# Patient Record
Sex: Female | Born: 1950
Health system: Southern US, Community
[De-identification: ages and names within clinical notes are randomized; demographics above are authoritative.]

## PROBLEM LIST (undated history)

## (undated) DIAGNOSIS — M21969 Unspecified acquired deformity of unspecified lower leg: Secondary | ICD-10-CM

## (undated) DIAGNOSIS — T7840XA Allergy, unspecified, initial encounter: Secondary | ICD-10-CM

## (undated) DIAGNOSIS — M7730 Calcaneal spur, unspecified foot: Secondary | ICD-10-CM

## (undated) DIAGNOSIS — E785 Hyperlipidemia, unspecified: Secondary | ICD-10-CM

## (undated) DIAGNOSIS — M1990 Unspecified osteoarthritis, unspecified site: Secondary | ICD-10-CM

## (undated) DIAGNOSIS — I10 Essential (primary) hypertension: Secondary | ICD-10-CM

## (undated) HISTORY — DX: Hyperlipidemia, unspecified: E78.5

## (undated) HISTORY — DX: Unspecified osteoarthritis, unspecified site: M19.90

## (undated) HISTORY — DX: Essential (primary) hypertension: I10

## (undated) HISTORY — PX: OTHER SURGICAL HISTORY: SHX169

## (undated) HISTORY — DX: Calcaneal spur, unspecified foot: M77.30

## (undated) HISTORY — DX: Unspecified acquired deformity of unspecified lower leg: M21.969

## (undated) HISTORY — DX: Allergy, unspecified, initial encounter: T78.40XA

---

## 1998-04-03 ENCOUNTER — Other Ambulatory Visit: Admission: RE | Admit: 1998-04-03 | Discharge: 1998-04-03 | Payer: Self-pay | Admitting: Obstetrics and Gynecology

## 1999-09-13 ENCOUNTER — Other Ambulatory Visit: Admission: RE | Admit: 1999-09-13 | Discharge: 1999-09-13 | Payer: Self-pay | Admitting: Obstetrics and Gynecology

## 2001-06-29 ENCOUNTER — Other Ambulatory Visit: Admission: RE | Admit: 2001-06-29 | Discharge: 2001-06-29 | Payer: Self-pay | Admitting: Neurosurgery

## 2002-10-15 ENCOUNTER — Other Ambulatory Visit: Admission: RE | Admit: 2002-10-15 | Discharge: 2002-10-15 | Payer: Self-pay | Admitting: Internal Medicine

## 2003-10-03 ENCOUNTER — Encounter: Payer: Self-pay | Admitting: Internal Medicine

## 2003-10-20 ENCOUNTER — Other Ambulatory Visit: Admission: RE | Admit: 2003-10-20 | Discharge: 2003-10-20 | Payer: Self-pay | Admitting: Internal Medicine

## 2004-10-24 ENCOUNTER — Ambulatory Visit: Payer: Self-pay | Admitting: Internal Medicine

## 2004-11-01 ENCOUNTER — Ambulatory Visit: Payer: Self-pay | Admitting: Internal Medicine

## 2005-01-31 ENCOUNTER — Other Ambulatory Visit: Admission: RE | Admit: 2005-01-31 | Discharge: 2005-01-31 | Payer: Self-pay | Admitting: Internal Medicine

## 2005-01-31 ENCOUNTER — Ambulatory Visit: Payer: Self-pay | Admitting: Internal Medicine

## 2005-02-08 ENCOUNTER — Encounter: Admission: RE | Admit: 2005-02-08 | Discharge: 2005-02-08 | Payer: Self-pay | Admitting: Internal Medicine

## 2005-02-19 ENCOUNTER — Encounter: Admission: RE | Admit: 2005-02-19 | Discharge: 2005-02-19 | Payer: Self-pay | Admitting: Internal Medicine

## 2005-05-08 ENCOUNTER — Ambulatory Visit: Payer: Self-pay | Admitting: Internal Medicine

## 2005-08-30 ENCOUNTER — Encounter: Admission: RE | Admit: 2005-08-30 | Discharge: 2005-08-30 | Payer: Self-pay | Admitting: Internal Medicine

## 2005-11-20 ENCOUNTER — Ambulatory Visit: Payer: Self-pay | Admitting: Internal Medicine

## 2005-12-04 ENCOUNTER — Other Ambulatory Visit: Admission: RE | Admit: 2005-12-04 | Discharge: 2005-12-04 | Payer: Self-pay | Admitting: Internal Medicine

## 2005-12-04 ENCOUNTER — Encounter: Payer: Self-pay | Admitting: Internal Medicine

## 2005-12-04 ENCOUNTER — Ambulatory Visit: Payer: Self-pay | Admitting: Internal Medicine

## 2006-01-24 ENCOUNTER — Ambulatory Visit: Payer: Self-pay | Admitting: Internal Medicine

## 2006-02-17 ENCOUNTER — Encounter: Payer: Self-pay | Admitting: Internal Medicine

## 2006-02-17 ENCOUNTER — Ambulatory Visit: Payer: Self-pay | Admitting: Internal Medicine

## 2006-02-17 LAB — HM COLONOSCOPY

## 2006-05-15 ENCOUNTER — Ambulatory Visit: Payer: Self-pay | Admitting: Internal Medicine

## 2006-07-09 ENCOUNTER — Ambulatory Visit: Payer: Self-pay | Admitting: Internal Medicine

## 2006-07-21 IMAGING — US US TRANSVAGINAL NON-OB
1 series · 14 of 25 positions shown · non-contrast
Comparison: none

CLINICAL DATA: History of lobular uterus on CT suggestive of fibroid. 
 TRANSABDOMINAL AND TRANSVAGINAL PELVIC ULTRASOUND:
TECHNIQUE: Both transabdominal and transvaginal ultrasound examinations of the pelvis were performed including evaluation of the uterus, ovaries, adnexal regions, and pelvic cul-de-sac.

[Series 1: unknown · 0.32mm/px · 14 of 41 slices shown]
[im 1/41]
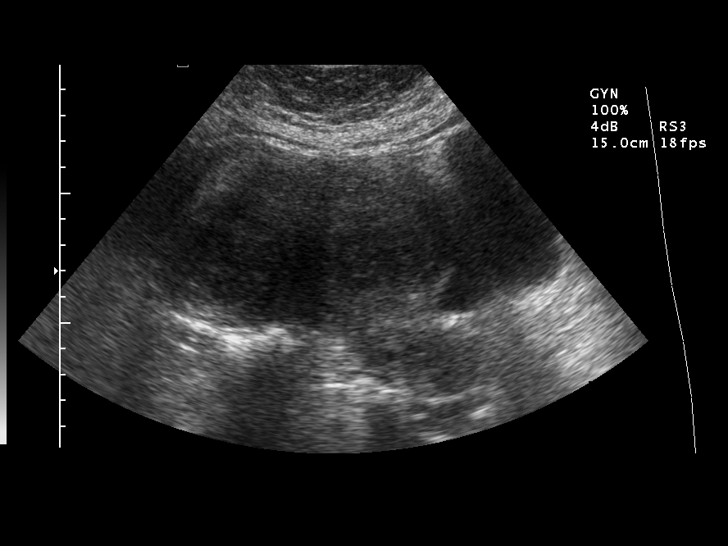
[im 4/41]
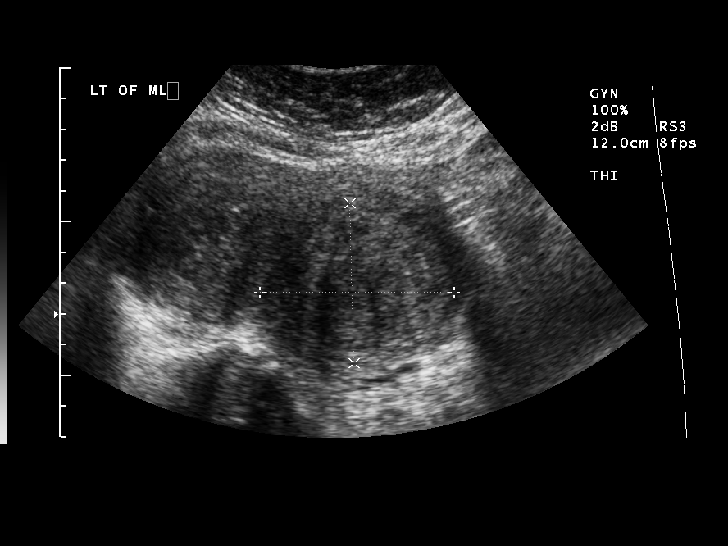
[im 7/41]
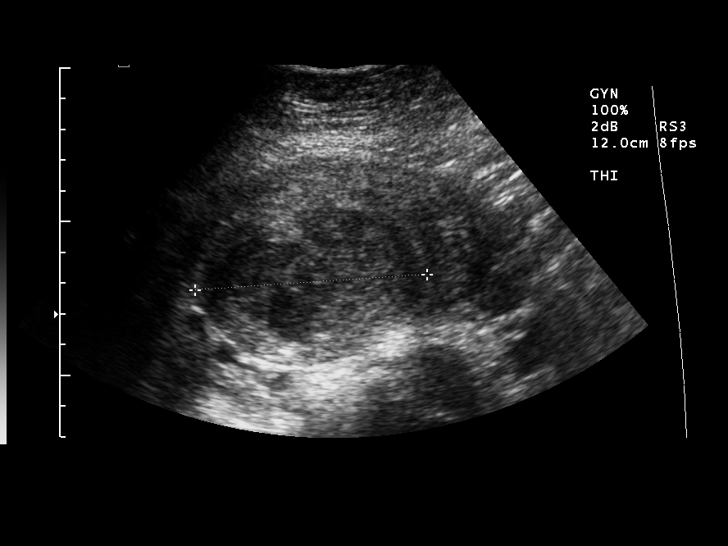
[im 11/41]
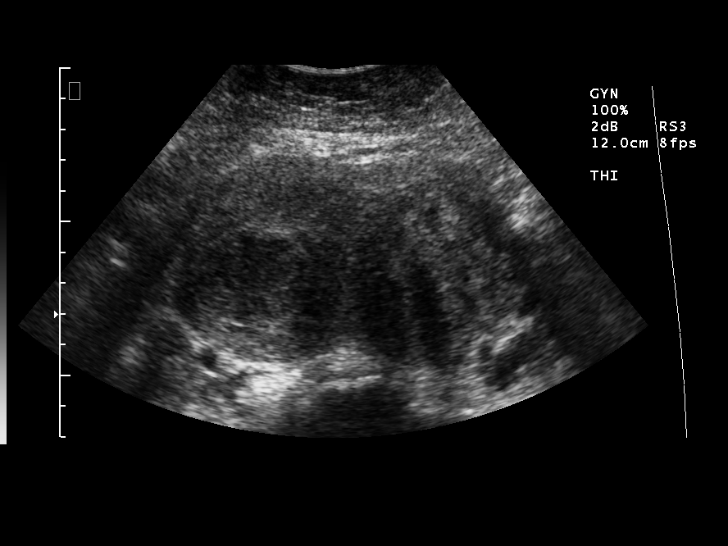
[im 14/41]
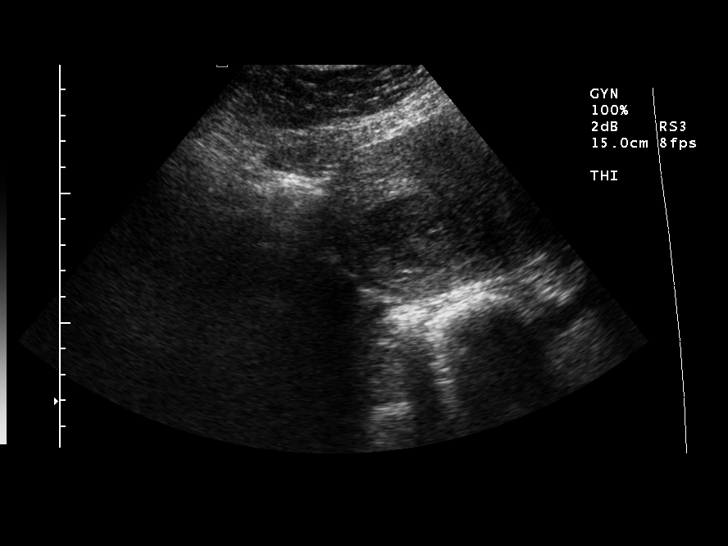
[im 16/41]
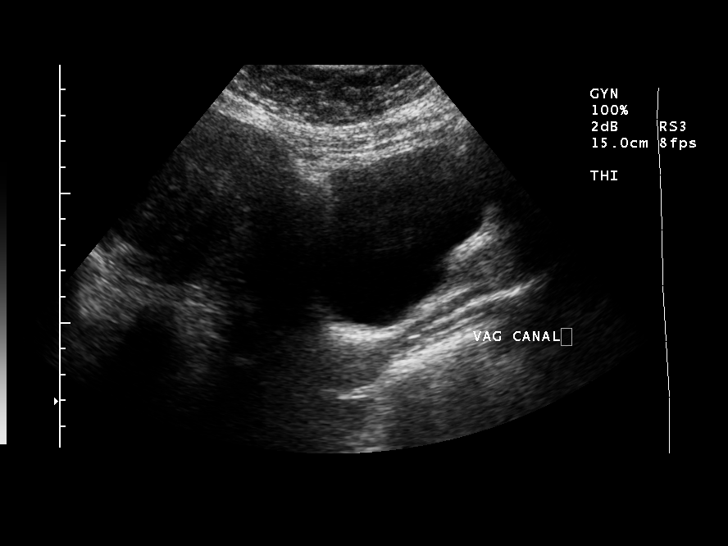
[im 19/41]
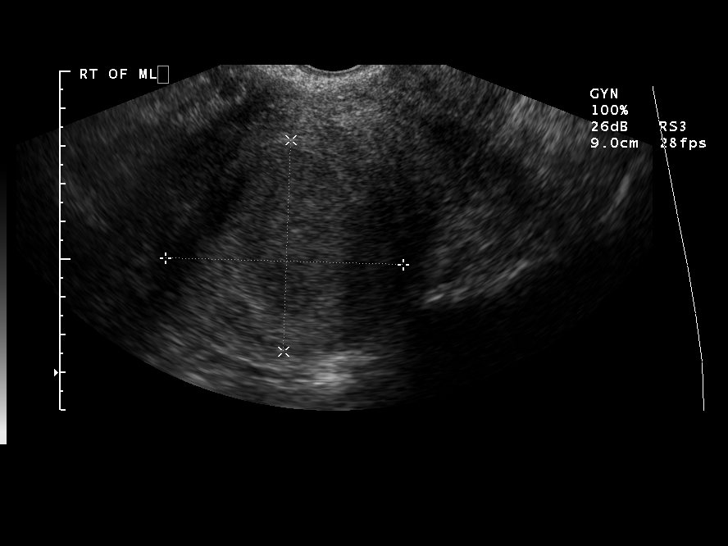
[im 22/41]
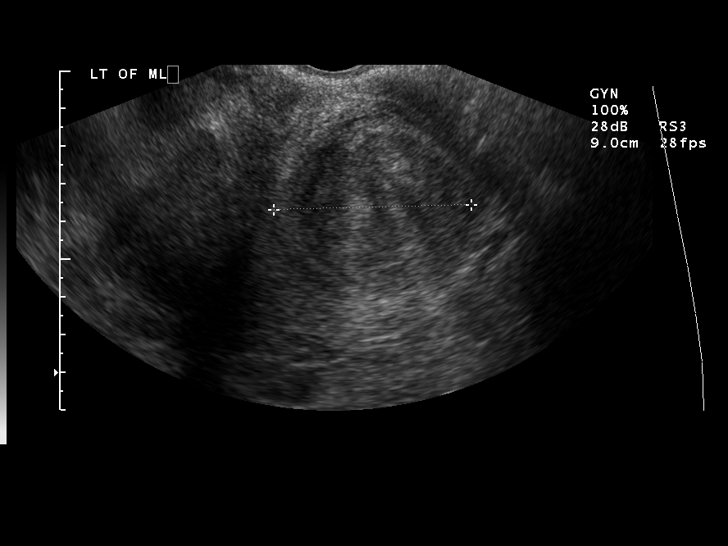
[im 26/41]
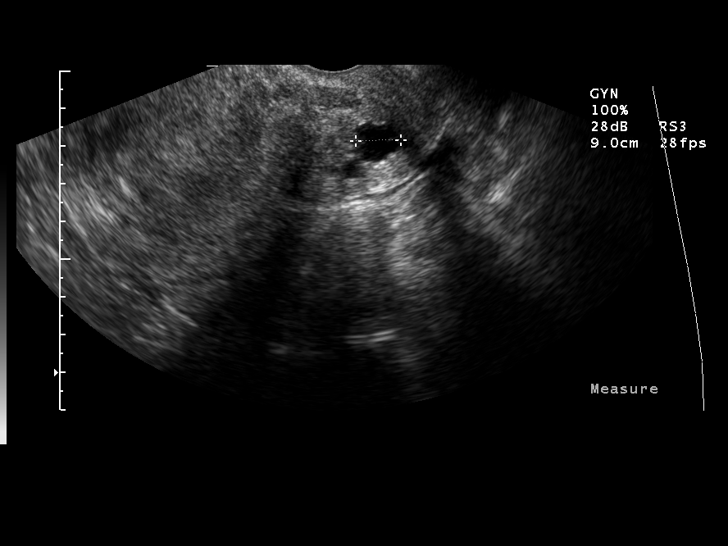
[im 27/41]
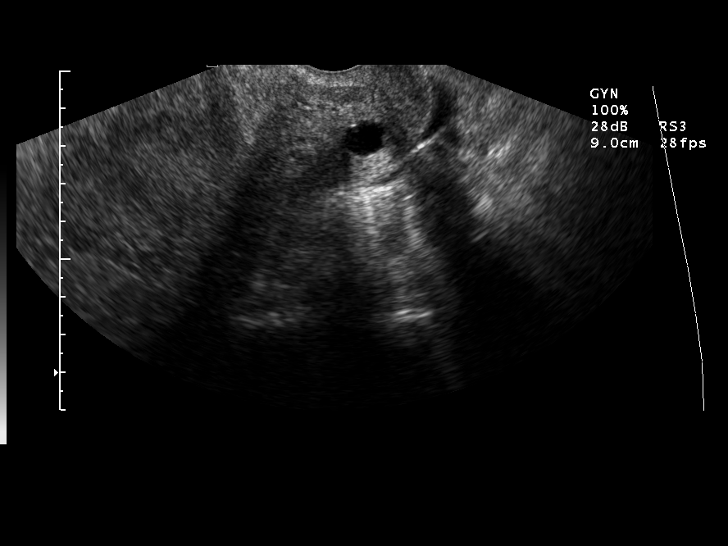
[im 31/41]
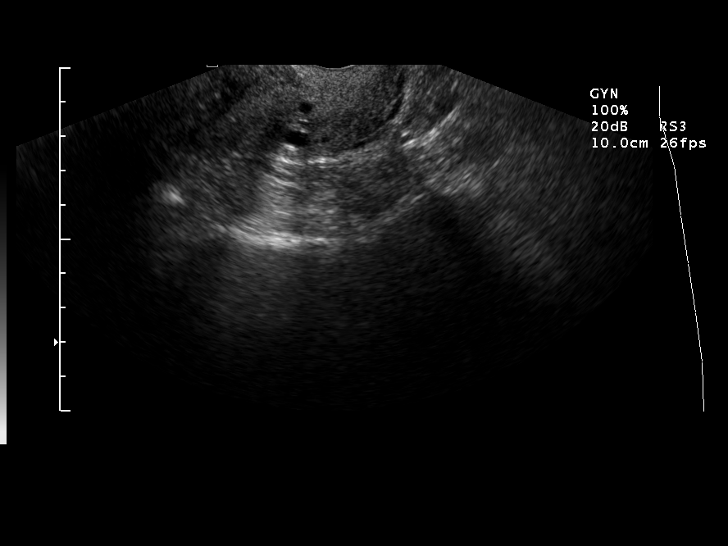
[im 34/41]
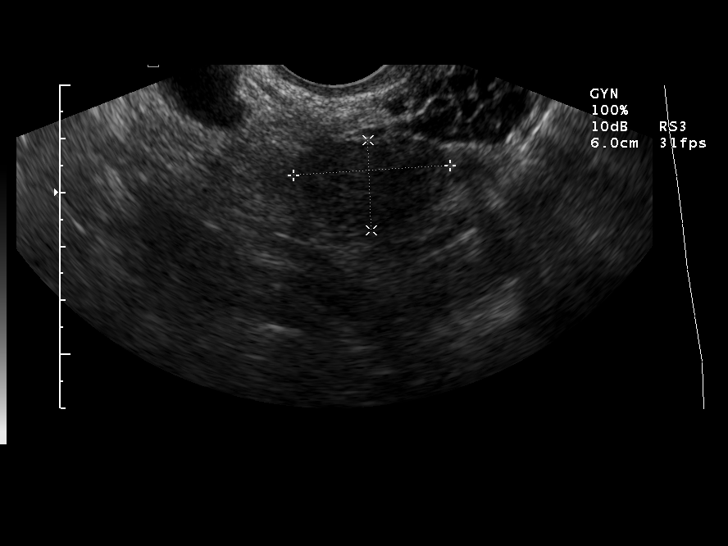
[im 37/41]
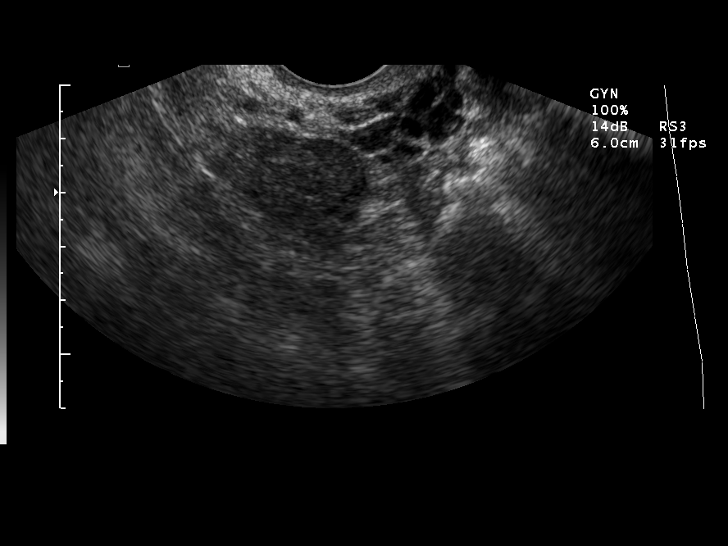
[im 41/41]
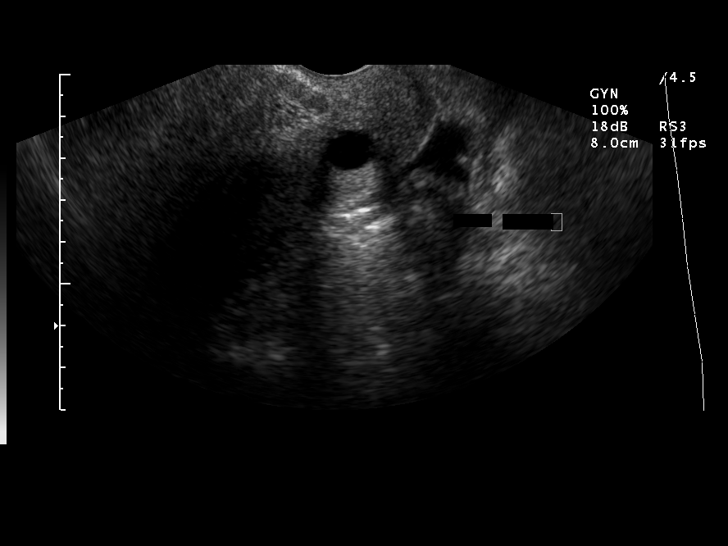

[14 of 25 positions shown; findings below may reference images not displayed]

FINDINGS: The uterus is noted to be enlarged and lobular.  The uterus measures 13.7 cm sagittally with a depth of 7.3 cm and width of 11.2 cm.  At least two fibroids are present.  One of these fibroids is in the left lateral body measuring 6.1 x 5.4 x 5.2 cm with another in the right fundus measuring 6.3 x 5.6 x 6.2 cm.  Endometrium is grossly normal measuring 8.5 mm.  The ovaries are normal in size and only a small amount of free fluid is noted.
IMPRESSION: At least two uterine fibroids as described above.  Ovaries appear normal.

## 2006-08-20 ENCOUNTER — Ambulatory Visit: Payer: Self-pay | Admitting: Internal Medicine

## 2007-01-12 ENCOUNTER — Ambulatory Visit: Payer: Self-pay | Admitting: Internal Medicine

## 2007-01-12 LAB — CONVERTED CEMR LAB
AST: 15 units/L (ref 0–37)
Albumin: 4.1 g/dL (ref 3.5–5.2)
Alkaline Phosphatase: 40 units/L (ref 39–117)
BUN: 13 mg/dL (ref 6–23)
Basophils Absolute: 0 10*3/uL (ref 0.0–0.1)
Bilirubin, Direct: 0.1 mg/dL (ref 0.0–0.3)
Calcium: 9.3 mg/dL (ref 8.4–10.5)
Chloride: 110 meq/L (ref 96–112)
HCT: 40.5 % (ref 36.0–46.0)
HDL: 66.9 mg/dL (ref >39.0)
Hemoglobin: 13.9 g/dL (ref 12.0–15.0)
Lymphocytes Relative: 24.9 % (ref 12.0–46.0)
MCV: 91.8 fL (ref 78.0–100.0)
Neutro Abs: 3.6 10*3/uL (ref 1.4–7.7)
Platelets: 194 10*3/uL (ref 150–400)
Sodium: 144 meq/L (ref 135–145)
Total Protein: 6.6 g/dL (ref 6.0–8.3)
Triglycerides: 164 mg/dL — ABNORMAL HIGH (ref 0–149)
WBC: 5.6 10*3/uL (ref 4.5–10.5)

## 2007-01-19 ENCOUNTER — Other Ambulatory Visit: Admission: RE | Admit: 2007-01-19 | Discharge: 2007-01-19 | Payer: Self-pay | Admitting: Neurosurgery

## 2007-01-19 ENCOUNTER — Encounter: Payer: Self-pay | Admitting: Internal Medicine

## 2007-01-19 ENCOUNTER — Ambulatory Visit: Payer: Self-pay | Admitting: Internal Medicine

## 2007-02-26 DIAGNOSIS — E785 Hyperlipidemia, unspecified: Secondary | ICD-10-CM | POA: Insufficient documentation

## 2007-04-14 ENCOUNTER — Ambulatory Visit: Payer: Self-pay | Admitting: Internal Medicine

## 2007-04-14 LAB — CONVERTED CEMR LAB
Direct LDL: 130.7 mg/dL
HDL: 66 mg/dL (ref >39.0)
Triglycerides: 47 mg/dL (ref 0–149)

## 2007-04-21 ENCOUNTER — Ambulatory Visit: Payer: Self-pay | Admitting: Internal Medicine

## 2007-04-21 DIAGNOSIS — I10 Essential (primary) hypertension: Secondary | ICD-10-CM | POA: Insufficient documentation

## 2007-04-21 LAB — CONVERTED CEMR LAB: Cholesterol, target level: 200 mg/dL

## 2007-05-01 ENCOUNTER — Telehealth: Payer: Self-pay | Admitting: Internal Medicine

## 2007-05-04 ENCOUNTER — Telehealth: Payer: Self-pay | Admitting: *Deleted

## 2007-05-05 ENCOUNTER — Ambulatory Visit: Payer: Self-pay | Admitting: Internal Medicine

## 2007-05-05 DIAGNOSIS — J309 Allergic rhinitis, unspecified: Secondary | ICD-10-CM | POA: Insufficient documentation

## 2007-07-03 ENCOUNTER — Ambulatory Visit: Payer: Self-pay | Admitting: Internal Medicine

## 2007-07-03 LAB — CONVERTED CEMR LAB
Basophils Relative: 0.3 % (ref 0.0–1.0)
MCHC: 34.9 g/dL (ref 30.0–36.0)
MCV: 90.1 fL (ref 78.0–100.0)
Monocytes Absolute: 0.5 10*3/uL (ref 0.2–0.7)
Monocytes Relative: 9.7 % (ref 3.0–11.0)
Neutro Abs: 3.3 10*3/uL (ref 1.4–7.7)
Platelets: 165 10*3/uL (ref 150–400)
RDW: 12.2 % (ref 11.5–14.6)
WBC: 5.5 10*3/uL (ref 4.5–10.5)

## 2007-08-26 ENCOUNTER — Ambulatory Visit: Payer: Self-pay | Admitting: Internal Medicine

## 2007-10-07 ENCOUNTER — Ambulatory Visit: Payer: Self-pay | Admitting: Internal Medicine

## 2007-10-19 ENCOUNTER — Ambulatory Visit: Payer: Self-pay | Admitting: Internal Medicine

## 2007-10-19 LAB — CONVERTED CEMR LAB
Total CHOL/HDL Ratio: 3.3
Triglycerides: 135 mg/dL (ref 0–149)

## 2007-10-26 ENCOUNTER — Ambulatory Visit: Payer: Self-pay | Admitting: Internal Medicine

## 2007-10-26 DIAGNOSIS — M1990 Unspecified osteoarthritis, unspecified site: Secondary | ICD-10-CM | POA: Insufficient documentation

## 2008-03-09 ENCOUNTER — Ambulatory Visit: Payer: Self-pay | Admitting: Internal Medicine

## 2008-03-09 LAB — CONVERTED CEMR LAB
ALT: 16 units/L (ref 0–35)
AST: 16 units/L (ref 0–37)
Albumin: 4.3 g/dL (ref 3.5–5.2)
Basophils Absolute: 0 10*3/uL (ref 0.0–0.1)
Bilirubin, Direct: 0.1 mg/dL (ref 0.0–0.3)
Chloride: 104 meq/L (ref 96–112)
Cholesterol: 233 mg/dL (ref 0–200)
Eosinophils Absolute: 0.2 10*3/uL (ref 0.0–0.7)
Eosinophils Relative: 3.7 % (ref 0.0–5.0)
GFR calc non Af Amer: 79 mL/min
HCT: 39.9 % (ref 36.0–46.0)
MCHC: 35 g/dL (ref 30.0–36.0)
MCV: 91 fL (ref 78.0–100.0)
Neutro Abs: 2.4 10*3/uL (ref 1.4–7.7)
Total CHOL/HDL Ratio: 3.8
Triglycerides: 126 mg/dL (ref 0–149)
VLDL: 25 mg/dL (ref 0–40)
WBC: 4.1 10*3/uL — ABNORMAL LOW (ref 4.5–10.5)
pH: 5

## 2008-03-23 ENCOUNTER — Ambulatory Visit: Payer: Self-pay | Admitting: Internal Medicine

## 2008-03-23 ENCOUNTER — Other Ambulatory Visit: Admission: RE | Admit: 2008-03-23 | Discharge: 2008-03-23 | Payer: Self-pay | Admitting: Internal Medicine

## 2008-03-23 ENCOUNTER — Encounter: Payer: Self-pay | Admitting: Internal Medicine

## 2008-04-25 ENCOUNTER — Encounter: Payer: Self-pay | Admitting: Internal Medicine

## 2008-06-29 ENCOUNTER — Encounter: Payer: Self-pay | Admitting: Internal Medicine

## 2008-08-02 ENCOUNTER — Ambulatory Visit: Payer: Self-pay | Admitting: Internal Medicine

## 2008-08-02 LAB — CONVERTED CEMR LAB
AST: 25 units/L (ref 0–37)
Bilirubin, Direct: 0.1 mg/dL (ref 0.0–0.3)
Cholesterol: 266 mg/dL (ref 0–200)
Direct LDL: 167.6 mg/dL
Total CHOL/HDL Ratio: 3.8
VLDL: 26 mg/dL (ref 0–40)

## 2008-08-09 ENCOUNTER — Ambulatory Visit: Payer: Self-pay | Admitting: Internal Medicine

## 2008-10-04 ENCOUNTER — Ambulatory Visit: Payer: Self-pay | Admitting: Internal Medicine

## 2008-11-08 ENCOUNTER — Ambulatory Visit: Payer: Self-pay | Admitting: Internal Medicine

## 2008-11-08 LAB — CONVERTED CEMR LAB
Albumin: 4.1 g/dL (ref 3.5–5.2)
Alkaline Phosphatase: 29 units/L — ABNORMAL LOW (ref 39–117)
Cholesterol: 229 mg/dL — ABNORMAL HIGH (ref 0–200)
Direct LDL: 149.9 mg/dL
HDL: 64.2 mg/dL (ref >39.00)
Total CHOL/HDL Ratio: 4
VLDL: 15.8 mg/dL (ref 0.0–40.0)
Vit D, 25-Hydroxy: 32 ng/mL (ref 30–89)

## 2008-11-15 ENCOUNTER — Encounter: Payer: Self-pay | Admitting: Internal Medicine

## 2008-11-15 ENCOUNTER — Ambulatory Visit: Payer: Self-pay | Admitting: Internal Medicine

## 2008-11-17 ENCOUNTER — Encounter: Payer: Self-pay | Admitting: Internal Medicine

## 2009-03-22 ENCOUNTER — Ambulatory Visit: Payer: Self-pay | Admitting: Internal Medicine

## 2009-03-22 LAB — CONVERTED CEMR LAB
Albumin: 4.3 g/dL (ref 3.5–5.2)
Basophils Absolute: 0 10*3/uL (ref 0.0–0.1)
Eosinophils Absolute: 0.2 10*3/uL (ref 0.0–0.7)
Glucose, Urine, Semiquant: NEGATIVE
HCT: 39 % (ref 36.0–46.0)
HDL: 62.5 mg/dL (ref >39.00)
Hemoglobin: 13.6 g/dL (ref 12.0–15.0)
Ketones, urine, test strip: NEGATIVE
Lymphocytes Relative: 24.3 % (ref 12.0–46.0)
MCV: 91.7 fL (ref 78.0–100.0)
Neutro Abs: 2.7 10*3/uL (ref 1.4–7.7)
Neutrophils Relative %: 63.6 % (ref 43.0–77.0)
Platelets: 178 10*3/uL (ref 150.0–400.0)
Potassium: 3.5 meq/L (ref 3.5–5.1)
RBC: 4.25 M/uL (ref 3.87–5.11)
Sodium: 144 meq/L (ref 135–145)
Total Bilirubin: 1.2 mg/dL (ref 0.3–1.2)
Total CHOL/HDL Ratio: 4
Total Protein: 6.8 g/dL (ref 6.0–8.3)
Triglycerides: 104 mg/dL (ref 0.0–149.0)
Urobilinogen, UA: 0.2
VLDL: 20.8 mg/dL (ref 0.0–40.0)
WBC: 4.3 10*3/uL — ABNORMAL LOW (ref 4.5–10.5)

## 2009-04-04 ENCOUNTER — Other Ambulatory Visit: Admission: RE | Admit: 2009-04-04 | Discharge: 2009-04-04 | Payer: Self-pay | Admitting: Neurosurgery

## 2009-04-04 ENCOUNTER — Encounter: Payer: Self-pay | Admitting: Internal Medicine

## 2009-04-04 ENCOUNTER — Ambulatory Visit: Payer: Self-pay | Admitting: Internal Medicine

## 2009-05-01 ENCOUNTER — Encounter: Payer: Self-pay | Admitting: Internal Medicine

## 2009-05-08 ENCOUNTER — Telehealth: Payer: Self-pay | Admitting: Internal Medicine

## 2009-05-10 ENCOUNTER — Ambulatory Visit: Payer: Self-pay | Admitting: Internal Medicine

## 2009-06-14 ENCOUNTER — Ambulatory Visit: Payer: Self-pay | Admitting: Internal Medicine

## 2009-06-14 LAB — CONVERTED CEMR LAB
ALT: 18 units/L (ref 0–35)
Alkaline Phosphatase: 39 units/L (ref 39–117)
Bilirubin, Direct: 0 mg/dL (ref 0.0–0.3)
Total Bilirubin: 0.6 mg/dL (ref 0.3–1.2)
Total CHOL/HDL Ratio: 3
Total Protein: 6.7 g/dL (ref 6.0–8.3)
Triglycerides: 70 mg/dL (ref 0.0–149.0)

## 2009-06-22 ENCOUNTER — Ambulatory Visit: Payer: Self-pay | Admitting: Internal Medicine

## 2009-07-21 ENCOUNTER — Ambulatory Visit: Payer: Self-pay | Admitting: Internal Medicine

## 2009-07-21 DIAGNOSIS — J329 Chronic sinusitis, unspecified: Secondary | ICD-10-CM | POA: Insufficient documentation

## 2009-10-04 ENCOUNTER — Ambulatory Visit: Payer: Self-pay | Admitting: Internal Medicine

## 2010-03-29 ENCOUNTER — Ambulatory Visit: Payer: Self-pay | Admitting: Internal Medicine

## 2010-03-29 LAB — CONVERTED CEMR LAB
AST: 15 units/L (ref 0–37)
BUN: 23 mg/dL (ref 6–23)
Basophils Relative: 0.6 % (ref 0.0–3.0)
Bilirubin Urine: NEGATIVE
Blood in Urine, dipstick: NEGATIVE
Calcium: 9.3 mg/dL (ref 8.4–10.5)
Cholesterol: 199 mg/dL (ref 0–200)
Eosinophils Relative: 3.3 % (ref 0.0–5.0)
GFR calc non Af Amer: 84.13 mL/min (ref >60)
Glucose, Bld: 88 mg/dL (ref 70–99)
Hemoglobin: 12.9 g/dL (ref 12.0–15.0)
Ketones, urine, test strip: NEGATIVE
Lymphs Abs: 1.6 10*3/uL (ref 0.7–4.0)
MCHC: 33.9 g/dL (ref 30.0–36.0)
Monocytes Absolute: 0.4 10*3/uL (ref 0.1–1.0)
Monocytes Relative: 8.7 % (ref 3.0–12.0)
Neutrophils Relative %: 55.8 % (ref 43.0–77.0)
Platelets: 180 10*3/uL (ref 150.0–400.0)
Potassium: 4.3 meq/L (ref 3.5–5.1)
RDW: 12.9 % (ref 11.5–14.6)
Sodium: 137 meq/L (ref 135–145)
Specific Gravity, Urine: 1.01
Total Protein: 6.3 g/dL (ref 6.0–8.3)
Triglycerides: 63 mg/dL (ref 0.0–149.0)
Urobilinogen, UA: 0.2
VLDL: 12.6 mg/dL (ref 0.0–40.0)

## 2010-04-05 ENCOUNTER — Other Ambulatory Visit: Admission: RE | Admit: 2010-04-05 | Discharge: 2010-04-05 | Payer: Self-pay | Admitting: Internal Medicine

## 2010-04-05 ENCOUNTER — Ambulatory Visit: Payer: Self-pay | Admitting: Internal Medicine

## 2010-04-05 LAB — HM PAP SMEAR

## 2010-05-07 LAB — HM MAMMOGRAPHY

## 2010-05-31 ENCOUNTER — Ambulatory Visit: Payer: Self-pay | Admitting: Internal Medicine

## 2010-06-12 ENCOUNTER — Encounter: Payer: Self-pay | Admitting: Internal Medicine

## 2010-08-26 ENCOUNTER — Encounter: Payer: Self-pay | Admitting: Internal Medicine

## 2010-09-02 LAB — CONVERTED CEMR LAB: Pap Smear: NEGATIVE

## 2010-09-06 NOTE — Assessment & Plan Note (Signed)
Summary: cpx/njr rsc appt time/njr   Vital Signs:  Patient profile:   60 year old female Menstrual status:  postmenopausal Height:      63 inches Weight:      166 pounds BMI:     29.51 Temp:     98.2 degrees F oral Pulse rate:   50 / minute Resp:     14 per minute BP sitting:   120 / 76  (left arm)  Vitals Entered By: Willy Eddy, LPN (April 05, 2010 9:28 AM) CC: Hypertension Management Is Patient Diabetic? No   Primary Care Provider:  Darryll Capers  CC:  Hypertension Management.  History of Present Illness: decreased constipation but has noted hemorrhoids have flaired The pt was asked about all immunizations, health maint. services that are appropriate to their age and was given guidance on diet exercize  and weight management   Hypertension History:      She denies headache, chest pain, palpitations, dyspnea with exertion, orthopnea, PND, peripheral edema, visual symptoms, neurologic problems, syncope, and side effects from treatment.        Positive major cardiovascular risk factors include female age 46 years old or older, hyperlipidemia, hypertension, and family history for ischemic heart disease (females less than 80 years old).  Negative major cardiovascular risk factors include no history of diabetes and non-tobacco-user status.        Further assessment for target organ damage reveals no history of ASHD, stroke/TIA, or peripheral vascular disease.     Preventive Screening-Counseling & Management  Alcohol-Tobacco     Smoking Status: quit     Packs/Day: 1.5     Year Started: 1980     Year Quit: 2000     Pack years: 10  1 1/2 packs daily  Problems Prior to Update: 1)  Asthma  (ICD-493.90) 2)  Sinusitis  (ICD-473.9) 3)  Uri  (ICD-465.9) 4)  Loc Osteoarthros Not Spec Whether Prim/sec Hand  (ICD-715.34) 5)  Osteopenia  (ICD-733.90) 6)  Preventive Health Care  (ICD-V70.0) 7)  Osteoarthritis  (ICD-715.90) 8)  Allergic Rhinitis  (ICD-477.9) 9)   Bronchitis, Acute  (ICD-466.0) 10)  Family History of Cad Female 1st Degree Relative <60  (ICD-V16.49) 11)  Hypertension  (ICD-401.9) 12)  Hyperlipidemia  (ICD-272.4)  Current Problems (verified): 1)  Asthma  (ICD-493.90) 2)  Sinusitis  (ICD-473.9) 3)  Uri  (ICD-465.9) 4)  Loc Osteoarthros Not Spec Whether Prim/sec Hand  (ICD-715.34) 5)  Osteopenia  (ICD-733.90) 6)  Preventive Health Care  (ICD-V70.0) 7)  Osteoarthritis  (ICD-715.90) 8)  Allergic Rhinitis  (ICD-477.9) 9)  Bronchitis, Acute  (ICD-466.0) 10)  Family History of Cad Female 1st Degree Relative <60  (ICD-V16.49) 11)  Hypertension  (ICD-401.9) 12)  Hyperlipidemia  (ICD-272.4)  Medications Prior to Update: 1)  Bisoprolol-Hydrochlorothiazide 5-6.25 Mg Tabs (Bisoprolol-Hydrochlorothiazide) .... Once Daily 2)  Bayer Aspirin 325 Mg  Tabs (Aspirin) .... Once Daily 3)  Lipitor 40 Mg Tabs (Atorvastatin Calcium) .... One By Mouth  Every Sunday Night 4)  Calcium 500/d 500-125 Mg-Unit  Tabs (Calcium Carbonate-Vitamin D) .... Once Daily 5)  Retin-A 0.025 %  Crea (Tretinoin) .... As Directed Daily  To Face 6)  Flonase 50 Mcg/act Susp (Fluticasone Propionate) .Marland Kitchen.. 1-2 Sprays in Each Nostril Daily 7)  Fish Oil Maximum Strength 1200 Mg Caps (Omega-3 Fatty Acids) .... Two By Mouth Two Times A Day 8)  Vitamin D3 1000 Unit Caps (Cholecalciferol) .... One By Mouth Daily 9)  Proair Hfa 108 (90 Base) Mcg/act Aers (  Albuterol Sulfate) .... 2 Puffs Four Times A Day As Needed 10)  Naproxen 500 Mg Tabs (Naproxen) .... One By Mouth Bid 11)  Singulair 10 Mg Tabs (Montelukast Sodium) .... Take 1 By Mouth Once Daily As Needed  Current Medications (verified): 1)  Bisoprolol-Hydrochlorothiazide 5-6.25 Mg Tabs (Bisoprolol-Hydrochlorothiazide) .... Once Daily 2)  Bayer Aspirin 325 Mg  Tabs (Aspirin) .... Once Daily 3)  Lipitor 40 Mg Tabs (Atorvastatin Calcium) .... One By Mouth  Every Sunday Night 4)  Calcium 500/d 500-125 Mg-Unit  Tabs (Calcium  Carbonate-Vitamin D) .... Once Daily 5)  Retin-A 0.025 %  Crea (Tretinoin) .... As Directed Daily  To Face 6)  Flonase 50 Mcg/act Susp (Fluticasone Propionate) .Marland Kitchen.. 1-2 Sprays in Each Nostril Daily 7)  Krill Oil 1000 Mg Caps (Krill Oil) .... Two By Mouth Two Times A Day 8)  Vitamin D3 1000 Unit Caps (Cholecalciferol) .... One By Mouth Daily 9)  Proair Hfa 108 (90 Base) Mcg/act Aers (Albuterol Sulfate) .... 2 Puffs Four Times A Day As Needed 10)  Naproxen 500 Mg Tabs (Naproxen) .... One By Mouth Bid 11)  Singulair 10 Mg Tabs (Montelukast Sodium) .... Take 1 By Mouth Once Daily As Needed  Allergies (verified): 1)  ! Sulfa  Past History:  Family History: Last updated: 04/21/2007 Fam hx CVA Fam hx MI Family History of CAD Female 1st degree relative <60 Family History of Stroke F 1st degree relative <60  Social History: Last updated: 10/07/2007 Former Smoker Alcohol use-yes Married Teaches high school  Risk Factors: Smoking Status: quit (04/05/2010) Packs/Day: 1.5 (04/05/2010)  Past medical, surgical, family and social histories (including risk factors) reviewed, and no changes noted (except as noted below).  Past Medical History: Reviewed history from 10/26/2007 and no changes required. Hyperlipidemia Migraine Headaches Heel Spur Hypertension Allergic rhinitis- skin test positive 1/09. Osteoarthritis  Past Surgical History: Reviewed history from 04/21/2007 and no changes required. Denies surgical history  Family History: Reviewed history from 04/21/2007 and no changes required. Fam hx CVA Fam hx MI Family History of CAD Female 1st degree relative <60 Family History of Stroke F 1st degree relative <60  Social History: Reviewed history from 10/07/2007 and no changes required. Former Smoker Alcohol use-yes Married Teaches high school  Review of Systems  The patient denies anorexia, fever, weight loss, weight gain, vision loss, decreased hearing, hoarseness,  chest pain, syncope, dyspnea on exertion, peripheral edema, prolonged cough, headaches, hemoptysis, abdominal pain, melena, hematochezia, severe indigestion/heartburn, hematuria, incontinence, genital sores, muscle weakness, suspicious skin lesions, transient blindness, difficulty walking, depression, unusual weight change, abnormal bleeding, enlarged lymph nodes, angioedema, and breast masses.    Physical Exam  General:  Well-developed,well-nourished,in no acute distress; alert,appropriate and cooperative throughout examination Head:  normocephalic and atraumatic.   Eyes:  vision grossly intact, pupils equal, and pupils round.   Ears:  R ear normal, L ear normal, and no external deformities.   Nose:  no external deformity and no external erythema.  congested  with few mucoid  discharge  Mouth:  pharynx pink and moist.   no lesions  Neck:  No deformities, masses, or tenderness noted. Lungs:  Normal respiratory effort, chest expands symmetrically. Lungs are clear to auscultation, no crackles or wheezes. Heart:  Normal rate and regular rhythm. S1 and S2 normal without gallop, murmur, click, rub or other extra sounds. Abdomen:  Bowel sounds positive,abdomen soft and non-tender without masses, organomegaly or hernias noted. Msk:  decreased ROM and joint tenderness.  thumb Extremities:  no clubbing cyanosis  or edema  Neurologic:  No cranial nerve deficits noted. Station and gait are normal. Plantar reflexes are down-going bilaterally. DTRs are symmetrical throughout. Sensory, motor and coordinative functions appear intact.   Impression & Recommendations:  Problem # 1:  PREVENTIVE HEALTH CARE (ICD-V70.0) Assessment Improved The pt was asked about all immunizations, health maint. services that are appropriate to their age and was given guidance on diet exercize  and weight management  Mammogram: normal (05/01/2009) Pap smear: NEGATIVE FOR INTRAEPITHELIAL LESIONS OR MALIGNANCY.  (04/04/2009) Colonoscopy: Diverticulosis (02/17/2006) Td Booster: Tdap (12/23/2005)   Flu Vax: Fluvax 3+ (05/10/2009)   Pneumovax: Historical (08/05/2005) Chol: 199 (03/29/2010)   HDL: 63.90 (03/29/2010)   LDL: 123 (03/29/2010)   TG: 63.0 (03/29/2010) TSH: 1.94 (03/29/2010)   Next mammogram due:: 05/2010 (05/10/2009)  Discussed using sunscreen, use of alcohol, drug use, self breast exam, routine dental care, routine eye care, schedule for GYN exam, routine physical exam, seat belts, multiple vitamins, osteoporosis prevention, adequate calcium intake in diet, recommendations for immunizations, mammograms and Pap smears.  Discussed exercise and checking cholesterol.  Discussed gun safety, safe sex, and contraception.  Problem # 2:  HYPERTENSION (ICD-401.9)  Her updated medication list for this problem includes:    Bisoprolol-hydrochlorothiazide 5-6.25 Mg Tabs (Bisoprolol-hydrochlorothiazide) ..... Once daily  Orders: EKG w/ Interpretation (93000)  BP today: 120/76 Prior BP: 118/78 (10/04/2009)  10 Yr Risk Heart Disease: 6 % Prior 10 Yr Risk Heart Disease: Not enough information (11/15/2008)  Labs Reviewed: K+: 4.3 (03/29/2010) Creat: : 0.8 (03/29/2010)   Chol: 199 (03/29/2010)   HDL: 63.90 (03/29/2010)   LDL: 123 (03/29/2010)   TG: 63.0 (03/29/2010)  Problem # 3:  HYPERLIPIDEMIA (ICD-272.4)  at goal Her updated medication list for this problem includes:    Lipitor 40 Mg Tabs (Atorvastatin calcium) ..... One by mouth  every sunday night  Labs Reviewed: SGOT: 15 (03/29/2010)   SGPT: 16 (03/29/2010)  Lipid Goals: Chol Goal: 200 (04/21/2007)   HDL Goal: 40 (04/21/2007)   LDL Goal: 130 (04/21/2007)   TG Goal: 150 (04/21/2007)  10 Yr Risk Heart Disease: 6 % Prior 10 Yr Risk Heart Disease: Not enough information (11/15/2008)   HDL:63.90 (03/29/2010), 68.60 (06/14/2009)  LDL:123 (03/29/2010), DEL (08/02/2008)  Chol:199 (03/29/2010), 213 (06/14/2009)  Trig:63.0 (03/29/2010), 70.0  (06/14/2009)  Complete Medication List: 1)  Bisoprolol-hydrochlorothiazide 5-6.25 Mg Tabs (Bisoprolol-hydrochlorothiazide) .... Once daily 2)  Bayer Aspirin 325 Mg Tabs (Aspirin) .... Once daily 3)  Lipitor 40 Mg Tabs (Atorvastatin calcium) .... One by mouth  every sunday night 4)  Calcium 500/d 500-125 Mg-unit Tabs (Calcium carbonate-vitamin d) .... Once daily 5)  Retin-a 0.025 % Crea (Tretinoin) .... As directed daily  to face 6)  Flonase 50 Mcg/act Susp (Fluticasone propionate) .Marland Kitchen.. 1-2 sprays in each nostril daily 7)  Krill Oil 1000 Mg Caps (Krill oil) .... Two by mouth two times a day 8)  Vitamin D3 1000 Unit Caps (Cholecalciferol) .... One by mouth daily 9)  Proair Hfa 108 (90 Base) Mcg/act Aers (Albuterol sulfate) .... 2 puffs four times a day as needed 10)  Naproxen 500 Mg Tabs (Naproxen) .... One by mouth bid 11)  Singulair 10 Mg Tabs (Montelukast sodium) .... Take 1 by mouth once daily as needed  Hypertension Assessment/Plan:      The patient's hypertensive risk group is category B: At least one risk factor (excluding diabetes) with no target organ damage.  Her calculated 10 year risk of coronary heart disease is 6 %.  Today's blood pressure is  120/76.  Her blood pressure goal is < 140/90.  Patient Instructions: 1)  Please schedule a follow-up appointment in 6 months. 2)  Hepatic Panel prior to visit, ICD-9:995.20 3)  Lipid Panel prior to visit, ICD-9:272.4

## 2010-09-06 NOTE — Assessment & Plan Note (Signed)
Summary: 12 months/apc   Primary Provider/Referring Provider:  Darryll Capers  CC:  Yearly follow up visitp-no complaints.  History of Present Illness:  10/07/07- Had a good winter. The February problems she usually expects didn't occur. She got new pillow and encasings and is washing sheets in hot water as recomended. She is using Nasacort daily without epistaxis but has not felt she needed to use Qvar. Skin testing 1/09 had been significantlypositive especially for grass, weeds, trees and dust mite. She has been educated on environmental precautions. Uses husband's allegra.  10/04/08- Allergic rhinitis Some itching around eyes last Fall- allegra as needed has been sufficient. Good winter. Never on allergy vaccine. Sick exposure at colds. Expects to sick in February with drip then persistent cough lasting months and similar drip and nasal stuffiness just noted in last day or so. Some eustavchian type discomfort with pressure and nose in the right ear. denies real sore throat, fever.  October 04, 2009- Allergic rhinitis, asthma For another year she has gotten through February without a flare. She  borrows husband's allegra and uses flonase. She has not needed Singulair. She did get a little tight when closed up in a car with 2 dogs for a while. She took an antibiotc and cough syrup briefly thid Fall then decided she didn't need them and stopped early. She asks to continue following here at long intervals but I pointed out Dr Lovell Sheehan can fill her meds if that meets her needs. No exposure to small children. Rarely needs her rescue inhaler Proair.    Current Medications (verified): 1)  Bisoprolol-Hydrochlorothiazide 5-6.25 Mg Tabs (Bisoprolol-Hydrochlorothiazide) .... Once Daily 2)  Bayer Aspirin 325 Mg  Tabs (Aspirin) .... Once Daily 3)  Lipitor 40 Mg Tabs (Atorvastatin Calcium) .... One By Mouth  Every Sunday Night 4)  Calcium 500/d 500-125 Mg-Unit  Tabs (Calcium Carbonate-Vitamin D) .... Once  Daily 5)  Retin-A 0.025 %  Crea (Tretinoin) .... As Directed Daily  To Face 6)  Flonase 50 Mcg/act Susp (Fluticasone Propionate) .... 1-2 Sprays in Each Nostril Daily 7)  Fish Oil Maximum Strength 1200 Mg Caps (Omega-3 Fatty Acids) .... Two By Mouth Two Times A Day 8)  Vitamin D3 1000 Unit Caps (Cholecalciferol) .... One By Mouth Daily 9)  Proair Hfa 108 (90 Base) Mcg/act Aers (Albuterol Sulfate) .... 2 Puffs Four Times A Day As Needed 10)  Naproxen 500 Mg Tabs (Naproxen) .... One By Mouth Bid 11)  Singulair 10 Mg Tabs (Montelukast Sodium) .... Take 1 By Mouth Once Daily As Needed  Allergies (verified): 1)  ! Sulfa  Past History:  Past Medical History: Last updated: 10/26/2007 Hyperlipidemia Migraine Headaches Heel Spur Hypertension Allergic rhinitis- skin test positive 1/09. Osteoarthritis  Past Surgical History: Last updated: 04/21/2007 Denies surgical history  Family History: Last updated: 04/21/2007 Fam hx CVA Fam hx MI Family History of CAD Female 1st degree relative <60 Family History of Stroke F 1st degree relative <60  Social History: Last updated: 10/07/2007 Former Smoker Alcohol use-yes Married Teaches high school  Risk Factors: Smoking Status: quit (07/21/2009) Packs/Day: 1.5 (07/21/2009)  Review of Systems      See HPI  The patient denies anorexia, fever, weight loss, weight gain, vision loss, decreased hearing, hoarseness, chest pain, syncope, dyspnea on exertion, peripheral edema, prolonged cough, headaches, hemoptysis, and severe indigestion/heartburn.    Vital Signs:  Patient profile:   60 year old female Menstrual status:  postmenopausal Height:      63  inches Weight:  171.25 pounds BMI:     30.45 O2 Sat:      99 % on Room air Pulse rate:   55 / minute BP sitting:   118 / 78  (left arm) Cuff size:   regular  Vitals Entered By: Reynaldo Minium CMA (October 04, 2009 9:03 AM)  O2 Flow:  Room air  Physical Exam  Additional Exam:   General: A/Ox3; pleasant and cooperative, NAD, SKIN: no rash, lesions NODES: no lymphadenopathy HEENT: [place]/AT, EOM- WNL, Conjuctivae- clear, PERRLA, TM-WNL- eaqs look clear, Nose- clear, Throat- clear and wnl, Mallampati  III NECK: Supple w/ fair ROM, JVD- none, normal carotid impulses w/o bruits  CHEST: Clear to P&A HEART: RRR, no m/g/r heard Abdomen:- soft, nontender MWU:XLKG, nl pulses, no edema  NEURO: Grossly intact to observation      Impression & Recommendations:  Problem # 1:  ALLERGIC RHINITIS (ICD-477.9)  Mild seasonal rhinitis with viral trigger probably more important than atopy. We are refilling fluticasone. Allegra is going otc. Singulair can be added only if needed. Her updated medication list for this problem includes:    Flonase 50 Mcg/act Susp (Fluticasone propionate) .Marland Kitchen... 1-2 sprays in each nostril daily  Problem # 2:  ASTHMA (ICD-493.90) Minimal intermittent - her rescue inhaler is rarely needed.  Medications Added to Medication List This Visit: 1)  Singulair 10 Mg Tabs (Montelukast sodium) .... Take 1 by mouth once daily as needed  Other Orders: Est. Patient Level III (40102)  Patient Instructions: 1)  Schedule return in one year, earlier if needed 2)  Refilled script for fluticasone/ flonase Prescriptions: FLONASE 50 MCG/ACT SUSP (FLUTICASONE PROPIONATE) 1-2 sprays in each nostril daily  #1 x prn   Entered and Authorized by:   Waymon Budge MD   Signed by:   Waymon Budge MD on 10/04/2009   Method used:   Print then Give to Patient   RxID:   [id number]

## 2010-09-06 NOTE — Assessment & Plan Note (Signed)
Summary: flu shot/cjr   Nurse Visit   Allergies: 1)  ! Sulfa  Review of Systems       Flu Vaccine Consent Questions     Do you have a history of severe allergic reactions to this vaccine? no    Any prior history of allergic reactions to egg and/or gelatin? no    Do you have a sensitivity to the preservative Thimersol? no    Do you have a past history of Guillan-Barre Syndrome? no    Do you currently have an acute febrile illness? no    Have you ever had a severe reaction to latex? no    Vaccine information given and explained to patient? yes    Are you currently pregnant? no    Lot Number:[id number]   Exp Date:02/02/2011   Site Given  Left Deltoid IM    Orders Added: 1)  Admin 1st Vaccine [90471] 2)  Flu Vaccine 30yrs + [91478]

## 2010-09-20 ENCOUNTER — Other Ambulatory Visit: Payer: Self-pay | Admitting: Internal Medicine

## 2010-09-27 ENCOUNTER — Other Ambulatory Visit (INDEPENDENT_AMBULATORY_CARE_PROVIDER_SITE_OTHER): Payer: BC Managed Care – PPO | Admitting: Internal Medicine

## 2010-09-27 DIAGNOSIS — E785 Hyperlipidemia, unspecified: Secondary | ICD-10-CM

## 2010-09-27 DIAGNOSIS — T887XXA Unspecified adverse effect of drug or medicament, initial encounter: Secondary | ICD-10-CM

## 2010-09-27 LAB — HEPATIC FUNCTION PANEL
ALT: 18 U/L (ref 0–35)
AST: 17 U/L (ref 0–37)
Albumin: 4.4 g/dL (ref 3.5–5.2)
Alkaline Phosphatase: 37 U/L — ABNORMAL LOW (ref 39–117)

## 2010-09-27 LAB — LDL CHOLESTEROL, DIRECT: Direct LDL: 131.9 mg/dL

## 2010-09-27 LAB — LIPID PANEL
Total CHOL/HDL Ratio: 3
Triglycerides: 129 mg/dL (ref 0.0–149.0)

## 2010-10-03 ENCOUNTER — Encounter: Payer: Self-pay | Admitting: Internal Medicine

## 2010-10-04 ENCOUNTER — Ambulatory Visit (INDEPENDENT_AMBULATORY_CARE_PROVIDER_SITE_OTHER): Payer: BC Managed Care – PPO | Admitting: Internal Medicine

## 2010-10-04 ENCOUNTER — Ambulatory Visit: Payer: Self-pay | Admitting: Internal Medicine

## 2010-10-04 ENCOUNTER — Encounter: Payer: Self-pay | Admitting: Internal Medicine

## 2010-10-04 DIAGNOSIS — J309 Allergic rhinitis, unspecified: Secondary | ICD-10-CM

## 2010-10-04 DIAGNOSIS — I10 Essential (primary) hypertension: Secondary | ICD-10-CM

## 2010-10-04 DIAGNOSIS — E785 Hyperlipidemia, unspecified: Secondary | ICD-10-CM

## 2010-10-04 MED ORDER — ATORVASTATIN CALCIUM 40 MG PO TABS: 40.0000 mg | ORAL_TABLET | ORAL | Status: DC

## 2010-10-04 NOTE — Progress Notes (Signed)
Subjective:    Michaela [name] is a 60 y.o. female here for follow up of dyslipidemia. The patient does not use medications that may worsen dyslipidemias (corticosteroids, progestins, anabolic steroids, diuretics, beta-blockers, amiodarone, cyclosporine, olanzapine). The patient exercises frequently. The patient is not known to have coexisting coronary artery disease.   Cardiac Risk Factors Age > 45-female, > 55-female:  YES  +1  Smoking:   NO  Sig. family hx of CHD*:  YES  +1  Hypertension:   YES  +1  Diabetes:   NO  HDL < 35:  no  HDL > 59:   YES  -1  Total: 2   *- Sig. family h/o CHD per NCEP = MI or sudden death at <55yo in  father or other 1st-degree female relative, or <65yo in mother or  other 1st-degree female relative  The following portions of the patient's history were reviewed and updated as appropriate: allergies, current medications, past family history, past medical history, past social history, past surgical history and problem list.  Review of Systems A comprehensive review of systems was negative.    Objective:    BP 130/80  Pulse 72  Temp(Src) 98.4 F (36.9 C) (Oral)  Resp 14  Ht 5\' 3"  (1.6 m)  Wt 170 lb (77.111 kg)  BMI 30.11 kg/m2  General Appearance:    Alert, cooperative, no distress, appears stated age  Head:    Normocephalic, without obvious abnormality, atraumatic  Eyes:    PERRL, conjunctiva/corneas clear, EOM's intact, fundi    benign, both eyes  Ears:    Normal TM's and external ear canals, both ears  Nose:   Nares normal, septum midline, mucosa normal, no drainage    or sinus tenderness  Throat:   Lips, mucosa, and tongue normal; teeth and gums normal  Neck:   Supple, symmetrical, trachea midline, no adenopathy;    thyroid:  no enlargement/tenderness/nodules; no carotid   bruit or JVD  Back:     Symmetric, no curvature, ROM normal, no CVA tenderness  Lungs:     Clear to auscultation bilaterally, respirations unlabored  Chest Wall:    No  tenderness or deformity   Heart:    Regular rate and rhythm, S1 and S2 normal, no murmur, rub   or gallop  Breast Exam:    No tenderness, masses, or nipple abnormality  Abdomen:     Soft, non-tender, bowel sounds active all four quadrants,    no masses, no organomegaly  Genitalia:    Normal female without lesion, discharge or tenderness  Rectal:    Normal tone, normal prostate, no masses or tenderness;   guaiac negative stool  Extremities:   Extremities normal, atraumatic, no cyanosis or edema  Pulses:   2+ and symmetric all extremities  Skin:   Skin color, texture, turgor normal, no rashes or lesions  Lymph nodes:   Cervical, supraclavicular, and axillary nodes normal  Neurologic:   CNII-XII intact, normal strength, sensation and reflexes    throughout    Lab Review Lab Results  Component Value Date   CHOL 207* 09/27/2010   CHOL 199 03/29/2010   CHOL 213* 06/14/2009   HDL 60.20 09/27/2010   HDL 16.10 03/29/2010   HDL 96.04 06/14/2009   LDLDIRECT 131.9 09/27/2010   LDLDIRECT 124.8 06/14/2009   LDLDIRECT 167.4 03/22/2009      Assessment:    Dyslipidemia as detailed above with 2 CHD risk factors using NCEP scheme above.  Target levels for LDL are: < 100  mg/dl (CHD or "CHD risk equivalent" is present)  Explained to the patient the respective contributions of genetics, diet, and exercise to lipid levels and the use of medication in severe cases which do not respond to lifestyle alteration. The patient's interest and motivation in making lifestyle changes seems excellent.    Plan:    The following changes are planned for the next 3 months, at which time the patient will return for repeat fasting lipids:  1. Dietary changes: Reduce saturated fat, "trans" monounsaturated fatty acids, and cholesterol 2. Exercise changes:  Advised to engage a physical trainer 3. Other treatment: Weight reduction (10 lbs) 4. Lipid-lowering medications:  increase statin to twice weekly  (Recommended by  NCEP after 3-6 mos of dietary therapy & lifestyle modification,  except if CHD is present or LDL well above 190.) 5. Hormone replacement therapy (patient is a postmenopausal  woman): no 6. Screening for secondary causes of dyslipidemias: None indicated 7. Lipid screening for relatives:   8. Follow up: 3 month.  Note: The majority of the visit was spent in counseling on the pathophysiology and treatment of dyslipidemias. The total face-to-face time was in excess of 30 minutes.  Subjective:    Patient here for follow-up of elevated blood pressure.  She is exercising and is adherent to a low-salt diet.  Blood pressure is well controlled at home. Cardiac symptoms: none. Patient denies: chest pain, fatigue, irregular heart beat and lower extremity edema. Cardiovascular risk factors: family history of premature cardiovascular disease and hypertension. Use of agents associated with hypertension: none. History of target organ damage: none.  The following portions of the patient's history were reviewed and updated as appropriate: allergies, current medications, past family history, past medical history, past social history, past surgical history and problem list.  Review of Systems A comprehensive review of systems was negative.     Objective:     exam for hyperlipidemia    Assessment:    Hypertension, stage 1 Evidence of target organ damage: none.    Plan:    Dietary sodium restriction. Regular aerobic exercise.

## 2010-10-30 ENCOUNTER — Other Ambulatory Visit: Payer: Self-pay | Admitting: Internal Medicine

## 2010-12-18 NOTE — Assessment & Plan Note (Signed)
HEALTHCARE                             PULMONARY OFFICE NOTE   NAME:[name], [name]                         MRN:          [id number]  DATE:07/03/2007                            DOB:          02-18-1951    PROBLEM:  Allergy consultation at the kind referral of Dr. Lovell [name] for  this 60 year old woman, concerned about seasonal nasal and chest  symptoms.   HISTORY:  She says for about 6 years each February and March she has  developed sinus congestion, earache, some throat discomfort; then steady  progression into her chest, where her cough will persist for several  months.  This year she also had similar episodes lasting for shorter  periods of time in June and October.  With the episode in June she had  stayed at a beach house, where she wonders if there was invisible mold  based on odor.  In October she had visited a church in [place] that  she says was obviously was mildewed.  She had never noted this pattern  prior.  She tried a metered inhaler once in the past, and questions  whether it helped (just does not remember the circumstances).  She gets  quite well between episodes.   MEDICATIONS:  1. Visoprolol/hydrochlorothiazide 2.5/6.25 mg.  2. Aspirin 81 mg.  3. Retin-A.  4. Vitamins.  5. Calcium with vitamin D.  6. Red yeast rice.  7. Imitrex p.r.n.  8. Zomig.  9. Naprosyn.   ALLERGIES:  SULFA DRUG INTOLERANCE.   REVIEW OF SYSTEMS:  Chest congestion, shortness of breath, nasal  congestion (especially in the Spring, as noted per HPI.  Nonproductive  cough.  She denies any reflux symptoms.  Occasional sore throats and  earaches.  Not aware of rash, adenopathy, change in bowel or bladder,  weight loss, blood or purulent discharge.  No problems with particular  foods, cosmetics, insect stings, latex, contrast dye or aspirin.   SOCIAL HISTORY:  Quit smoking 25 years ago.  Social alcohol.  Married,  no children.  Living with husband.  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   ENVIRONMENTAL:  She says [place], surrounded  by hardwood trees; and they heat with wood.  One dog indoors.  Crawl  space.  No smokers.   FAMILY HISTORY:  She is not aware of anyone diagnosed with allergy  problems.  There is heart disease.   OBJECTIVE:  Weight 163 pounds, BP 136/74, pulse regular 57, room air  saturation 99%.  GENERAL APPEARANCE:  Skin without rash.  HEENT:  Conjunctivae, nasal mucosa and tympanic membranes all look  normal.  She said this is a very good day for her.  Palate spacing is  long, 4/4, with no stridor.  I cannot see the posterior pharynx.  There  is no thyromegaly.  CHEST:  Quite, clear lung fields; unlabored.  Heart sounds are regular  without murmur.  ABDOMEN:  Without hepatosplenomegaly.   VACCINATIONS:  She has had flu vaccine.  Has not  had pneumococcal  vaccine at age 75; no special risks.   IMPRESSION:  1. SEASONAL EPISODIC RHINITIS/BRONCHITIS SYNDROME.  At least some of      this is probably viral, appreciating that there is a persistent      bronchitic cough component.  I cannot tell if she is describing      some asthma, or if occasionally she gets a sinus infection.  She is      asking how much of this is based on allergy, and we discussed that.   PLAN:  1. Tri Nasacort q8h 1-2 sprays each nostril day. (Sample and      prescription).  2. Blood work for IGE with __________ .  CBC with differential.  3. Watch for sleep apnea, based on length of palate.  4. Environmental precautions.  5. Return as able for skin testing.     Michaela D. Maple Hudson, MD, Michaela [name], FACP  Electronically Signed    CDY/MedQ  DD: 07/04/2007  DT: 07/04/2007  Job #: [id number]   cc:   Michaela Glaze, MD

## 2011-01-04 ENCOUNTER — Other Ambulatory Visit (INDEPENDENT_AMBULATORY_CARE_PROVIDER_SITE_OTHER): Payer: BC Managed Care – PPO

## 2011-01-04 DIAGNOSIS — I10 Essential (primary) hypertension: Secondary | ICD-10-CM

## 2011-01-04 LAB — HEPATIC FUNCTION PANEL
ALT: 17 U/L (ref 0–35)
AST: 15 U/L (ref 0–37)
Total Bilirubin: 0.9 mg/dL (ref 0.3–1.2)

## 2011-01-04 LAB — LIPID PANEL
Cholesterol: 164 mg/dL (ref 0–200)
LDL Cholesterol: 82 mg/dL (ref 0–99)

## 2011-01-11 ENCOUNTER — Encounter: Payer: Self-pay | Admitting: Internal Medicine

## 2011-01-11 ENCOUNTER — Ambulatory Visit (INDEPENDENT_AMBULATORY_CARE_PROVIDER_SITE_OTHER): Payer: BC Managed Care – PPO | Admitting: Internal Medicine

## 2011-01-11 VITALS — BP 132/80 | HR 60 | Temp 98.2°F | Resp 14 | Ht 62.75 in | Wt 162.0 lb

## 2011-01-11 DIAGNOSIS — Z0000 Encounter for general adult medical examination without abnormal findings: Secondary | ICD-10-CM

## 2011-01-11 DIAGNOSIS — E785 Hyperlipidemia, unspecified: Secondary | ICD-10-CM

## 2011-01-11 DIAGNOSIS — J45909 Unspecified asthma, uncomplicated: Secondary | ICD-10-CM

## 2011-01-11 DIAGNOSIS — I10 Essential (primary) hypertension: Secondary | ICD-10-CM

## 2011-01-11 DIAGNOSIS — M1990 Unspecified osteoarthritis, unspecified site: Secondary | ICD-10-CM

## 2011-01-11 NOTE — Progress Notes (Signed)
Subjective:    Patient ID: Michaela [name], female    DOB: 02-08-1951, 60 y.o.   MRN: [id number]  HPI Patient presents for followup of hyperlipidemia hypertension and a history of osteoarthritis and allergic rhinitis.  At the last visit we increased her Lipitor to pulse therapy twice weekly with excellent result she is now all goals she tolerated the medication well she has lost weight her blood pressure reflects this weight loss she feels better.  She is requesting referral for ophthalmology evaluation.   Review of Systems  Constitutional: Negative for activity change, appetite change and fatigue.  HENT: Negative for ear pain, congestion, neck pain, postnasal drip and sinus pressure.   Eyes: Negative for redness and visual disturbance.  Respiratory: Negative for cough, shortness of breath and wheezing.   Gastrointestinal: Negative for abdominal pain and abdominal distention.  Genitourinary: Negative for dysuria, frequency and menstrual problem.  Musculoskeletal: Negative for myalgias, joint swelling and arthralgias.  Skin: Negative for rash and wound.  Neurological: Negative for dizziness, weakness and headaches.  Hematological: Negative for adenopathy. Does not bruise/bleed easily.  Psychiatric/Behavioral: Negative for sleep disturbance and decreased concentration.   Past Medical History  Diagnosis Date  . Hyperlipidemia   . Migraine   . Heel spur   . Hypertension   . Allergy   . Arthritis    History reviewed. No pertinent past surgical history.  reports that she quit smoking about 29 years ago. She does not have any smokeless tobacco history on file. She reports that she drinks alcohol. She reports that she does not use illicit drugs. family history includes Early death in her father; Heart disease in her mother; and Stroke in her mother. Allergies  Allergen Reactions  . Sulfonamide Derivatives     REACTION: Hives       Objective:   Physical Exam  Constitutional: She is  oriented to person, place, and time. She appears well-developed and well-nourished. No distress.  HENT:  Head: Normocephalic and atraumatic.  Right Ear: External ear normal.  Left Ear: External ear normal.  Nose: Nose normal.  Mouth/Throat: Oropharynx is clear and moist.  Eyes: Conjunctivae and EOM are normal. Pupils are equal, round, and reactive to light.  Neck: Normal range of motion. Neck supple. No JVD present. No tracheal deviation present. No thyromegaly present.  Cardiovascular: Normal rate, regular rhythm, normal heart sounds and intact distal pulses.   No murmur heard. Pulmonary/Chest: Effort normal and breath sounds normal. She has no wheezes. She exhibits no tenderness.  Abdominal: Soft. Bowel sounds are normal.  Musculoskeletal: Normal range of motion. She exhibits no edema and no tenderness.  Lymphadenopathy:    She has no cervical adenopathy.  Neurological: She is alert and oriented to person, place, and time. She has normal reflexes. No cranial nerve deficit.  Skin: Skin is warm and dry. She is not diaphoretic.  Psychiatric: She has a normal mood and affect. Her behavior is normal.          Assessment & Plan:  Patient is a pleasant 60 year old white female who has lost significant weight which is impacted her blood pressure and her cholesterol.  As well as increasing her Lipitor to 20 mg by mouth twice a day we reviewed the blood work on prior to this visit showing that she has reached her goals for cholesterol management.  We discussed continuing diet with a goal of weight 150 pounds.  We set her up for a followup physical in December of this year refilled  the medications as necessary discussed potential side effects and discussed all health maintenance issues are that her do prior to her physical examination including eye examination with an ophthalmologist

## 2011-06-07 ENCOUNTER — Other Ambulatory Visit: Payer: Self-pay | Admitting: Internal Medicine

## 2011-06-19 ENCOUNTER — Other Ambulatory Visit: Payer: Self-pay | Admitting: Internal Medicine

## 2011-07-04 ENCOUNTER — Other Ambulatory Visit (INDEPENDENT_AMBULATORY_CARE_PROVIDER_SITE_OTHER): Payer: BC Managed Care – PPO

## 2011-07-04 DIAGNOSIS — Z0000 Encounter for general adult medical examination without abnormal findings: Secondary | ICD-10-CM

## 2011-07-04 LAB — BASIC METABOLIC PANEL
Chloride: 107 mEq/L (ref 96–112)
GFR: 75.57 mL/min (ref >60.00)
Potassium: 4 mEq/L (ref 3.5–5.1)
Sodium: 142 mEq/L (ref 135–145)

## 2011-07-04 LAB — LIPID PANEL
Cholesterol: 195 mg/dL (ref 0–200)
HDL: 68.1 mg/dL (ref >39.00)
Triglycerides: 81 mg/dL (ref 0.0–149.0)
VLDL: 16.2 mg/dL (ref 0.0–40.0)

## 2011-07-04 LAB — CBC WITH DIFFERENTIAL/PLATELET
Basophils Relative: 0.5 % (ref 0.0–3.0)
Eosinophils Relative: 2.1 % (ref 0.0–5.0)
Hemoglobin: 13.3 g/dL (ref 12.0–15.0)
MCV: 91 fl (ref 78.0–100.0)
Monocytes Absolute: 0.4 10*3/uL (ref 0.1–1.0)
Neutrophils Relative %: 60.6 % (ref 43.0–77.0)
RBC: 4.29 Mil/uL (ref 3.87–5.11)
WBC: 4.9 10*3/uL (ref 4.5–10.5)

## 2011-07-04 LAB — POCT URINALYSIS DIPSTICK
Bilirubin, UA: NEGATIVE
Glucose, UA: NEGATIVE
Nitrite, UA: NEGATIVE

## 2011-07-04 LAB — HEPATIC FUNCTION PANEL
ALT: 19 U/L (ref 0–35)
Albumin: 4.4 g/dL (ref 3.5–5.2)
Bilirubin, Direct: 0 mg/dL (ref 0.0–0.3)
Total Protein: 6.8 g/dL (ref 6.0–8.3)

## 2011-07-05 LAB — VITAMIN D 25 HYDROXY (VIT D DEFICIENCY, FRACTURES): Vit D, 25-Hydroxy: 49 ng/mL (ref 30–89)

## 2011-07-08 ENCOUNTER — Encounter: Payer: Self-pay | Admitting: Internal Medicine

## 2011-07-11 ENCOUNTER — Other Ambulatory Visit (HOSPITAL_COMMUNITY)
Admission: RE | Admit: 2011-07-11 | Discharge: 2011-07-11 | Disposition: A | Discharge status: Home / Self Care | Payer: BC Managed Care – PPO | Source: Ambulatory Visit | Attending: Internal Medicine | Admitting: Internal Medicine

## 2011-07-11 ENCOUNTER — Ambulatory Visit (INDEPENDENT_AMBULATORY_CARE_PROVIDER_SITE_OTHER): Payer: BC Managed Care – PPO | Admitting: Internal Medicine

## 2011-07-11 ENCOUNTER — Encounter: Payer: Self-pay | Admitting: Internal Medicine

## 2011-07-11 DIAGNOSIS — Z0000 Encounter for general adult medical examination without abnormal findings: Secondary | ICD-10-CM

## 2011-07-11 DIAGNOSIS — E785 Hyperlipidemia, unspecified: Secondary | ICD-10-CM

## 2011-07-11 DIAGNOSIS — I10 Essential (primary) hypertension: Secondary | ICD-10-CM

## 2011-07-11 DIAGNOSIS — J45909 Unspecified asthma, uncomplicated: Secondary | ICD-10-CM

## 2011-07-11 DIAGNOSIS — Z01419 Encounter for gynecological examination (general) (routine) without abnormal findings: Secondary | ICD-10-CM | POA: Insufficient documentation

## 2011-07-11 NOTE — Patient Instructions (Signed)
The patient is instructed to continue all medications as prescribed. Schedule followup with check out clerk upon leaving the clinic  

## 2011-07-12 ENCOUNTER — Other Ambulatory Visit: Payer: Self-pay | Admitting: Internal Medicine

## 2011-07-22 NOTE — Progress Notes (Signed)
Subjective:    Patient ID: Michaela [name], female    DOB: Jul 03, 1951, 60 y.o.   MRN: [id number]  HPI  Patient presents for complete physical examination She is followed for hypertension, asthma, and hyperlipidemia.  She has been on a combination of Lipitor and omega-3 supplements for control of her cholesterol and she states that she is compliant with medication without side effects.  She uses a combination of nasal saline and nasal corticosteroids for her chronic sinusitis she is currently not on any asthma maintenance drugs and has been doing well.  Her blood pressure is well controlled with a combination of a diuretic and beta blocker and she is tolerating the medications without side effects  Review of Systems  Constitutional: Negative for activity change, appetite change and fatigue.  HENT: Negative for ear pain, congestion, neck pain, postnasal drip and sinus pressure.   Eyes: Negative for redness and visual disturbance.  Respiratory: Negative for cough, shortness of breath and wheezing.   Gastrointestinal: Negative for abdominal pain and abdominal distention.  Genitourinary: Negative for dysuria, frequency and menstrual problem.  Musculoskeletal: Negative for myalgias, joint swelling and arthralgias.  Skin: Negative for rash and wound.  Neurological: Negative for dizziness, weakness and headaches.  Hematological: Negative for adenopathy. Does not bruise/bleed easily.  Psychiatric/Behavioral: Negative for sleep disturbance and decreased concentration.       Past Medical History  Diagnosis Date  . Hyperlipidemia   . Migraine   . Heel spur   . Hypertension   . Allergy   . Arthritis     History   Social History  . Marital Status: Married    Spouse Name: N/A    Number of Children: N/A  . Years of Education: N/A   Occupational History  . teacher    Social History Main Topics  . Smoking status: Former Smoker    Quit date: 10/03/1981  . Smokeless tobacco: Not on  file  . Alcohol Use: Yes  . Drug Use: No  . Sexually Active: Yes   Other Topics Concern  . Not on file   Social History Narrative  . No narrative on file    No past surgical history on file.  Family History  Problem Relation Age of Onset  . Heart disease Mother   . Stroke Mother   . Early death Father     accidental death    Allergies  Allergen Reactions  . Sulfonamide Derivatives     REACTION: Hives    Current Outpatient Prescriptions on File Prior to Visit  Medication Sig Dispense Refill  . aspirin 81 MG tablet Take 81 mg by mouth daily.        Marland Kitchen atorvastatin (LIPITOR) 40 MG tablet Take 1 tablet (40 mg total) by mouth as directed. One po twice a week   30 tablet  11  . bisoprolol-hydrochlorothiazide (ZIAC) 5-6.25 MG per tablet TAKE 1 TABLET EVERY DAY  30 tablet  9  . calcium gluconate 500 MG tablet Take 500 mg by mouth daily.        . Cholecalciferol (VITAMIN D) 1000 UNITS capsule Take 1,000 Units by mouth daily.        . fish oil-omega-3 fatty acids 1000 MG capsule Take 2 g by mouth daily.        . fluticasone (FLONASE) 50 MCG/ACT nasal spray        . naproxen (NAPROSYN) 500 MG tablet TAKE 1 TABLET TWICE A DAY  60 tablet  6  BP 136/80  Pulse 76  Temp 98.2 F (36.8 C)  Resp 16  Ht 5\' 3"  (1.6 m)  Wt 166 lb (75.297 kg)  BMI 29.41 kg/m2    Objective:   Physical Exam  Nursing note and vitals reviewed. Constitutional: She is oriented to person, place, and time. She appears well-developed and well-nourished. No distress.  HENT:  Head: Normocephalic and atraumatic.  Right Ear: External ear normal.  Left Ear: External ear normal.  Nose: Nose normal.  Mouth/Throat: Oropharynx is clear and moist.  Eyes: Conjunctivae and EOM are normal. Pupils are equal, round, and reactive to light.  Neck: Normal range of motion. Neck supple. No JVD present. No tracheal deviation present. No thyromegaly present.  Cardiovascular: Normal rate, regular rhythm, normal heart  sounds and intact distal pulses.   No murmur heard. Pulmonary/Chest: Effort normal and breath sounds normal. She has no wheezes. She exhibits no tenderness.  Abdominal: Soft. Bowel sounds are normal.  Musculoskeletal: Normal range of motion. She exhibits no edema and no tenderness.  Lymphadenopathy:    She has no cervical adenopathy.  Neurological: She is alert and oriented to person, place, and time. She has normal reflexes. No cranial nerve deficit.  Skin: Skin is warm and dry. She is not diaphoretic.  Psychiatric: She has a normal mood and affect. Her behavior is normal.          Assessment & Plan:   This is a routine physical examination for this healthy  Female. Reviewed all health maintenance protocols including mammography colonoscopy bone density and reviewed appropriate screening labs. Her immunization history was reviewed as well as her current medications and allergies refills of her chronic medications were given and the plan for yearly health maintenance was discussed all orders and referrals were made as appropriate.   Reviewed stable problems Refill medications Patient's blood pressure is stable.  Reviewed cholesterol emphasized need to continue to follow a lower cholesterol low fat diet, lose 10 pounds and continue with aerobic exercise 3 times a week.  Reviewed asthma currently stable without maintenance inhaler.

## 2011-07-25 ENCOUNTER — Ambulatory Visit (INDEPENDENT_AMBULATORY_CARE_PROVIDER_SITE_OTHER): Payer: BC Managed Care – PPO | Admitting: Family Medicine

## 2011-07-25 ENCOUNTER — Encounter: Payer: Self-pay | Admitting: Family Medicine

## 2011-07-25 VITALS — BP 126/88 | Temp 98.6°F | Wt 162.0 lb

## 2011-07-25 DIAGNOSIS — R300 Dysuria: Secondary | ICD-10-CM

## 2011-07-25 LAB — POCT URINALYSIS DIPSTICK
Glucose, UA: NEGATIVE
Nitrite, UA: NEGATIVE
Urobilinogen, UA: 0.2

## 2011-07-25 MED ORDER — CIPROFLOXACIN HCL 500 MG PO TABS: 500.0000 mg | ORAL_TABLET | Freq: Two times a day (BID) | ORAL | Status: AC

## 2011-07-25 NOTE — Progress Notes (Signed)
Subjective:    Patient ID: Michaela [name], female    DOB: 11/16/1950, 59 y.o.   MRN: [id number]  HPI 60 year old white female, former smoker, in with complaints of burning with urination has been going on for about a week. She denies any urinary frequency, urgency, blood in her urine, abdominal pain, or back pain. She saw Dr. Lovell Sheehan earlier this month complaints of vaginal discharge, pelvic results were negative.   Review of Systems  Constitutional: Negative.   Respiratory: Negative.   Cardiovascular: Negative.   Genitourinary: Positive for dysuria.  Musculoskeletal: Negative.   Skin: Negative.   Psychiatric/Behavioral: Negative.    Past Medical History  Diagnosis Date  . Hyperlipidemia   . Migraine   . Heel spur   . Hypertension   . Allergy   . Arthritis     History   Social History  . Marital Status: Married    Spouse Name: N/A    Number of Children: N/A  . Years of Education: N/A   Occupational History  . teacher    Social History Main Topics  . Smoking status: Former Smoker    Quit date: 10/03/1981  . Smokeless tobacco: Not on file  . Alcohol Use: Yes  . Drug Use: No  . Sexually Active: Yes   Other Topics Concern  . Not on file   Social History Narrative  . No narrative on file    No past surgical history on file.  Family History  Problem Relation Age of Onset  . Heart disease Mother   . Stroke Mother   . Early death Father     accidental death    Allergies  Allergen Reactions  . Sulfonamide Derivatives     REACTION: Hives    Current Outpatient Prescriptions on File Prior to Visit  Medication Sig Dispense Refill  . aspirin 81 MG tablet Take 81 mg by mouth daily.        Marland Kitchen atorvastatin (LIPITOR) 40 MG tablet Take 1 tablet (40 mg total) by mouth as directed. One po twice a week   30 tablet  11  . bisoprolol-hydrochlorothiazide (ZIAC) 5-6.25 MG per tablet TAKE 1 TABLET EVERY DAY  30 tablet  9  . calcium gluconate 500 MG tablet Take 500 mg by  mouth daily.        . Cholecalciferol (VITAMIN D) 1000 UNITS capsule Take 1,000 Units by mouth daily.        . fish oil-omega-3 fatty acids 1000 MG capsule Take 2 g by mouth daily.        . fluticasone (FLONASE) 50 MCG/ACT nasal spray        . naproxen (NAPROSYN) 500 MG tablet TAKE 1 TABLET TWICE A DAY  60 tablet  6  . tretinoin (RETIN-A) 0.025 % cream APPLY DAILY TO FACE AS DIRECTED  45 g  3    BP 126/88  Temp(Src) 98.6 F (37 C) (Oral)  Wt 162 lb (73.483 kg)chart   Objective:   Physical Exam  Constitutional: She appears well-developed and well-nourished.  Neck: Normal range of motion. Neck supple.  Cardiovascular: Normal rate and regular rhythm.   Pulmonary/Chest: Effort normal and breath sounds normal.  Abdominal: Soft. Bowel sounds are normal.  Skin: Skin is warm and dry.  Psychiatric: She has a normal mood and affect.          Assessment & Plan:  Assessment: Acute cystitis  Plan: Cipro 500 mg one tablet twice a day for 3 days. Drink plenty of  fluids. Wipe from the back. Void after intercourse. Call if symptoms worsen or persist. Recheck as scheduled and when necessary

## 2011-07-25 NOTE — Patient Instructions (Signed)
1. Cipro 500 mg one tablet twice a day for 3 days. Drink plenty of fluids. Wipe from the back. Void after intercourse. Call if symptoms worsen or persist. Recheck as scheduled and when necessary  Urinary Tract Infection Infections of the urinary tract can start in several places. A bladder infection (cystitis), a kidney infection (pyelonephritis), and a prostate infection (prostatitis) are different types of urinary tract infections (UTIs). They usually get better if treated with medicines (antibiotics) that kill germs. Take all the medicine until it is gone. You or your child may feel better in a few days, but TAKE ALL MEDICINE or the infection may not respond and may become more difficult to treat. HOME CARE INSTRUCTIONS   Drink enough water and fluids to keep the urine clear or pale yellow. Cranberry juice is especially recommended, in addition to large amounts of water.   Avoid caffeine, tea, and carbonated beverages. They tend to irritate the bladder.   Alcohol may irritate the prostate.   Only take over-the-counter or prescription medicines for pain, discomfort, or fever as directed by your caregiver.  To prevent further infections:  Empty the bladder often. Avoid holding urine for long periods of time.   After a bowel movement, women should cleanse from front to back. Use each tissue only once.   Empty the bladder before and after sexual intercourse.  FINDING OUT THE RESULTS OF YOUR TEST Not all test results are available during your visit. If your or your child's test results are not back during the visit, make an appointment with your caregiver to find out the results. Do not assume everything is normal if you have not heard from your caregiver or the medical facility. It is important for you to follow up on all test results. SEEK MEDICAL CARE IF:   There is back pain.   Your baby is older than 3 months with a rectal temperature of 100.5 F (38.1 C) or higher for more than 1 day.     Your or your child's problems (symptoms) are no better in 3 days. Return sooner if you or your child is getting worse.  SEEK IMMEDIATE MEDICAL CARE IF:   There is severe back pain or lower abdominal pain.   You or your child develops chills.   You have a fever.   Your baby is older than 3 months with a rectal temperature of 102 F (38.9 C) or higher.   Your baby is 7 months old or younger with a rectal temperature of 100.4 F (38 C) or higher.   There is nausea or vomiting.   There is continued burning or discomfort with urination.  MAKE SURE YOU:   Understand these instructions.   Will watch your condition.   Will get help right away if you are not doing well or get worse.  Document Released: 05/01/2005 Document Revised: 04/03/2011 Document Reviewed: 12/04/2006 [facility] Patient Information 2012 [place], [place].

## 2011-10-08 ENCOUNTER — Other Ambulatory Visit: Payer: Self-pay | Admitting: Internal Medicine

## 2011-10-20 ENCOUNTER — Other Ambulatory Visit: Payer: Self-pay | Admitting: Internal Medicine

## 2011-11-28 ENCOUNTER — Telehealth: Payer: Self-pay | Admitting: *Deleted

## 2011-11-28 NOTE — Telephone Encounter (Signed)
Notified pt. 

## 2011-11-28 NOTE — Telephone Encounter (Signed)
Pt. Has found a skin tag? Behind left knee that was red when she saw it, and has now turned white.  She has an appt with Dr. Lovell Sheehan the 1st week of June, but this is bothering her due to the color change.

## 2011-11-28 NOTE — Telephone Encounter (Signed)
The color change from red to white is not alarming- ok to wait until June per dr Lovell Sheehan

## 2012-01-01 ENCOUNTER — Telehealth: Payer: Self-pay | Admitting: Family Medicine

## 2012-01-01 NOTE — Telephone Encounter (Signed)
Michaela [name], this patient is going to [facility] tomorrow for her labs. It simply says "future fasting labs". Can you please see what these labs are to be and put the orders in for ELAM? Thanks!

## 2012-01-01 NOTE — Telephone Encounter (Signed)
Yeah, my mistake. Thanks!

## 2012-01-01 NOTE — Telephone Encounter (Signed)
Future labs are already in from December at last ov

## 2012-01-01 NOTE — Telephone Encounter (Signed)
Not a mistake!thanks for your help

## 2012-01-02 ENCOUNTER — Other Ambulatory Visit: Payer: BC Managed Care – PPO

## 2012-01-02 ENCOUNTER — Other Ambulatory Visit (INDEPENDENT_AMBULATORY_CARE_PROVIDER_SITE_OTHER): Payer: BC Managed Care – PPO

## 2012-01-02 DIAGNOSIS — E785 Hyperlipidemia, unspecified: Secondary | ICD-10-CM

## 2012-01-02 LAB — HEPATIC FUNCTION PANEL
AST: 13 U/L (ref 0–37)
Alkaline Phosphatase: 36 U/L — ABNORMAL LOW (ref 39–117)
Bilirubin, Direct: 0.1 mg/dL (ref 0.0–0.3)
Total Bilirubin: 1 mg/dL (ref 0.3–1.2)

## 2012-01-02 LAB — LIPID PANEL
Cholesterol: 180 mg/dL (ref 0–200)
LDL Cholesterol: 92 mg/dL (ref 0–99)
Total CHOL/HDL Ratio: 2
VLDL: 13.8 mg/dL (ref 0.0–40.0)

## 2012-01-09 ENCOUNTER — Encounter: Payer: Self-pay | Admitting: Internal Medicine

## 2012-01-09 ENCOUNTER — Ambulatory Visit (INDEPENDENT_AMBULATORY_CARE_PROVIDER_SITE_OTHER): Payer: BC Managed Care – PPO | Admitting: Internal Medicine

## 2012-01-09 ENCOUNTER — Ambulatory Visit (INDEPENDENT_AMBULATORY_CARE_PROVIDER_SITE_OTHER)
Admission: RE | Admit: 2012-01-09 | Discharge: 2012-01-09 | Disposition: A | Discharge status: Home / Self Care | Payer: BC Managed Care – PPO | Source: Ambulatory Visit | Attending: Internal Medicine | Admitting: Internal Medicine

## 2012-01-09 VITALS — BP 136/80 | HR 72 | Temp 98.2°F | Resp 14 | Ht 63.75 in | Wt 168.0 lb

## 2012-01-09 DIAGNOSIS — I10 Essential (primary) hypertension: Secondary | ICD-10-CM

## 2012-01-09 DIAGNOSIS — Z0000 Encounter for general adult medical examination without abnormal findings: Secondary | ICD-10-CM

## 2012-01-09 DIAGNOSIS — M47816 Spondylosis without myelopathy or radiculopathy, lumbar region: Secondary | ICD-10-CM

## 2012-01-09 DIAGNOSIS — Z2911 Encounter for prophylactic immunotherapy for respiratory syncytial virus (RSV): Secondary | ICD-10-CM

## 2012-01-09 DIAGNOSIS — J45909 Unspecified asthma, uncomplicated: Secondary | ICD-10-CM

## 2012-01-09 DIAGNOSIS — M47817 Spondylosis without myelopathy or radiculopathy, lumbosacral region: Secondary | ICD-10-CM

## 2012-01-09 DIAGNOSIS — E785 Hyperlipidemia, unspecified: Secondary | ICD-10-CM

## 2012-01-09 NOTE — Progress Notes (Signed)
Subjective:    Patient ID: Michaela [name], female    DOB: 12/23/1950, 61 y.o.   MRN: [id number]  HPI Follow up for lipid management reviewed liver Hx of hepatitis A Spot on back of leg skin tag or wart? Patient has what appears to be radicular pain down the left leg she states sometimes relates to the knee to the hip.  Is positional in that when she straightens out her leg at night the pain is improved.  She does not have any history of any back    Review of Systems  Constitutional: Negative for activity change, appetite change and fatigue.  HENT: Negative for ear pain, congestion, neck pain, postnasal drip and sinus pressure.   Eyes: Negative for redness and visual disturbance.  Respiratory: Negative for cough, shortness of breath and wheezing.   Gastrointestinal: Negative for abdominal pain and abdominal distention.  Genitourinary: Negative for dysuria, frequency and menstrual problem.  Musculoskeletal: Negative for myalgias, joint swelling and arthralgias.  Skin: Negative for rash and wound.  Neurological: Negative for dizziness, weakness and headaches.  Hematological: Negative for adenopathy. Does not bruise/bleed easily.  Psychiatric/Behavioral: Negative for sleep disturbance and decreased concentration.   Past Medical History  Diagnosis Date  . Hyperlipidemia   . Migraine   . Heel spur   . Hypertension   . Allergy   . Arthritis     History   Social History  . Marital Status: Married    Spouse Name: N/A    Number of Children: N/A  . Years of Education: N/A   Occupational History  . teacher    Social History Main Topics  . Smoking status: Former Smoker    Quit date: 10/03/1981  . Smokeless tobacco: Not on file  . Alcohol Use: Yes  . Drug Use: No  . Sexually Active: Yes   Other Topics Concern  . Not on file   Social History Narrative  . No narrative on file    No past surgical history on file.  Family History  Problem Relation Age of Onset  .  Heart disease Mother   . Stroke Mother   . Early death Father     accidental death    Allergies  Allergen Reactions  . Sulfonamide Derivatives     REACTION: Hives    Current Outpatient Prescriptions on File Prior to Visit  Medication Sig Dispense Refill  . aspirin 81 MG tablet Take 81 mg by mouth daily.        Marland Kitchen atorvastatin (LIPITOR) 40 MG tablet TAKE 1 TABLET BY MOUTH TWICE A WEEK AS DIRECTED  30 tablet  10  . bisoprolol-hydrochlorothiazide (ZIAC) 5-6.25 MG per tablet TAKE 1 TABLET EVERY DAY  30 tablet  9  . calcium gluconate 500 MG tablet Take 500 mg by mouth daily.        . Cholecalciferol (VITAMIN D) 1000 UNITS capsule Take 1,000 Units by mouth daily.        . fexofenadine (ALLEGRA) 60 MG tablet Take 60 mg by mouth daily.      . fish oil-omega-3 fatty acids 1000 MG capsule Take 2 g by mouth daily.        . fluticasone (FLONASE) 50 MCG/ACT nasal spray USE 1 TO 2 SPRAYS IN EACH NOSTRIL EVERY DAY  16 g  2  . naproxen (NAPROSYN) 500 MG tablet TAKE 1 TABLET TWICE A DAY  60 tablet  6  . tretinoin (RETIN-A) 0.025 % cream APPLY DAILY TO FACE AS DIRECTED  45 g  3  . DISCONTD: fluticasone (FLONASE) 50 MCG/ACT nasal spray          BP 136/80  Pulse 72  Temp 98.2 F (36.8 C)  Resp 14  Ht 5' 3.75" (1.619 m)  Wt 168 lb (76.204 kg)  BMI 29.06 kg/m2        Objective:   Physical Exam  Nursing note and vitals reviewed. Constitutional: She is oriented to person, place, and time. She appears well-developed and well-nourished. No distress.  HENT:  Head: Normocephalic and atraumatic.  Right Ear: External ear normal.  Left Ear: External ear normal.  Nose: Nose normal.  Mouth/Throat: Oropharynx is clear and moist.  Eyes: Conjunctivae and EOM are normal. Pupils are equal, round, and reactive to light.  Neck: Normal range of motion. Neck supple. No JVD present. No tracheal deviation present. No thyromegaly present.  Cardiovascular: Normal rate, regular rhythm, normal heart sounds and  intact distal pulses.   No murmur heard. Pulmonary/Chest: Effort normal and breath sounds normal. She has no wheezes. She exhibits no tenderness.  Abdominal: Soft. Bowel sounds are normal.  Musculoskeletal: Normal range of motion. She exhibits no edema and no tenderness.  Lymphadenopathy:    She has no cervical adenopathy.  Neurological: She is alert and oriented to person, place, and time. She has normal reflexes. No cranial nerve deficit.  Skin: Skin is warm and dry. She is not diaphoretic.  Psychiatric: She has a normal mood and affect. Her behavior is normal.          Assessment & Plan:  We will begin with a LS-spine to look for spondylosis or disc space narrowing we have given her a series of 6 back exercises to begin course strengthening for the back depending on the results of the x-ray we'll make appropriate referral either to physical therapy to orthopedics or to just continue the exercises Lipid profile is stable excellent results and continue Lipitor twice weekly is our recommendation

## 2012-01-09 NOTE — Progress Notes (Signed)
Addended by: Willy Eddy on: 01/09/2012 10:17 AM   Modules accepted: Orders

## 2012-01-09 NOTE — Patient Instructions (Addendum)
The patient is instructed to continue all medications as prescribed. Schedule followup with check out clerk upon leaving the clinic Back Exercises Back exercises help treat and prevent back injuries. The goal of back exercises is to increase the strength of your abdominal and back muscles and the flexibility of your back. These exercises should be started when you no longer have back pain. Back exercises include:  Pelvic Tilt. Lie on your back with your knees bent. Tilt your pelvis until the lower part of your back is against the floor. Hold this position 5 to 10 sec and repeat 5 to 10 times.   Knee to Chest. Pull first 1 knee up against your chest and hold for 20 to 30 seconds, repeat this with the other knee, and then both knees. This may be done with the other leg straight or bent, whichever feels better.   Sit-Ups or Curl-Ups. Bend your knees 90 degrees. Start with tilting your pelvis, and do a partial, slow sit-up, lifting your trunk only 30 to 45 degrees off the floor. Take at least 2 to 3 seconds for each sit-up. Do not do sit-ups with your knees out straight. If partial sit-ups are difficult, simply do the above but with only tightening your abdominal muscles and holding it as directed.   Hip-Lift. Lie on your back with your knees flexed 90 degrees. Push down with your feet and shoulders as you raise your hips a couple inches off the floor; hold for 10 seconds, repeat 5 to 10 times.   Back arches. Lie on your stomach, propping yourself up on bent elbows. Slowly press on your hands, causing an arch in your low back. Repeat 3 to 5 times. Any initial stiffness and discomfort should lessen with repetition over time.   Shoulder-Lifts. Lie face down with arms beside your body. Keep hips and torso pressed to floor as you slowly lift your head and shoulders off the floor.  Do not overdo your exercises, especially in the beginning. Exercises may cause you some mild back discomfort which lasts for a  few minutes; however, if the pain is more severe, or lasts for more than 15 minutes, do not continue exercises until you see your caregiver. Improvement with exercise therapy for back problems is slow.  See your caregivers for assistance with developing a proper back exercise program. Document Released: 08/29/2004 Document Revised: 07/11/2011 Document Reviewed: 07/22/2005 [facility] Patient Information 2012 [place], [place].   "pracatical paleo"

## 2012-03-08 ENCOUNTER — Other Ambulatory Visit: Payer: Self-pay | Admitting: Internal Medicine

## 2012-03-09 ENCOUNTER — Other Ambulatory Visit: Payer: Self-pay

## 2012-03-09 NOTE — Telephone Encounter (Signed)
Last Ov was 01/09/12 Rx last filled on 06/07/11. Ok to refill?

## 2012-03-10 MED ORDER — NAPROXEN 500 MG PO TABS: 500.0000 mg | ORAL_TABLET | Freq: Two times a day (BID) | ORAL | Status: DC

## 2012-03-10 NOTE — Telephone Encounter (Signed)
Rx called in to [facility] as directed.

## 2012-03-26 ENCOUNTER — Telehealth: Payer: Self-pay | Admitting: Internal Medicine

## 2012-03-26 NOTE — Telephone Encounter (Signed)
Pt informed and continues to c/o leg numbness- ov given ess

## 2012-03-26 NOTE — Telephone Encounter (Signed)
Pt requesting results of xray °

## 2012-04-15 ENCOUNTER — Encounter: Payer: Self-pay | Admitting: Internal Medicine

## 2012-04-15 ENCOUNTER — Other Ambulatory Visit: Payer: Self-pay | Admitting: Internal Medicine

## 2012-04-15 ENCOUNTER — Ambulatory Visit (INDEPENDENT_AMBULATORY_CARE_PROVIDER_SITE_OTHER): Payer: BC Managed Care – PPO | Admitting: Internal Medicine

## 2012-04-15 VITALS — BP 110/70 | HR 72 | Temp 98.6°F | Resp 16 | Ht 63.5 in | Wt 168.0 lb

## 2012-04-15 DIAGNOSIS — M549 Dorsalgia, unspecified: Secondary | ICD-10-CM

## 2012-04-15 DIAGNOSIS — M47819 Spondylosis without myelopathy or radiculopathy, site unspecified: Secondary | ICD-10-CM

## 2012-04-15 DIAGNOSIS — Z23 Encounter for immunization: Secondary | ICD-10-CM

## 2012-04-15 DIAGNOSIS — M479 Spondylosis, unspecified: Secondary | ICD-10-CM

## 2012-04-15 MED ORDER — CYCLOBENZAPRINE HCL 10 MG PO TABS: 10.0000 mg | ORAL_TABLET | Freq: Three times a day (TID) | ORAL | Status: AC | PRN

## 2012-04-15 MED ORDER — METHYLPREDNISOLONE (PAK) 4 MG PO TABS: ORAL_TABLET | ORAL | Status: AC

## 2012-04-15 NOTE — Patient Instructions (Signed)
Back Exercises Back exercises help treat and prevent back injuries. The goal of back exercises is to increase the strength of your abdominal and back muscles and the flexibility of your back. These exercises should be started when you no longer have back pain. Back exercises include:  Pelvic Tilt. Lie on your back with your knees bent. Tilt your pelvis until the lower part of your back is against the floor. Hold this position 5 to 10 sec and repeat 5 to 10 times.   Knee to Chest. Pull first 1 knee up against your chest and hold for 20 to 30 seconds, repeat this with the other knee, and then both knees. This may be done with the other leg straight or bent, whichever feels better.   Sit-Ups or Curl-Ups. Bend your knees 90 degrees. Start with tilting your pelvis, and do a partial, slow sit-up, lifting your trunk only 30 to 45 degrees off the floor. Take at least 2 to 3 seconds for each sit-up. Do not do sit-ups with your knees out straight. If partial sit-ups are difficult, simply do the above but with only tightening your abdominal muscles and holding it as directed.   Hip-Lift. Lie on your back with your knees flexed 90 degrees. Push down with your feet and shoulders as you raise your hips a couple inches off the floor; hold for 10 seconds, repeat 5 to 10 times.   Back arches. Lie on your stomach, propping yourself up on bent elbows. Slowly press on your hands, causing an arch in your low back. Repeat 3 to 5 times. Any initial stiffness and discomfort should lessen with repetition over time.   Shoulder-Lifts. Lie face down with arms beside your body. Keep hips and torso pressed to floor as you slowly lift your head and shoulders off the floor.  Do not overdo your exercises, especially in the beginning. Exercises may cause you some mild back discomfort which lasts for a few minutes; however, if the pain is more severe, or lasts for more than 15 minutes, do not continue exercises until you see your  caregiver. Improvement with exercise therapy for back problems is slow.  See your caregivers for assistance with developing a proper back exercise program. Document Released: 08/29/2004 Document Revised: 07/11/2011 Document Reviewed: 07/22/2005 [employer] Patient Information 2012 [employer]. 

## 2012-04-15 NOTE — Progress Notes (Signed)
Subjective:    Patient ID: Michaela [name], female    DOB: 13-Oct-1950, 61 y.o.   MRN: [id number]  HPI Back pain  history. Patient had back pain at last physical evaluation and an x-ray was obtained which showed no evident disc disease but facet arthritis at L5 S1.  The patient has had progressive back pain since August it is troublesome it is radiating to the left with crossed leg and with laying on her side with cotton knee she has pain that radiates below the knee on the left side.  Review of the x-ray did show to facet arthritis with worse on the left than the right.  She takes generic Naprosyn for osteoarthritis she did notice that activities at the gym seem to increase the frequency and intensity of the pain   Review of Systems  Constitutional: Negative for activity change, appetite change and fatigue.  HENT: Negative for ear pain, congestion, neck pain, postnasal drip and sinus pressure.   Eyes: Negative for redness and visual disturbance.  Respiratory: Negative for cough, shortness of breath and wheezing.   Gastrointestinal: Negative for abdominal pain and abdominal distention.  Genitourinary: Negative for dysuria, frequency and menstrual problem.  Musculoskeletal: Negative for myalgias, joint swelling and arthralgias.  Skin: Negative for rash and wound.  Neurological: Negative for dizziness, weakness and headaches.  Hematological: Negative for adenopathy. Does not bruise/bleed easily.  Psychiatric/Behavioral: Negative for disturbed wake/sleep cycle and decreased concentration.   . Past Medical History  Diagnosis Date  . Hyperlipidemia   . Migraine   . Heel spur   . Hypertension   . Allergy   . Arthritis     History   Social History  . Marital Status: Married    Spouse Name: N/A    Number of Children: N/A  . Years of Education: N/A   Occupational History  . teacher    Social History Main Topics  . Smoking status: Former Smoker    Quit date: 10/03/1981  .  Smokeless tobacco: Not on file  . Alcohol Use: Yes  . Drug Use: No  . Sexually Active: Yes   Other Topics Concern  . Not on file   Social History Narrative  . No narrative on file    No past surgical history on file.  Family History  Problem Relation Age of Onset  . Heart disease Mother   . Stroke Mother   . Early death Father     accidental death    Allergies  Allergen Reactions  . Sulfonamide Derivatives     REACTION: Hives    Current Outpatient Prescriptions on File Prior to Visit  Medication Sig Dispense Refill  . aspirin 81 MG tablet Take 81 mg by mouth daily.        Marland Kitchen atorvastatin (LIPITOR) 40 MG tablet TAKE 1 TABLET BY MOUTH TWICE A WEEK AS DIRECTED  30 tablet  10  . bisoprolol-hydrochlorothiazide (ZIAC) 5-6.25 MG per tablet TAKE 1 TABLET EVERY DAY  30 tablet  9  . calcium gluconate 500 MG tablet Take 500 mg by mouth daily.        . Cholecalciferol (VITAMIN D) 1000 UNITS capsule Take 1,000 Units by mouth daily.        . fexofenadine (ALLEGRA) 60 MG tablet Take 60 mg by mouth daily.      . fish oil-omega-3 fatty acids 1000 MG capsule Take 2 g by mouth daily.        . fluticasone (FLONASE) 50 MCG/ACT nasal spray  USE 1 TO 2 SPRAYS IN EACH NOSTRIL EVERY DAY  16 g  2  . naproxen (NAPROSYN) 500 MG tablet Take 1 tablet (500 mg total) by mouth 2 (two) times daily with a meal.  60 tablet  6  . tretinoin (RETIN-A) 0.025 % cream APPLY DAILY TO FACE AS DIRECTED  45 g  3    BP 110/70  Pulse 72  Temp 98.6 F (37 C)  Resp 16  Ht 5' 3.5" (1.613 m)  Wt 168 lb (76.204 kg)  BMI 29.29 kg/m2       Objective:   Physical Exam  Nursing note and vitals reviewed. Constitutional: She is oriented to person, place, and time. She appears well-developed and well-nourished. No distress.  HENT:  Head: Normocephalic and atraumatic.  Right Ear: External ear normal.  Left Ear: External ear normal.  Nose: Nose normal.  Mouth/Throat: Oropharynx is clear and moist.  Eyes: Conjunctivae  normal and EOM are normal. Pupils are equal, round, and reactive to light.  Neck: Normal range of motion. Neck supple. No JVD present. No tracheal deviation present. No thyromegaly present.  Cardiovascular: Normal rate, regular rhythm, normal heart sounds and intact distal pulses.   No murmur heard. Pulmonary/Chest: Effort normal and breath sounds normal. She has no wheezes. She exhibits no tenderness.  Abdominal: Soft. Bowel sounds are normal.  Musculoskeletal: Normal range of motion. She exhibits edema and tenderness.  Lymphadenopathy:    She has no cervical adenopathy.  Neurological: She is alert and oriented to person, place, and time. She has normal reflexes. No cranial nerve deficit.  Skin: Skin is warm and dry. She is not diaphoretic.  Psychiatric: She has a normal mood and affect. Her behavior is normal.          Assessment & Plan:  Burst and taper steroid and muscle relaxant Back exercises If persists MRI next step

## 2012-04-29 ENCOUNTER — Other Ambulatory Visit: Payer: Self-pay | Admitting: Internal Medicine

## 2012-05-02 ENCOUNTER — Other Ambulatory Visit: Payer: Self-pay | Admitting: Internal Medicine

## 2012-05-05 ENCOUNTER — Other Ambulatory Visit: Payer: Self-pay | Admitting: Internal Medicine

## 2012-05-09 ENCOUNTER — Other Ambulatory Visit: Payer: Self-pay | Admitting: Internal Medicine

## 2012-05-12 ENCOUNTER — Ambulatory Visit: Payer: BC Managed Care – PPO | Admitting: Internal Medicine

## 2012-06-08 ENCOUNTER — Encounter: Payer: Self-pay | Admitting: Internal Medicine

## 2012-06-08 ENCOUNTER — Ambulatory Visit (INDEPENDENT_AMBULATORY_CARE_PROVIDER_SITE_OTHER): Payer: BC Managed Care – PPO | Admitting: Internal Medicine

## 2012-06-08 VITALS — BP 126/80 | HR 76 | Temp 98.3°F | Resp 16 | Ht 63.5 in | Wt 168.0 lb

## 2012-06-08 DIAGNOSIS — I10 Essential (primary) hypertension: Secondary | ICD-10-CM

## 2012-06-08 DIAGNOSIS — M479 Spondylosis, unspecified: Secondary | ICD-10-CM

## 2012-06-08 NOTE — Patient Instructions (Signed)
Continue the back exercises

## 2012-06-08 NOTE — Progress Notes (Signed)
Subjective:    Patient ID: Michaela [name], female    DOB: [birth year], 61 y.o.   MRN: [id number]  HPI  Patient is a 61 year old female who was seen for acute back pain and was seen to have L3-4 facet arthritis was treated with prednisone Dosepak and back exercises and has done remarkably well.  She is now rate to resume normal exercise activities her blood pressure is stable on her current medications her asthma stable  Review of Systems  Constitutional: Negative for activity change, appetite change and fatigue.  HENT: Negative for ear pain, congestion, neck pain, postnasal drip and sinus pressure.   Eyes: Negative for redness and visual disturbance.  Respiratory: Negative for cough, shortness of breath and wheezing.   Gastrointestinal: Negative for abdominal pain and abdominal distention.  Genitourinary: Negative for dysuria, frequency and menstrual problem.  Musculoskeletal: Negative for myalgias, joint swelling and arthralgias.  Skin: Negative for rash and wound.  Neurological: Negative for dizziness, weakness and headaches.  Hematological: Negative for adenopathy. Does not bruise/bleed easily.  Psychiatric/Behavioral: Negative for sleep disturbance and decreased concentration.       Objective:   Physical Exam  Nursing note and vitals reviewed. Constitutional: She is oriented to person, place, and time. She appears well-developed and well-nourished. No distress.  HENT:  Head: Normocephalic and atraumatic.  Right Ear: External ear normal.  Left Ear: External ear normal.  Nose: Nose normal.  Mouth/Throat: Oropharynx is clear and moist.  Eyes: Conjunctivae normal and EOM are normal. Pupils are equal, round, and reactive to light.  Neck: Normal range of motion. Neck supple. No JVD present. No tracheal deviation present. No thyromegaly present.  Cardiovascular: Normal rate, regular rhythm and intact distal pulses.   Murmur heard. Pulmonary/Chest: Effort normal and breath sounds  normal. She has no wheezes. She exhibits no tenderness.  Abdominal: Soft. Bowel sounds are normal.  Musculoskeletal: Normal range of motion. She exhibits no edema and no tenderness.  Lymphadenopathy:    She has no cervical adenopathy.  Neurological: She is alert and oriented to person, place, and time. She has normal reflexes. No cranial nerve deficit.  Skin: Skin is warm and dry. She is not diaphoretic.  Psychiatric: She has a normal mood and affect. Her behavior is normal.   Past Medical History  Diagnosis Date  . Hyperlipidemia   . Migraine   . Heel spur   . Hypertension   . Allergy   . Arthritis     History   Social History  . Marital Status: Married    Spouse Name: N/A    Number of Children: N/A  . Years of Education: N/A   Occupational History  . teacher    Social History Main Topics  . Smoking status: Former Smoker    Quit date: 10/03/1981  . Smokeless tobacco: Not on file  . Alcohol Use: Yes  . Drug Use: No  . Sexually Active: Yes   Other Topics Concern  . Not on file   Social History Narrative  . No narrative on file    No past surgical history on file.  Family History  Problem Relation Age of Onset  . Heart disease Mother   . Stroke Mother   . Early death Father     accidental death    Allergies  Allergen Reactions  . Sulfonamide Derivatives     REACTION: Hives    Current Outpatient Prescriptions on File Prior to Visit  Medication Sig Dispense Refill  . aspirin 81 MG  tablet Take 81 mg by mouth daily.        Marland Kitchen atorvastatin (LIPITOR) 40 MG tablet TAKE 1 TABLET BY MOUTH TWICE WEEKLY AS DIRECTED  30 tablet  11  . bisoprolol-hydrochlorothiazide (ZIAC) 5-6.25 MG per tablet TAKE 1 TABLET BY MOUTH DAILY  30 tablet  0  . calcium gluconate 500 MG tablet Take 500 mg by mouth daily.        . Cholecalciferol (VITAMIN D) 1000 UNITS capsule Take 1,000 Units by mouth daily.        . fexofenadine (ALLEGRA) 60 MG tablet Take 60 mg by mouth daily.      .  fish oil-omega-3 fatty acids 1000 MG capsule Take 2 g by mouth daily.        . fluticasone (FLONASE) 50 MCG/ACT nasal spray USE 1 TO 2 SPRAYS IN EACH NOSTRIL EVERY DAY  16 g  2  . naproxen (NAPROSYN) 500 MG tablet Take 1 tablet (500 mg total) by mouth 2 (two) times daily with a meal.  60 tablet  6  . tretinoin (RETIN-A) 0.025 % cream APPLY DAILY TO FACE AS DIRECTED  45 g  3    BP 126/80  Pulse 76  Temp 98.3 F (36.8 C)  Resp 16  Ht 5' 3.5" (1.613 m)  Wt 168 lb (76.204 kg)  BMI 29.29 kg/m2          Assessment & Plan:  Stable asthma and blood pressure Resolved low back pain due to arthritic changes

## 2012-07-15 ENCOUNTER — Other Ambulatory Visit: Payer: Self-pay | Admitting: Internal Medicine

## 2012-07-15 ENCOUNTER — Other Ambulatory Visit: Payer: Self-pay | Admitting: *Deleted

## 2012-07-15 MED ORDER — BISOPROLOL-HYDROCHLOROTHIAZIDE 5-6.25 MG PO TABS: 1.0000 | ORAL_TABLET | Freq: Every day | ORAL | Status: DC

## 2012-07-21 ENCOUNTER — Encounter: Payer: Self-pay | Admitting: Internal Medicine

## 2012-08-19 ENCOUNTER — Other Ambulatory Visit: Payer: Self-pay | Admitting: Internal Medicine

## 2012-09-30 ENCOUNTER — Other Ambulatory Visit (INDEPENDENT_AMBULATORY_CARE_PROVIDER_SITE_OTHER): Payer: BC Managed Care – PPO

## 2012-09-30 DIAGNOSIS — Z0000 Encounter for general adult medical examination without abnormal findings: Secondary | ICD-10-CM

## 2012-09-30 LAB — POCT URINALYSIS DIPSTICK
Bilirubin, UA: NEGATIVE
Blood, UA: NEGATIVE
Glucose, UA: NEGATIVE
Ketones, UA: NEGATIVE
Nitrite, UA: NEGATIVE

## 2012-09-30 LAB — CBC WITH DIFFERENTIAL/PLATELET
Basophils Absolute: 0 10*3/uL (ref 0.0–0.1)
Eosinophils Relative: 2.2 % (ref 0.0–5.0)
HCT: 39.6 % (ref 36.0–46.0)
Lymphocytes Relative: 27.3 % (ref 12.0–46.0)
Lymphs Abs: 1.3 10*3/uL (ref 0.7–4.0)
Monocytes Relative: 9.1 % (ref 3.0–12.0)
Neutrophils Relative %: 60.8 % (ref 43.0–77.0)
Platelets: 175 10*3/uL (ref 150.0–400.0)
RDW: 12.7 % (ref 11.5–14.6)
WBC: 4.6 10*3/uL (ref 4.5–10.5)

## 2012-09-30 LAB — BASIC METABOLIC PANEL
BUN: 19 mg/dL (ref 6–23)
Calcium: 9.6 mg/dL (ref 8.4–10.5)
Creatinine, Ser: 0.8 mg/dL (ref 0.4–1.2)
GFR: 80.92 mL/min (ref >60.00)
Glucose, Bld: 84 mg/dL (ref 70–99)
Potassium: 4.7 mEq/L (ref 3.5–5.1)

## 2012-09-30 LAB — LIPID PANEL
HDL: 62.8 mg/dL (ref >39.00)
Triglycerides: 86 mg/dL (ref 0.0–149.0)
VLDL: 17.2 mg/dL (ref 0.0–40.0)

## 2012-09-30 LAB — HEPATIC FUNCTION PANEL
ALT: 18 U/L (ref 0–35)
AST: 19 U/L (ref 0–37)
Bilirubin, Direct: 0 mg/dL (ref 0.0–0.3)
Total Bilirubin: 0.5 mg/dL (ref 0.3–1.2)

## 2012-09-30 LAB — LDL CHOLESTEROL, DIRECT: Direct LDL: 133 mg/dL

## 2012-09-30 LAB — TSH: TSH: 1.98 u[IU]/mL (ref 0.35–5.50)

## 2012-10-07 ENCOUNTER — Ambulatory Visit (INDEPENDENT_AMBULATORY_CARE_PROVIDER_SITE_OTHER): Payer: BC Managed Care – PPO | Admitting: Internal Medicine

## 2012-10-07 ENCOUNTER — Encounter: Payer: Self-pay | Admitting: Internal Medicine

## 2012-10-07 VITALS — BP 130/80 | HR 60 | Temp 98.2°F | Resp 16 | Ht 63.5 in | Wt 174.0 lb

## 2012-10-07 DIAGNOSIS — Z0000 Encounter for general adult medical examination without abnormal findings: Secondary | ICD-10-CM

## 2012-10-07 DIAGNOSIS — E785 Hyperlipidemia, unspecified: Secondary | ICD-10-CM

## 2012-10-07 NOTE — Progress Notes (Signed)
  Subjective:    Patient ID: Michaela [name], female    DOB: [birth year], 62 y.o.   MRN: [id number]  HPI  CPX Again noted weight gain and its impact on cholesterol  Review of Systems  Constitutional: Negative for activity change, appetite change and fatigue.  HENT: Negative for ear pain, congestion, neck pain, postnasal drip and sinus pressure.   Eyes: Negative for redness and visual disturbance.  Respiratory: Negative for cough, shortness of breath and wheezing.   Gastrointestinal: Negative for abdominal pain and abdominal distention.  Genitourinary: Negative for dysuria, frequency and menstrual problem.  Musculoskeletal: Negative for myalgias, joint swelling and arthralgias.  Skin: Negative for rash and wound.  Neurological: Negative for dizziness, weakness and headaches.  Hematological: Negative for adenopathy. Does not bruise/bleed easily.  Psychiatric/Behavioral: Negative for sleep disturbance and decreased concentration.       Objective:   Physical Exam  Constitutional: She is oriented to person, place, and time. She appears well-developed and well-nourished. No distress.  HENT:  Head: Normocephalic and atraumatic.  Right Ear: External ear normal.  Left Ear: External ear normal.  Nose: Nose normal.  Mouth/Throat: Oropharynx is clear and moist.  Eyes: Conjunctivae and EOM are normal. Pupils are equal, round, and reactive to light.  Neck: Normal range of motion. Neck supple. No JVD present. No tracheal deviation present. No thyromegaly present.  Cardiovascular: Normal rate, regular rhythm, normal heart sounds and intact distal pulses.   No murmur heard. Pulmonary/Chest: Effort normal and breath sounds normal. She has no wheezes. She exhibits no tenderness.  Abdominal: Soft. Bowel sounds are normal.  Musculoskeletal: Normal range of motion. She exhibits no edema and no tenderness.  Lymphadenopathy:    She has no cervical adenopathy.  Neurological: She is alert and oriented to  person, place, and time. She has normal reflexes. No cranial nerve deficit.  Skin: Skin is warm and dry. She is not diaphoretic.  Psychiatric: She has a normal mood and affect. Her behavior is normal.          Assessment & Plan:   This is a routine physical examination for this healthy  Female. Reviewed all health maintenance protocols including mammography colonoscopy bone density and reviewed appropriate screening labs. Her immunization history was reviewed as well as her current medications and allergies refills of her chronic medications were given and the plan for yearly health maintenance was discussed all orders and referrals were made as appropriate.  Weight gain noted and discussed the impact of weight gain on her cholesterol we'll not increase her Lipitor at this time but recheck after the patient has had an opportunity to lose weight redouble her dietary vigilance

## 2012-10-16 ENCOUNTER — Other Ambulatory Visit: Payer: Self-pay | Admitting: Internal Medicine

## 2013-01-06 ENCOUNTER — Telehealth: Payer: Self-pay | Admitting: Internal Medicine

## 2013-01-06 NOTE — Telephone Encounter (Signed)
Patient Information:  Caller Name: Denita  Phone: [phone]  Patient: Michaela [name], Michaela [name]  Gender: Female  DOB: May 19, 1951  Age: 62 Years  PCP: Darryll Capers (Adults only)  Office Follow Up:  Does the office need to follow up with this patient?: No  Instructions For The Office: N/A  RN Note:  Discussed MRSA infection and transmission. Pt had no other questions.  Symptoms  Reason For Call & Symptoms: Pt calling asking questions about MRSA. Friend in the hospital has it and she hugged her before she the pt was dx. Pt is not symptomatic.  Reviewed Health History In EMR: N/A  Reviewed Medications In EMR: N/A  Reviewed Allergies In EMR: N/A  Reviewed Surgeries / Procedures: N/A  Date of Onset of Symptoms: Unknown  Guideline(s) Used:  No Protocol Available - Information Only  Disposition Per Guideline:   Home Care  Reason For Disposition Reached:   Information only question and nurse able to answer  Advice Given:  N/A  Patient Will Follow Care Advice:  YES

## 2013-01-20 ENCOUNTER — Encounter: Payer: Self-pay | Admitting: Internal Medicine

## 2013-01-26 ENCOUNTER — Encounter: Payer: Self-pay | Admitting: Internal Medicine

## 2013-04-01 ENCOUNTER — Ambulatory Visit (AMBULATORY_SURGERY_CENTER): Payer: BC Managed Care – PPO | Admitting: *Deleted

## 2013-04-01 ENCOUNTER — Encounter: Payer: Self-pay | Admitting: Internal Medicine

## 2013-04-01 VITALS — Ht 63.0 in | Wt 167.8 lb

## 2013-04-01 DIAGNOSIS — Z1211 Encounter for screening for malignant neoplasm of colon: Secondary | ICD-10-CM

## 2013-04-01 MED ORDER — MOVIPREP 100 G PO SOLR: ORAL | Status: DC

## 2013-04-01 NOTE — Progress Notes (Signed)
No allergies to eggs or soy. No problems with anesthesia.  

## 2013-04-02 ENCOUNTER — Other Ambulatory Visit (INDEPENDENT_AMBULATORY_CARE_PROVIDER_SITE_OTHER): Payer: BC Managed Care – PPO

## 2013-04-02 DIAGNOSIS — E785 Hyperlipidemia, unspecified: Secondary | ICD-10-CM

## 2013-04-02 LAB — LIPID PANEL
HDL: 62.6 mg/dL (ref >39.00)
Total CHOL/HDL Ratio: 3
Triglycerides: 79 mg/dL (ref 0.0–149.0)
VLDL: 15.8 mg/dL (ref 0.0–40.0)

## 2013-04-02 LAB — HEPATIC FUNCTION PANEL
Bilirubin, Direct: 0.1 mg/dL (ref 0.0–0.3)
Total Bilirubin: 0.7 mg/dL (ref 0.3–1.2)
Total Protein: 6.7 g/dL (ref 6.0–8.3)

## 2013-04-09 ENCOUNTER — Ambulatory Visit: Payer: BC Managed Care – PPO | Admitting: Internal Medicine

## 2013-04-12 ENCOUNTER — Encounter: Payer: Self-pay | Admitting: Internal Medicine

## 2013-04-12 ENCOUNTER — Ambulatory Visit (INDEPENDENT_AMBULATORY_CARE_PROVIDER_SITE_OTHER): Payer: BC Managed Care – PPO | Admitting: Internal Medicine

## 2013-04-12 VITALS — BP 136/76 | HR 72 | Temp 98.2°F | Resp 16 | Ht 63.0 in | Wt 166.0 lb

## 2013-04-12 DIAGNOSIS — E785 Hyperlipidemia, unspecified: Secondary | ICD-10-CM

## 2013-04-12 DIAGNOSIS — Z23 Encounter for immunization: Secondary | ICD-10-CM

## 2013-04-12 DIAGNOSIS — I10 Essential (primary) hypertension: Secondary | ICD-10-CM

## 2013-04-12 DIAGNOSIS — Z8639 Personal history of other endocrine, nutritional and metabolic disease: Secondary | ICD-10-CM

## 2013-04-12 NOTE — Progress Notes (Signed)
Subjective:    Patient ID: Michaela [name], female    DOB: [birth year], 62 y.o.   MRN: [id number]  HPI  This is a 62 year old female is followed for hypertension hyperlipidemia and had a prior to her examination today a lipid and a liver panel drawn.  She has reversed the increase in her cholesterol and return to goal rate of note her liver functions were essentially normal but her alkaline phosphatase was low indicating possible low bone turnover in the discussed checking a vitamin D level today and the more consistent use of vitamin D and calcium as a prevention of osteoporosis  Review of Systems  Constitutional: Negative for activity change, appetite change and fatigue.  HENT: Negative for ear pain, congestion, neck pain, postnasal drip and sinus pressure.   Eyes: Negative for redness and visual disturbance.  Respiratory: Negative for cough, shortness of breath and wheezing.   Gastrointestinal: Negative for abdominal pain and abdominal distention.  Genitourinary: Negative for dysuria, frequency and menstrual problem.  Musculoskeletal: Negative for myalgias, joint swelling and arthralgias.  Skin: Negative for rash and wound.  Neurological: Negative for dizziness, weakness and headaches.  Hematological: Negative for adenopathy. Does not bruise/bleed easily.  Psychiatric/Behavioral: Negative for sleep disturbance and decreased concentration.   / Past Medical History  Diagnosis Date  . Hyperlipidemia   . Migraine   . Heel spur   . Hypertension   . Allergy   . Arthritis     History   Social History  . Marital Status: Married    Spouse Name: N/A    Number of Children: N/A  . Years of Education: N/A   Occupational History  . teacher    Social History Main Topics  . Smoking status: Former Smoker    Quit date: 10/03/1981  . Smokeless tobacco: Never Used  . Alcohol Use: 2.4 oz/week    4 Glasses of wine per week  . Drug Use: No  . Sexual Activity: Yes   Other Topics Concern   . Not on file   Social History Narrative  . No narrative on file    Past Surgical History  Procedure Laterality Date  . No prior surgery      Family History  Problem Relation Age of Onset  . Heart disease Mother   . Stroke Mother   . Early death Father     accidental death  . Colon cancer Paternal Uncle 59    Allergies  Allergen Reactions  . Sulfonamide Derivatives     REACTION: Hives    Current Outpatient Prescriptions on File Prior to Visit  Medication Sig Dispense Refill  . aspirin 81 MG tablet Take 81 mg by mouth daily.        Marland Kitchen atorvastatin (LIPITOR) 40 MG tablet TAKE 1 TABLET BY MOUTH TWICE WEEKLY AS DIRECTED  30 tablet  11  . bisoprolol-hydrochlorothiazide (ZIAC) 5-6.25 MG per tablet Take 1 tablet by mouth daily.  30 tablet  11  . calcium gluconate 500 MG tablet Take 500 mg by mouth daily.        . Cholecalciferol (VITAMIN D) 1000 UNITS capsule Take 1,000 Units by mouth daily.        . fexofenadine (ALLEGRA) 60 MG tablet Take 60 mg by mouth daily.      . fish oil-omega-3 fatty acids 1000 MG capsule Take 2 g by mouth daily.        Marland Kitchen MOVIPREP 100 G SOLR moviprep as directed. No substitutions  1 kit  0  .  naproxen (NAPROSYN) 500 MG tablet TAKE 1 TABLET (500 MG TOTAL) BY MOUTH 2 (TWO) TIMES DAILY WITH A MEAL.  60 tablet  5  . tretinoin (RETIN-A) 0.025 % cream APPLY DAILY TO FACE AS DIRECTED  45 g  3  . fluticasone (FLONASE) 50 MCG/ACT nasal spray USE 1 TO 2 SPRAYS IN EACH NOSTRIL EVERY DAY  16 g  2   No current facility-administered medications on file prior to visit.    BP 136/76  Pulse 72  Temp(Src) 98.2 F (36.8 C)  Resp 16  Ht 5\' 3"  (1.6 m)  Wt 166 lb (75.297 kg)  BMI 29.41 kg/m2       Objective:   Physical Exam  Constitutional: She is oriented to person, place, and time. She appears well-developed and well-nourished. No distress.  HENT:  Head: Normocephalic and atraumatic.  Right Ear: External ear normal.  Left Ear: External ear normal.  Nose:  Nose normal.  Mouth/Throat: Oropharynx is clear and moist.  Eyes: Conjunctivae and EOM are normal. Pupils are equal, round, and reactive to light.  Neck: Normal range of motion. Neck supple. No JVD present. No tracheal deviation present. No thyromegaly present.  Cardiovascular: Normal rate, regular rhythm, normal heart sounds and intact distal pulses.   No murmur heard. Pulmonary/Chest: Effort normal and breath sounds normal. She has no wheezes. She exhibits no tenderness.  Abdominal: Soft. Bowel sounds are normal.  Musculoskeletal: Normal range of motion. She exhibits no edema and no tenderness.  Lymphadenopathy:    She has no cervical adenopathy.  Neurological: She is alert and oriented to person, place, and time. She has normal reflexes. No cranial nerve deficit.  Skin: Skin is warm and dry. She is not diaphoretic.  Psychiatric: She has a normal mood and affect. Her behavior is normal.          Assessment & Plan:  Vitamin D level will be drawn today and we will adjust her vitamin D as indicated it may be that she just needs to take her vitamin D and calcium more consistently.  She is stable her current blood pressure medicines and her lipid panel has returned to goal

## 2013-04-12 NOTE — Patient Instructions (Signed)
Sign up for mychart

## 2013-04-16 ENCOUNTER — Ambulatory Visit (AMBULATORY_SURGERY_CENTER): Payer: BC Managed Care – PPO | Admitting: Internal Medicine

## 2013-04-16 ENCOUNTER — Encounter: Payer: Self-pay | Admitting: Internal Medicine

## 2013-04-16 VITALS — BP 110/70 | HR 53 | Temp 97.3°F | Resp 16 | Ht 63.0 in | Wt 167.0 lb

## 2013-04-16 DIAGNOSIS — Z1211 Encounter for screening for malignant neoplasm of colon: Secondary | ICD-10-CM

## 2013-04-16 MED ORDER — SODIUM CHLORIDE 0.9 % IV SOLN: 500.0000 mL | INTRAVENOUS | Status: DC

## 2013-04-16 NOTE — Op Note (Signed)
[facility] [address] [place], [address]   COLONOSCOPY PROCEDURE REPORT  PATIENT: Michaela, [name]  MR#: [id number] BIRTHDATE: October 10, 1950 , 61  yrs. old GENDER: Female ENDOSCOPIST: Hart Carwin, MD REFERRED NW:GNFA Carolynn Sayers, M.D. PROCEDURE DATE:  04/16/2013 PROCEDURE:   Colonoscopy, screening First Screening Colonoscopy - Avg.  risk and is 50 yrs.  old or older - No.  Prior Negative Screening - Now for repeat screening. 10 or more years since last screening Prior Negative Screening - Now for repeat screening.  Above average risk  History of Adenoma - Now for follow-up colonoscopy & has been > or = to 3 yrs.  N/A Polyps Removed Today? No.  Recommend repeat exam, <10 yrs? No. ASA CLASS:   Class II INDICATIONS:Patient's family history of colon cancer, distant relatives and uncle with colon cancer, last colon 2007- no polyp.  MEDICATIONS: MAC sedation, administered by CRNA and Propofol (Diprivan) 180 mg IV  DESCRIPTION OF PROCEDURE:   After the risks benefits and alternatives of the procedure were thoroughly explained, informed consent was obtained.  A digital rectal exam revealed no abnormalities of the rectum.   The LB [id number]-[id number]  endoscope was introduced through the anus and advanced to the cecum, which was identified by both the appendix and ileocecal valve. No adverse events experienced.   The quality of the prep was good, using MoviPrep  The instrument was then slowly withdrawn as the colon was fully examined.      COLON FINDINGS: Mild diverticulosis was noted in the sigmoid colon. Retroflexed views revealed no abnormalities. The time to cecum=4 minutes 15 seconds.  Withdrawal time=6 minutes 100 seconds.  The scope was withdrawn and the procedure completed. COMPLICATIONS: There were no complications.  ENDOSCOPIC IMPRESSION: Mild diverticulosis was noted in the sigmoid colon  RECOMMENDATIONS: High fiber diet recall colonoscopy in 10  years   eSigned:  Hart Carwin, MD 04/16/2013 9:54 AM   cc:   PATIENT NAME:  Michaela, [name] MR#: [id number]

## 2013-04-16 NOTE — Progress Notes (Addendum)
Patient did not have preoperative order for IV antibiotic SSI prophylaxis. (G8918)  Patient did not experience any of the following events: a burn prior to discharge; a fall within the facility; wrong site/side/patient/procedure/implant event; or a hospital transfer or hospital admission upon discharge from the facility. (G8907)  

## 2013-04-16 NOTE — Patient Instructions (Addendum)
YOU HAD AN ENDOSCOPIC PROCEDURE TODAY AT THE [facility]: Refer to the procedure report that was given to you for any specific questions about what was found during the examination.  If the procedure report does not answer your questions, please call your gastroenterologist to clarify.  If you requested that your care partner not be given the details of your procedure findings, then the procedure report has been included in a sealed envelope for you to review at your convenience later.  YOU SHOULD EXPECT: Some feelings of bloating in the abdomen. Passage of more gas than usual.  Walking can help get rid of the air that was put into your GI tract during the procedure and reduce the bloating. If you had a lower endoscopy (such as a colonoscopy or flexible sigmoidoscopy) you may notice spotting of blood in your stool or on the toilet paper. If you underwent a bowel prep for your procedure, then you may not have a normal bowel movement for a few days.  DIET: Your first meal following the procedure should be a light meal and then it is ok to progress to your normal diet.  A half-sandwich or bowl of soup is an example of a good first meal.  Heavy or fried foods are harder to digest and may make you feel nauseous or bloated.  Likewise meals heavy in dairy and vegetables can cause extra gas to form and this can also increase the bloating.  Drink plenty of fluids but you should avoid alcoholic beverages for 24 hours.Try to eat a high fiber diet to preent Diverticulitis.  ACTIVITY: Your care partner should take you home directly after the procedure.  You should plan to take it easy, moving slowly for the rest of the day.  You can resume normal activity the day after the procedure however you should NOT DRIVE or use heavy machinery for 24 hours (because of the sedation medicines used during the test).    SYMPTOMS TO REPORT IMMEDIATELY: A gastroenterologist can be reached at any hour.  During normal  business hours, 8:30 AM to 5:00 PM Monday through Friday, call [phone].  After hours and on weekends, please call the GI answering service at [phone] who will take a message and have the physician on call contact you.   Following lower endoscopy (colonoscopy or flexible sigmoidoscopy):  Excessive amounts of blood in the stool  Significant tenderness or worsening of abdominal pains  Swelling of the abdomen that is new, acute  Fever of 100F or higher  FOLLOW UP: If any biopsies were taken you will be contacted by phone or by letter within the next 1-3 weeks.  Call your gastroenterologist if you have not heard about the biopsies in 3 weeks.  Our staff will call the home number listed on your records the next business day following your procedure to check on you and address any questions or concerns that you may have at that time regarding the information given to you following your procedure. This is a courtesy call and so if there is no answer at the home number and we have not heard from you through the emergency physician on call, we will assume that you have returned to your regular daily activities without incident.  SIGNATURES/CONFIDENTIALITY: You and/or your care partner have signed paperwork which will be entered into your electronic medical record.  These signatures attest to the fact that that the information above on your After Visit Summary has been reviewed and is  understood.  Full responsibility of the confidentiality of this discharge information lies with you and/or your care-partner. 

## 2013-04-16 NOTE — Progress Notes (Signed)
A/ox3 pleased with MAC, report to Suzanne RN 

## 2013-04-19 ENCOUNTER — Telehealth: Payer: Self-pay

## 2013-04-19 NOTE — Telephone Encounter (Signed)
Left a message on the pt's answering machine to call if any questions or concerns # [phone]. Maw

## 2013-06-13 ENCOUNTER — Other Ambulatory Visit: Payer: Self-pay | Admitting: Internal Medicine

## 2013-07-13 ENCOUNTER — Other Ambulatory Visit: Payer: Self-pay | Admitting: Internal Medicine

## 2013-08-02 ENCOUNTER — Encounter: Payer: Self-pay | Admitting: Internal Medicine

## 2013-10-05 ENCOUNTER — Other Ambulatory Visit: Payer: Self-pay | Admitting: Internal Medicine

## 2013-10-06 ENCOUNTER — Other Ambulatory Visit (INDEPENDENT_AMBULATORY_CARE_PROVIDER_SITE_OTHER): Payer: BC Managed Care – PPO

## 2013-10-06 DIAGNOSIS — Z0000 Encounter for general adult medical examination without abnormal findings: Secondary | ICD-10-CM

## 2013-10-06 LAB — HEPATIC FUNCTION PANEL
ALBUMIN: 4.2 g/dL (ref 3.5–5.2)
ALT: 28 U/L (ref 0–35)
AST: 20 U/L (ref 0–37)
Alkaline Phosphatase: 36 U/L — ABNORMAL LOW (ref 39–117)
BILIRUBIN DIRECT: 0.1 mg/dL (ref 0.0–0.3)
TOTAL PROTEIN: 6.6 g/dL (ref 6.0–8.3)
Total Bilirubin: 1.1 mg/dL (ref 0.3–1.2)

## 2013-10-06 LAB — POCT URINALYSIS DIPSTICK
Bilirubin, UA: NEGATIVE
Glucose, UA: NEGATIVE
KETONES UA: NEGATIVE
Leukocytes, UA: NEGATIVE
Nitrite, UA: NEGATIVE
PH UA: 5.5
Protein, UA: NEGATIVE
Spec Grav, UA: >=1.03
Urobilinogen, UA: 0.2

## 2013-10-06 LAB — CBC WITH DIFFERENTIAL/PLATELET
BASOS PCT: 0.4 % (ref 0.0–3.0)
Basophils Absolute: 0 10*3/uL (ref 0.0–0.1)
EOS PCT: 2.9 % (ref 0.0–5.0)
Eosinophils Absolute: 0.1 10*3/uL (ref 0.0–0.7)
HCT: 39.7 % (ref 36.0–46.0)
HEMOGLOBIN: 13.2 g/dL (ref 12.0–15.0)
Lymphocytes Relative: 21.8 % (ref 12.0–46.0)
Lymphs Abs: 1.1 10*3/uL (ref 0.7–4.0)
MCHC: 33.3 g/dL (ref 30.0–36.0)
MCV: 90.5 fl (ref 78.0–100.0)
MONO ABS: 0.5 10*3/uL (ref 0.1–1.0)
Monocytes Relative: 9.8 % (ref 3.0–12.0)
NEUTROS ABS: 3.3 10*3/uL (ref 1.4–7.7)
NEUTROS PCT: 65.1 % (ref 43.0–77.0)
Platelets: 175 10*3/uL (ref 150.0–400.0)
RBC: 4.39 Mil/uL (ref 3.87–5.11)
RDW: 13 % (ref 11.5–14.6)
WBC: 5.1 10*3/uL (ref 4.5–10.5)

## 2013-10-06 LAB — BASIC METABOLIC PANEL
BUN: 26 mg/dL — ABNORMAL HIGH (ref 6–23)
CHLORIDE: 105 meq/L (ref 96–112)
CO2: 31 meq/L (ref 19–32)
CREATININE: 0.8 mg/dL (ref 0.4–1.2)
Calcium: 9.4 mg/dL (ref 8.4–10.5)
GFR: 78.3 mL/min (ref >60.00)
Glucose, Bld: 80 mg/dL (ref 70–99)
POTASSIUM: 4.5 meq/L (ref 3.5–5.1)
SODIUM: 141 meq/L (ref 135–145)

## 2013-10-06 LAB — TSH: TSH: 3.31 u[IU]/mL (ref 0.35–5.50)

## 2013-10-06 LAB — LIPID PANEL
Cholesterol: 215 mg/dL — ABNORMAL HIGH (ref 0–200)
HDL: 64.5 mg/dL (ref >39.00)
LDL CALC: 131 mg/dL — AB (ref 0–99)
Total CHOL/HDL Ratio: 3
Triglycerides: 97 mg/dL (ref 0.0–149.0)
VLDL: 19.4 mg/dL (ref 0.0–40.0)

## 2013-10-13 ENCOUNTER — Ambulatory Visit (INDEPENDENT_AMBULATORY_CARE_PROVIDER_SITE_OTHER): Payer: BC Managed Care – PPO | Admitting: Internal Medicine

## 2013-10-13 ENCOUNTER — Encounter: Payer: Self-pay | Admitting: Internal Medicine

## 2013-10-13 VITALS — BP 154/94 | HR 54 | Temp 98.2°F | Ht 63.5 in | Wt 174.0 lb

## 2013-10-13 DIAGNOSIS — Z0000 Encounter for general adult medical examination without abnormal findings: Secondary | ICD-10-CM

## 2013-10-13 NOTE — Progress Notes (Signed)
Subjective:    Patient ID: Michaela [name], female    DOB: 1950-08-31, 63 y.o.   MRN: [id number]  HPI CPX   Review of Systems  Constitutional: Negative for activity change, appetite change and fatigue.  HENT: Negative for congestion, ear pain, postnasal drip and sinus pressure.   Eyes: Negative for redness and visual disturbance.  Respiratory: Negative for cough, shortness of breath and wheezing.   Gastrointestinal: Negative for abdominal pain and abdominal distention.  Genitourinary: Negative for dysuria, frequency and menstrual problem.  Musculoskeletal: Negative for arthralgias, joint swelling, myalgias and neck pain.  Skin: Negative for rash and wound.  Neurological: Negative for dizziness, weakness and headaches.  Hematological: Negative for adenopathy. Does not bruise/bleed easily.  Psychiatric/Behavioral: Negative for sleep disturbance and decreased concentration.   Past Medical History  Diagnosis Date  . Hyperlipidemia   . Migraine   . Heel spur   . Hypertension   . Allergy   . Arthritis     History   Social History  . Marital Status: Married    Spouse Name: N/A    Number of Children: N/A  . Years of Education: N/A   Occupational History  . teacher    Social History Main Topics  . Smoking status: Former Smoker    Quit date: 10/03/1981  . Smokeless tobacco: Never Used  . Alcohol Use: 2.4 oz/week    4 Glasses of wine per week  . Drug Use: No  . Sexual Activity: Yes   Other Topics Concern  . Not on file   Social History Narrative  . No narrative on file    Past Surgical History  Procedure Laterality Date  . No prior surgery      Family History  Problem Relation Age of Onset  . Heart disease Mother   . Stroke Mother   . Early death Father     accidental death  . Colon cancer Paternal Uncle 2670  . Stomach cancer Neg Hx     Allergies  Allergen Reactions  . Sulfonamide Derivatives     REACTION: Hives    Current Outpatient Prescriptions on  File Prior to Visit  Medication Sig Dispense Refill  . aspirin 81 MG tablet Take 81 mg by mouth daily.        Marland Kitchen. atorvastatin (LIPITOR) 40 MG tablet TAKE 1 TABLET BY MOUTH TWICE WEEKLY AS DIRECTED  30 tablet  10  . bisoprolol-hydrochlorothiazide (ZIAC) 5-6.25 MG per tablet TAKE 1 TABLET BY MOUTH DAILY.  30 tablet  10  . calcium gluconate 500 MG tablet Take 500 mg by mouth daily.        . Cholecalciferol (VITAMIN D) 1000 UNITS capsule Take 1,000 Units by mouth daily.        . fexofenadine (ALLEGRA) 60 MG tablet Take 60 mg by mouth daily.      . fish oil-omega-3 fatty acids 1000 MG capsule Take 2 g by mouth daily.        . fluticasone (FLONASE) 50 MCG/ACT nasal spray SHAKE WELL BEFORE EACH USE AND INSTILL 1 TO 2 SPRAYS IN EACH NOSTRIL ONCE DAILY  16 g  11  . naproxen (NAPROSYN) 500 MG tablet TAKE 1 TABLET (500 MG TOTAL) BY MOUTH 2 (TWO) TIMES DAILY WITH A MEAL.  60 tablet  4  . tretinoin (RETIN-A) 0.025 % cream APPLY ONCE DAILY TO FACE AS DIRECTED  45 g  3   No current facility-administered medications on file prior to visit.    BP  154/94  Pulse 54  Temp(Src) 98.2 F (36.8 C) (Oral)  Ht 5' 3.5" (1.613 m)  Wt 174 lb (78.926 kg)  BMI 30.34 kg/m2       Objective:   Physical Exam  Constitutional: She is oriented to person, place, and time. She appears well-developed and well-nourished. No distress.  HENT:  Head: Normocephalic and atraumatic.  Right Ear: External ear normal.  Left Ear: External ear normal.  Nose: Nose normal.  Mouth/Throat: Oropharynx is clear and moist.  Eyes: Conjunctivae and EOM are normal. Pupils are equal, round, and reactive to light.  Neck: Normal range of motion. Neck supple. No JVD present. No tracheal deviation present. No thyromegaly present.  Cardiovascular: Normal rate, regular rhythm, normal heart sounds and intact distal pulses.   No murmur heard. Pulmonary/Chest: Effort normal and breath sounds normal. She has no wheezes. She exhibits no tenderness.    Abdominal: Soft. Bowel sounds are normal.  Musculoskeletal: Normal range of motion. She exhibits no edema and no tenderness.  Lymphadenopathy:    She has no cervical adenopathy.  Neurological: She is alert and oriented to person, place, and time. She has normal reflexes. No cranial nerve deficit.  Skin: Skin is warm and dry. She is not diaphoretic.  Psychiatric: She has a normal mood and affect. Her behavior is normal.          Assessment & Plan:   This is a routine wellness  examination for this patient . I reviewed all health maintenance protocols including mammography, colonoscopy, bone density Needed referrals were placed. Age and diagnosis  appropriate screening labs were ordered. Her immunization history was reviewed and appropriate vaccinations were ordered. Her current medications and allergies were reviewed and needed refills of her chronic medications were ordered. The plan for yearly health maintenance was discussed all orders and referrals were made as appropriate. weigth is the drive of increased LDL and HTN Weight loss primary plan

## 2013-10-13 NOTE — Patient Instructions (Signed)
weight loss is the primary intervention

## 2013-10-13 NOTE — Progress Notes (Signed)
Pre visit review using our clinic review tool, if applicable. No additional management support is needed unless otherwise documented below in the visit note. 

## 2014-03-21 ENCOUNTER — Telehealth: Payer: Self-pay | Admitting: Internal Medicine

## 2014-03-21 NOTE — Telephone Encounter (Signed)
Ok. Need new pt visit - no physical at new patient visit please. Thanks.

## 2014-03-21 NOTE — Telephone Encounter (Signed)
Pt would like to switch to dr kim. Can I sch? °

## 2014-03-22 NOTE — Telephone Encounter (Signed)
Pt called back and is scheduled

## 2014-03-22 NOTE — Telephone Encounter (Signed)
lmom for pt to cb

## 2014-04-13 ENCOUNTER — Other Ambulatory Visit: Payer: Self-pay | Admitting: Internal Medicine

## 2014-04-20 ENCOUNTER — Ambulatory Visit: Payer: BC Managed Care – PPO | Admitting: Internal Medicine

## 2014-05-04 ENCOUNTER — Encounter: Payer: Self-pay | Admitting: Family Medicine

## 2014-05-04 ENCOUNTER — Ambulatory Visit (INDEPENDENT_AMBULATORY_CARE_PROVIDER_SITE_OTHER): Payer: BC Managed Care – PPO | Admitting: Family Medicine

## 2014-05-04 VITALS — BP 138/82 | HR 54 | Temp 98.1°F | Ht 63.5 in | Wt 169.0 lb

## 2014-05-04 DIAGNOSIS — E785 Hyperlipidemia, unspecified: Secondary | ICD-10-CM

## 2014-05-04 DIAGNOSIS — Z7189 Other specified counseling: Secondary | ICD-10-CM

## 2014-05-04 DIAGNOSIS — Z7689 Persons encountering health services in other specified circumstances: Secondary | ICD-10-CM

## 2014-05-04 DIAGNOSIS — J309 Allergic rhinitis, unspecified: Secondary | ICD-10-CM

## 2014-05-04 DIAGNOSIS — I10 Essential (primary) hypertension: Secondary | ICD-10-CM

## 2014-05-04 DIAGNOSIS — M1990 Unspecified osteoarthritis, unspecified site: Secondary | ICD-10-CM

## 2014-05-04 DIAGNOSIS — Z23 Encounter for immunization: Secondary | ICD-10-CM

## 2014-05-04 MED ORDER — ATORVASTATIN CALCIUM 40 MG PO TABS: 40.0000 mg | ORAL_TABLET | ORAL | Status: DC

## 2014-05-04 MED ORDER — BISOPROLOL-HYDROCHLOROTHIAZIDE 5-6.25 MG PO TABS: 1.0000 | ORAL_TABLET | Freq: Every day | ORAL | Status: DC

## 2014-05-04 NOTE — Patient Instructions (Addendum)
-  Vitamin D3 1000 IU daily   -calcium 1200mg  daily - most of this probably comes from your food - only use supplement if needed  We recommend the following healthy lifestyle measures: - eat a healthy diet consisting of lots of vegetables, fruits, beans, nuts, seeds, healthy meats such as white chicken and fish and whole grains.  - avoid fried foods, fast food, processed foods, sodas, red meet and other fattening foods.  - get a least 150 minutes of aerobic exercise per week.   -follow up for physical in December - please schedule this as you leave today, come fasting and we will do labs that day - please drink plenty of water that morning

## 2014-05-04 NOTE — Progress Notes (Signed)
Pre visit review using our clinic review tool, if applicable. No additional management support is needed unless otherwise documented below in the visit note. 

## 2014-05-04 NOTE — Addendum Note (Signed)
Addended by: Johnella MoloneyFUNDERBURK, Demetrius Barrell A on: 05/04/2014 10:11 AM   Modules accepted: Orders

## 2014-05-04 NOTE — Progress Notes (Signed)
No chief complaint on file.   HPI:  Michaela [name] is here to establish care.  Last PCP and physical: saw dr. Lovell Sheehan for yearly physicals  Has the following chronic problems and concerns today:  Patient Active Problem List   Diagnosis Date Noted  . OSTEOARTHRITIS 10/26/2007  . ALLERGIC RHINITIS 05/05/2007  . HYPERTENSION 04/21/2007  . HYPERLIPIDEMIA 02/26/2007   HTN: -meds: asa, bisoprolol-hctz (ziac) 5-6.25 -denies: CP, SOB, DOE, swelling  HLD: -meds: lipitor 40mg  twice weekly -denies: CP, leg cramps, cog changes  Allergic Rhinitis: -meds: fluticasone, allegra - seasonally, had allergy testing -denies: wheezing, SOB  Vit D insufficiency: -meds include calcium and vitamin D  OA: -takes naproxen once daily for this -stiff in the morning  HM: -flu vaccine offered - she wants to do this  ROS negative for unless reported above: fevers, unintentional weight loss, hearing or vision loss, chest pain, palpitations, struggling to breath, hemoptysis, melena, hematochezia, hematuria, falls, loc, si, thoughts of self harm  Past Medical History  Diagnosis Date  . Hyperlipidemia   . Migraine   . Heel spur   . Hypertension   . Allergy   . Arthritis     Family History  Problem Relation Age of Onset  . Heart disease Mother   . Stroke Mother   . Early death Father     accidental death - trauma  . Colon cancer Paternal Uncle 44  . Stomach cancer Neg Hx     History   Social History  . Marital Status: Married    Spouse Name: N/A    Number of Children: N/A  . Years of Education: N/A   Occupational History  . teacher    Social History Main Topics  . Smoking status: Former Smoker    Quit date: 10/03/1981  . Smokeless tobacco: Never Used     Comment: smoked remotely for 10 years, quit in 1983  . Alcohol Use: Yes     Comment: 3 glasses about 4 days per week  . Drug Use: No  . Sexual Activity: Yes   Other Topics Concern  . 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  , Disp: , Rfl: ;  atorvastatin (LIPITOR) 40 MG tablet, Take 1 tablet (40 mg total) by mouth 2 (two) times a week., Disp: 30 tablet, Rfl: 3;  bisoprolol-hydrochlorothiazide (ZIAC) 5-6.25 MG per tablet, Take 1 tablet by mouth daily., Disp: 90 tablet, Rfl: 3;  calcium gluconate 500 MG tablet, Take 500 mg by mouth daily.  , Disp: , Rfl:  Cholecalciferol (VITAMIN D) 1000 UNITS capsule, Take 1,000 Units by mouth daily.  , Disp: , Rfl: ;  fexofenadine (ALLEGRA) 60 MG tablet, Take 60 mg by mouth daily., Disp: , Rfl: ;  fish oil-omega-3 fatty acids 1000 MG capsule, Take 2 g by mouth daily.  , Disp: , Rfl: ;  fluticasone (FLONASE) 50 MCG/ACT nasal spray, SHAKE WELL BEFORE EACH USE AND INSTILL 1 TO 2 SPRAYS IN EACH NOSTRIL ONCE DAILY, Disp: 16 g, Rfl: 11 naproxen (NAPROSYN) 500 MG tablet, TAKE 1  TABLET (500 MG TOTAL) BY MOUTH 2 (TWO) TIMES DAILY WITH A MEAL., Disp: 30 tablet, Rfl: 0;  tretinoin (RETIN-A) 0.025 % cream, APPLY ONCE DAILY TO FACE AS DIRECTED, Disp: 45 g, Rfl: 3  EXAM:  Filed Vitals:   05/04/14 0915  BP: 138/82  Pulse: 54  Temp: 98.1 F (36.7 C)    Body mass index is 29.46 kg/(m^2).  GENERAL: vitals reviewed and listed above, alert, oriented, appears well hydrated and in no acute distress  HEENT: atraumatic, conjunttiva clear, no obvious abnormalities on inspection of external nose and ears  NECK: no obvious masses on inspection  LUNGS: clear to auscultation bilaterally, no wheezes, rales or rhonchi, good air movement  CV: HRRR, no peripheral edema  MS: moves all extremities without noticeable abnormality  PSYCH: pleasant and  cooperative, no obvious depression or anxiety  ASSESSMENT AND PLAN:  Discussed the following assessment and plan:  HYPERLIPIDEMIA - Plan: atorvastatin (LIPITOR) 40 MG tablet  Encounter to establish care with new doctor  HYPERTENSION - Plan: bisoprolol-hydrochlorothiazide (ZIAC) 5-6.25 MG per tablet  ALLERGIC RHINITIS  OSTEOARTHRITIS  -We reviewed the PMH, PSH, FH, SH, Meds and Allergies. -We provided refills for any medications we will prescribe as needed. -We addressed current concerns per orders and patient instructions. -We have asked for records for pertinent exams, studies, vaccines and notes from previous providers. -We have advised patient to follow up per instructions below. -flu shot today -she prefers to do labs at physical - advised to shcedule  -Patient advised to return or notify a doctor immediately if symptoms worsen or persist or new concerns arise.  Patient Instructions  -Vitamin D3 1000 IU daily   -calcium 1200mg  daily - most of this probably comes from your food - only use supplement if needed  We recommend the following healthy lifestyle measures: - eat a healthy diet consisting of lots of vegetables, fruits, beans, nuts, seeds, healthy meats such as white chicken and fish and whole grains.  - avoid fried foods, fast food, processed foods, sodas, red meet and other fattening foods.  - get a least 150 minutes of aerobic exercise per week.   -follow up for physical in December - please schedule this as you leave today, come fasting and we will do labs that day - please drink plenty of water that morning       Warnie Belair R.

## 2014-05-05 ENCOUNTER — Telehealth: Payer: Self-pay | Admitting: Family Medicine

## 2014-05-05 NOTE — Telephone Encounter (Signed)
emmi emailed °

## 2014-06-21 ENCOUNTER — Telehealth: Payer: Self-pay | Admitting: Family Medicine

## 2014-06-21 MED ORDER — FLUTICASONE PROPIONATE 50 MCG/ACT NA SUSP: NASAL | Status: DC

## 2014-06-21 NOTE — Telephone Encounter (Signed)
Sent!

## 2014-06-21 NOTE — Telephone Encounter (Signed)
[facility] [place] [address] [place] [phone] is requesting re-fill on fluticasone (FLONASE) 50 MCG/ACT nasal spray

## 2014-07-06 ENCOUNTER — Other Ambulatory Visit: Payer: Self-pay | Admitting: Internal Medicine

## 2014-07-21 LAB — HM MAMMOGRAPHY

## 2014-07-22 ENCOUNTER — Encounter: Payer: Self-pay | Admitting: Family Medicine

## 2014-07-26 ENCOUNTER — Other Ambulatory Visit (HOSPITAL_COMMUNITY)
Admission: RE | Admit: 2014-07-26 | Discharge: 2014-07-26 | Disposition: A | Discharge status: Home / Self Care | Payer: BC Managed Care – PPO | Source: Ambulatory Visit | Attending: Family Medicine | Admitting: Family Medicine

## 2014-07-26 ENCOUNTER — Encounter: Payer: Self-pay | Admitting: Family Medicine

## 2014-07-26 ENCOUNTER — Ambulatory Visit (INDEPENDENT_AMBULATORY_CARE_PROVIDER_SITE_OTHER): Payer: BC Managed Care – PPO | Admitting: Family Medicine

## 2014-07-26 VITALS — BP 140/84 | HR 57 | Temp 98.1°F | Ht 62.25 in | Wt 171.8 lb

## 2014-07-26 DIAGNOSIS — Z124 Encounter for screening for malignant neoplasm of cervix: Secondary | ICD-10-CM

## 2014-07-26 DIAGNOSIS — Z01419 Encounter for gynecological examination (general) (routine) without abnormal findings: Secondary | ICD-10-CM

## 2014-07-26 DIAGNOSIS — Z1151 Encounter for screening for human papillomavirus (HPV): Secondary | ICD-10-CM | POA: Insufficient documentation

## 2014-07-26 DIAGNOSIS — I10 Essential (primary) hypertension: Secondary | ICD-10-CM

## 2014-07-26 DIAGNOSIS — E785 Hyperlipidemia, unspecified: Secondary | ICD-10-CM

## 2014-07-26 DIAGNOSIS — Z23 Encounter for immunization: Secondary | ICD-10-CM

## 2014-07-26 LAB — LIPID PANEL
Cholesterol: 208 mg/dL — ABNORMAL HIGH (ref 0–200)
HDL: 56.6 mg/dL (ref >39.00)
LDL Cholesterol: 118 mg/dL — ABNORMAL HIGH (ref 0–99)
NonHDL: 151.4
TRIGLYCERIDES: 165 mg/dL — AB (ref 0.0–149.0)
Total CHOL/HDL Ratio: 4
VLDL: 33 mg/dL (ref 0.0–40.0)

## 2014-07-26 NOTE — Patient Instructions (Addendum)
BEFORE YOU LEAVE: -prevnar 13 -labs -follow up in 4-6 months  -vit D3 1000 IU [phone] IU; calcium 1200mg  from diet daily - supplement only if needed  We recommend the following healthy lifestyle measures: - eat a healthy diet consisting of lots of vegetables, fruits, beans, nuts, seeds, healthy meats such as white chicken and fish and whole grains.  - avoid fried foods, fast food, processed foods, sodas, red meet and other fattening foods.  - get a least 150 minutes of aerobic exercise per week.

## 2014-07-26 NOTE — Progress Notes (Signed)
Pre visit review using our clinic review tool, if applicable. No additional management support is needed unless otherwise documented below in the visit note. 

## 2014-07-26 NOTE — Progress Notes (Signed)
HPI:  Here for pap and labs  She is due for her pap. She has HLD - on statin, HTN treated and allergies. Reports she feels well in general. Has had pain in R ankle after possible injury on treadmill - has some swelling intermittently in this ankle and pain with certain activities. She reports has mild OA and has back exercise she does for this. From time to time has some low back pain and radiation into L LE. Denies bowel or bladder incontinence, weakness and numbness. See ROS below.  -Concerns and/or follow up today: none  -Diet: variety of foods, balance and well rounded, larger portion sizes  -Exercise: no regular exercise  -Taking folic acid, vitamin D or calcium: no  -Diabetes and Dyslipidemia Screening: done  -Hx of HTN: no  -Vaccines: UTD  -pap history:07/2011 normal  -FDLMP: n/a  -sexual activity: yes, female partner, no new partners  -wants STI testing: no  -FH breast, colon or ovarian ca: see FH Last mammogram: last week  Last colon cancer screening: UTD per her report  Breast Ca Risk Assessment: -no first degree relatives with breast or ovarian ca  -Alcohol, Tobacco, drug use: see social history  Review of Systems - no fevers, unintentional weight loss, vision loss, hearing loss, chest pain, sob, hemoptysis, melena, hematochezia, hematuria, genital discharge, changing or concerning skin lesions, bleeding, bruising, loc, thoughts of self harm or SI  Past Medical History  Diagnosis Date  . Hyperlipidemia   . Migraine   . Heel spur   . Hypertension   . Allergy   . Arthritis     Past Surgical History  Procedure Laterality Date  . No prior surgery      Family History  Problem Relation Age of Onset  . Heart disease Mother   . Stroke Mother   . Early death Father     accidental death - trauma  . Colon cancer Paternal Uncle [id number]  . Stomach cancer Neg Hx     History   Social History  . Marital Status: Married    Spouse Name: N/A    Number of  Children: N/A  . Years of Education: N/A   Occupational History  . teacher    Social History Main Topics  . Smoking status: Former Smoker    Quit date: 10/03/1981  . Smokeless tobacco: Never Used     Comment: smoked remotely for 10 years, quit in 1983  . Alcohol Use: Yes     Comment: 3 glasses about 4 days per week  . Drug Use: No  . Sexual Activity: Yes   Other Topics Concern  . 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  , Disp: , Rfl:  fish oil-omega-3 fatty acids 1000 MG capsule, Take 2 g by mouth daily.  ,  Disp: , Rfl: ;  fluticasone (FLONASE) 50 MCG/ACT nasal spray, SHAKE WELL BEFORE EACH USE AND INSTILL 1 TO 2 SPRAYS IN EACH NOSTRIL ONCE DAILY, Disp: 16 g, Rfl: 11;  Multiple Vitamin (MULTIVITAMIN) capsule, Take 1 capsule by mouth daily., Disp: , Rfl: ;  tretinoin (RETIN-A) 0.025 % cream, APPLY ONCE DAILY TO FACE AS DIRECTED, Disp: 45 g, Rfl: 3 fexofenadine (ALLEGRA) 60 MG tablet, Take 60 mg by mouth daily., Disp: , Rfl:   EXAM:  Filed Vitals:   07/26/14 0816  BP: 140/84  Pulse: 57  Temp: 98.1 F (36.7 C)    GENERAL: vitals reviewed and listed below, alert, oriented, appears well hydrated and in no acute distress  HEENT: head atraumatic, PERRLA, normal appearance of eyes, ears, nose and mouth. moist mucus membranes.  NECK: supple, no masses or  lymphadenopathy  LUNGS: clear to auscultation bilaterally, no rales, rhonchi or wheeze  CV: HRRR, no peripheral edema or cyanosis, normal pedal pulses  BREAST: normal appearance - no lesions or discharge, on palpation normal breast tissue without any suspicious masses  ABDOMEN: bowel sounds normal, soft, non tender to palpation, no masses, no rebound or guarding  GU: normal appearance of external genitalia - no lesions or masses, normal vaginal mucosa - no abnormal discharge, normal appearance of cervix - no lesions or abnormal discharge, no masses or tenderness on palpation of uterus and ovaries.  RECTAL: refused  SKIN: no rash or abnormal lesions  MS: normal gait, moves all extremities normally  NEURO: CN II-XII grossly intact, normal muscle strength and sensation to light touch on extremities  PSYCH: normal affect, pleasant and cooperative  ASSESSMENT AND PLAN:  Discussed the following assessment and plan:  There are no diagnoses linked to this encounter.  -gyn exam today - pap obtained  -Discussed and advised all [place] preventive services health task force level A and B recommendations for age, sex and risks.  -Advised at least 150 minutes of exercise per week and a healthy diet low in saturated fats and sweets and consisting of fresh fruits and vegetables, lean meats such as fish and white chicken and whole grains.  -FASTING labs, studies and vaccines per orders this encounter  Orders Placed This Encounter  Procedures  . Lipid Panel    Patient advised to return to clinic immediately if symptoms worsen or persist or new concerns.  Patient Instructions  BEFORE YOU LEAVE: -prevnar 13 -labs -follow up in 4-6 months  -vit D3 1000 IU [phone] IU; calcium 1200mg  from diet daily - supplement only if needed  We recommend the following healthy lifestyle measures: - eat a healthy diet consisting of lots of vegetables, fruits, beans, nuts, seeds, healthy meats such as white  chicken and fish and whole grains.  - avoid fried foods, fast food, processed foods, sodas, red meet and other fattening foods.  - get a least 150 minutes of aerobic exercise per week.      Return if symptoms worsen or fail to improve.  Kriste BasqueKIM, Monasia Lair R.

## 2014-07-26 NOTE — Addendum Note (Signed)
Addended by: Johnella MoloneyFUNDERBURK, Brecken Walth A on: 07/26/2014 09:38 AM   Modules accepted: Orders

## 2014-07-26 NOTE — Addendum Note (Signed)
Addended by: Johnella MoloneyFUNDERBURK, Darin Arndt A on: 07/26/2014 09:28 AM   Modules accepted: Orders

## 2014-07-27 ENCOUNTER — Encounter: Payer: Self-pay | Admitting: Family Medicine

## 2014-07-27 LAB — CYTOLOGY - PAP

## 2015-01-25 ENCOUNTER — Ambulatory Visit (INDEPENDENT_AMBULATORY_CARE_PROVIDER_SITE_OTHER): Payer: BC Managed Care – PPO | Admitting: Family Medicine

## 2015-01-25 ENCOUNTER — Encounter: Payer: Self-pay | Admitting: Family Medicine

## 2015-01-25 VITALS — BP 128/80 | HR 55 | Temp 97.9°F | Ht 62.25 in | Wt 165.5 lb

## 2015-01-25 DIAGNOSIS — I10 Essential (primary) hypertension: Secondary | ICD-10-CM

## 2015-01-25 DIAGNOSIS — L709 Acne, unspecified: Secondary | ICD-10-CM

## 2015-01-25 DIAGNOSIS — M1990 Unspecified osteoarthritis, unspecified site: Secondary | ICD-10-CM | POA: Diagnosis not present

## 2015-01-25 DIAGNOSIS — E785 Hyperlipidemia, unspecified: Secondary | ICD-10-CM

## 2015-01-25 DIAGNOSIS — E669 Obesity, unspecified: Secondary | ICD-10-CM

## 2015-01-25 DIAGNOSIS — J309 Allergic rhinitis, unspecified: Secondary | ICD-10-CM

## 2015-01-25 LAB — LIPID PANEL
CHOL/HDL RATIO: 3
Cholesterol: 209 mg/dL — ABNORMAL HIGH (ref 0–200)
HDL: 61.8 mg/dL (ref >39.00)
LDL Cholesterol: 124 mg/dL — ABNORMAL HIGH (ref 0–99)
NONHDL: 147.2
Triglycerides: 114 mg/dL (ref 0.0–149.0)
VLDL: 22.8 mg/dL (ref 0.0–40.0)

## 2015-01-25 LAB — BASIC METABOLIC PANEL
BUN: 18 mg/dL (ref 6–23)
CALCIUM: 10.2 mg/dL (ref 8.4–10.5)
CO2: 29 mEq/L (ref 19–32)
Chloride: 106 mEq/L (ref 96–112)
Creatinine, Ser: 1.22 mg/dL — ABNORMAL HIGH (ref 0.40–1.20)
GFR: 47.22 mL/min — ABNORMAL LOW (ref >60.00)
GLUCOSE: 89 mg/dL (ref 70–99)
POTASSIUM: 4.8 meq/L (ref 3.5–5.1)
SODIUM: 140 meq/L (ref 135–145)

## 2015-01-25 MED ORDER — TRETINOIN 0.025 % EX CREA: TOPICAL_CREAM | CUTANEOUS | Status: DC

## 2015-01-25 NOTE — Progress Notes (Signed)
Pre visit review using our clinic review tool, if applicable. No additional management support is needed unless otherwise documented below in the visit note. 

## 2015-01-25 NOTE — Patient Instructions (Addendum)
BEFORE YOU LEAVE: -labs -CPE in December  -We have ordered labs or studies at this visit. It can take up to 1-2 weeks for results and processing. We will contact you with instructions IF your results are abnormal. Normal results will be released to your [facility]. If you have not heard from [place] or can not find your results in [facility] in 2 weeks please contact our office.  -PLEASE SIGN UP FOR MYCHART TODAY   We recommend the following healthy lifestyle measures: - eat a healthy diet consisting of lots of vegetables, fruits, beans, nuts, seeds, healthy meats such as white chicken and fish and whole grains.  - avoid starches, sweets, fried foods, fast food, processed foods, sodas, red meet and other fattening foods.  - get a least 150 minutes of aerobic exercise per week.

## 2015-01-25 NOTE — Progress Notes (Signed)
HPI:  Follow up:  HTN: -meds: bisoprolol-hctz 5-6.25 -denies: CP, SOB, swelling, HA -stable  HLD: -meds: atorvastatin 40, fish oil -denies: leg cramps, cog changes -has cut back on alcohol and is not drinking at all, trying to stay away from sweets -stable  Seasonal alllergies: -meds: allegra, flonase - only takes these in the spring and fall for primarily nasal symptoms -denies: wheezing, hx astham  Adult acne: -uses retin A for this - needs refill -uses good sun protection -well controlled   OA of back:  ROS: See pertinent positives and negatives per HPI.  Past Medical History  Diagnosis Date  . Hyperlipidemia   . Migraine   . Heel spur   . Hypertension   . Allergy   . Arthritis     osteoarthritis  . Foot deformity     saw podiatrist remotely    Past Surgical History  Procedure Laterality Date  . No prior surgery      Family History  Problem Relation Age of Onset  . Heart disease Mother   . Stroke Mother   . Early death Father     accidental death - trauma  . Colon cancer Paternal Uncle 59  . Stomach cancer Neg Hx     History   Social History  . Marital Status: Married    Spouse Name: N/A  . Number of Children: N/A  . Years of Education: N/A   Occupational History  . teacher    Social History Main Topics  . Smoking status: Former Smoker    Quit date: 10/03/1981  . Smokeless tobacco: Never Used     Comment: smoked remotely for 10 years, quit in 1983  . Alcohol Use: Yes     Comment: 3 glasses about 4 days per week  . Drug Use: No  . Sexual Activity: Yes   Other Topics Concern  . 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   atorvastatin (LIPITOR) 40 MG tablet, Take 1 tablet (40 mg total) by mouth 2 (two) times a week., Disp: 30 tablet, Rfl: 3 .  bisoprolol-hydrochlorothiazide (ZIAC) 5-6.25 MG per tablet, Take 1 tablet by mouth daily., Disp: 90 tablet, Rfl: 3 .  Cholecalciferol (VITAMIN D) 1000 UNITS capsule, Take 1,000 Units by mouth daily.  , Disp: , Rfl:  .  fexofenadine (ALLEGRA) 60 MG tablet, Take 60 mg by mouth daily., Disp: , Rfl:  .  fluticasone (FLONASE) 50 MCG/ACT nasal spray, SHAKE WELL BEFORE EACH USE AND INSTILL 1 TO 2 SPRAYS IN EACH NOSTRIL ONCE DAILY, Disp: 16 g, Rfl: 11 .  Multiple Vitamin (MULTIVITAMIN) capsule, Take 1 capsule by mouth daily., Disp: , Rfl:  .  tretinoin (RETIN-A) 0.025 % cream, APPLY ONCE DAILY TO FACE AS DIRECTED, Disp: 45 g, Rfl: 3  EXAM:  Filed Vitals:   01/25/15 0844  BP: 128/80  Pulse: 55  Temp: 97.9 F (36.6 C)    Body mass index is 30.03 kg/(m^2).  GENERAL: vitals reviewed and listed above, alert, oriented, appears well hydrated and in no acute distress  HEENT: atraumatic, conjunttiva clear, no obvious abnormalities  on inspection of external nose and ears  NECK: no obvious masses on inspection  LUNGS: clear to auscultation bilaterally, no wheezes, rales or rhonchi, good air movement  CV: HRRR, no peripheral edema  MS: moves all extremities without noticeable abnormality  PSYCH: pleasant and cooperative, no obvious depression or anxiety  ASSESSMENT AND PLAN:  Discussed the following assessment and plan:  Essential hypertension - Plan: Basic metabolic panel  Hyperlipemia - Plan: Lipid Panel  Osteoarthritis, unspecified osteoarthritis type, unspecified site  Allergic rhinitis, unspecified allergic rhinitis type  Acne, unspecified acne type - Plan: tretinoin (RETIN-A) 0.025 % cream  Obesity  -Patient advised to return or notify a doctor immediately if symptoms worsen or persist or new concerns arise.  Patient Instructions  BEFORE YOU  LEAVE: -labs -CPE in December  -We have ordered labs or studies at this visit. It can take up to 1-2 weeks for results and processing. We will contact you with instructions IF your results are abnormal. Normal results will be released to your [facility]. If you have not heard from [place] or can not find your results in [facility] in 2 weeks please contact our office.  -PLEASE SIGN UP FOR MYCHART TODAY   We recommend the following healthy lifestyle measures: - eat a healthy diet consisting of lots of vegetables, fruits, beans, nuts, seeds, healthy meats such as white chicken and fish and whole grains.  - avoid starches, sweets, fried foods, fast food, processed foods, sodas, red meet and other fattening foods.  - get a least 150 minutes of aerobic exercise per week.       Kriste Basque R.

## 2015-01-27 ENCOUNTER — Encounter: Payer: Self-pay | Admitting: Family Medicine

## 2015-01-27 ENCOUNTER — Encounter: Payer: Self-pay | Admitting: *Deleted

## 2015-01-27 ENCOUNTER — Other Ambulatory Visit: Payer: Self-pay | Admitting: *Deleted

## 2015-01-27 DIAGNOSIS — R899 Unspecified abnormal finding in specimens from other organs, systems and tissues: Secondary | ICD-10-CM

## 2015-02-10 ENCOUNTER — Other Ambulatory Visit (INDEPENDENT_AMBULATORY_CARE_PROVIDER_SITE_OTHER): Payer: BC Managed Care – PPO

## 2015-02-10 DIAGNOSIS — R899 Unspecified abnormal finding in specimens from other organs, systems and tissues: Secondary | ICD-10-CM

## 2015-02-10 LAB — BASIC METABOLIC PANEL
BUN: 13 mg/dL (ref 6–23)
CALCIUM: 9.4 mg/dL (ref 8.4–10.5)
CO2: 27 mEq/L (ref 19–32)
CREATININE: 0.69 mg/dL (ref 0.40–1.20)
Chloride: 100 mEq/L (ref 96–112)
GFR: 91.14 mL/min (ref >60.00)
GLUCOSE: 83 mg/dL (ref 70–99)
POTASSIUM: 3.4 meq/L — AB (ref 3.5–5.1)
Sodium: 137 mEq/L (ref 135–145)

## 2015-02-13 ENCOUNTER — Encounter: Payer: Self-pay | Admitting: Family Medicine

## 2015-02-14 NOTE — Telephone Encounter (Signed)
I left a detailed message stating the kidney function is back to normal and this was sent to her Mychart acct and that Dr Selena BattenKim will address this next week to see if she can restart the probiotic or not.

## 2015-05-11 ENCOUNTER — Ambulatory Visit (INDEPENDENT_AMBULATORY_CARE_PROVIDER_SITE_OTHER): Payer: BC Managed Care – PPO

## 2015-05-11 DIAGNOSIS — Z23 Encounter for immunization: Secondary | ICD-10-CM | POA: Diagnosis not present

## 2015-05-23 ENCOUNTER — Other Ambulatory Visit: Payer: Self-pay | Admitting: Family Medicine

## 2015-06-05 ENCOUNTER — Other Ambulatory Visit: Payer: Self-pay | Admitting: Family Medicine

## 2015-08-05 ENCOUNTER — Other Ambulatory Visit: Payer: Self-pay | Admitting: Family Medicine

## 2015-08-14 ENCOUNTER — Ambulatory Visit: Payer: BC Managed Care – PPO | Admitting: Family Medicine

## 2015-08-15 ENCOUNTER — Encounter: Payer: BC Managed Care – PPO | Admitting: Family Medicine

## 2015-08-16 LAB — HM MAMMOGRAPHY

## 2015-08-24 ENCOUNTER — Encounter: Payer: Self-pay | Admitting: Family Medicine

## 2015-08-24 ENCOUNTER — Ambulatory Visit (INDEPENDENT_AMBULATORY_CARE_PROVIDER_SITE_OTHER): Payer: BC Managed Care – PPO | Admitting: Family Medicine

## 2015-08-24 VITALS — BP 102/70 | HR 55 | Temp 98.2°F | Ht 63.0 in | Wt 158.7 lb

## 2015-08-24 DIAGNOSIS — E785 Hyperlipidemia, unspecified: Secondary | ICD-10-CM | POA: Diagnosis not present

## 2015-08-24 DIAGNOSIS — Z0000 Encounter for general adult medical examination without abnormal findings: Secondary | ICD-10-CM | POA: Diagnosis not present

## 2015-08-24 DIAGNOSIS — I10 Essential (primary) hypertension: Secondary | ICD-10-CM | POA: Diagnosis not present

## 2015-08-24 LAB — LIPID PANEL
CHOLESTEROL: 227 mg/dL — AB (ref 0–200)
HDL: 74.2 mg/dL (ref >39.00)
LDL CALC: 136 mg/dL — AB (ref 0–99)
NONHDL: 152.66
Total CHOL/HDL Ratio: 3
Triglycerides: 84 mg/dL (ref 0.0–149.0)
VLDL: 16.8 mg/dL (ref 0.0–40.0)

## 2015-08-24 LAB — BASIC METABOLIC PANEL
BUN: 16 mg/dL (ref 6–23)
CHLORIDE: 101 meq/L (ref 96–112)
CO2: 30 mEq/L (ref 19–32)
CREATININE: 0.71 mg/dL (ref 0.40–1.20)
Calcium: 9.6 mg/dL (ref 8.4–10.5)
GFR: 88.04 mL/min (ref >60.00)
GLUCOSE: 85 mg/dL (ref 70–99)
Potassium: 4 mEq/L (ref 3.5–5.1)
Sodium: 140 mEq/L (ref 135–145)

## 2015-08-24 NOTE — Progress Notes (Signed)
Pre visit review using our clinic review tool, if applicable. No additional management support is needed unless otherwise documented below in the visit note. 

## 2015-08-24 NOTE — Progress Notes (Signed)
HPI:  Here for CPE:  -Concerns and/or follow up today:  HLD/obesity: -meds: atorvastatin 40 2 days per week, fish oil -denies: leg cramps, cog changes -has cut back on alcohol and is not drinking at all, trying to stay away from sweets - walking 3 miles 3 days per week - has lost some wt and no longer in obese range -stable  HTN: -meds: bisoprolol-hctz - very small dose -no CP, SOB, DOE  Seasonal alllergies: -meds: allegra, flonase - only takes these in the spring and fall for primarily nasal symptoms -denies: wheezing, hx astham  Adult acne: -uses retin A for this - needs refill -uses good sun protection -well controlled   OA of back:  -Diet: variety of foods, balance and well rounded -Exercise: regular exercise  -Diabetes and Dyslipidemia Screening: FASTING for labs today  -Vaccines: UTD  -pap history:07/2014 and normal  -wants STI testing (Hep C if born [birth year]): agreed to hep c screen  -FH breast, colon or ovarian ca: see FH Last mammogram:last week Last colon cancer screening: utd - reports told repeat in 10 years  -Alcohol, Tobacco, drug use: see social history  Review of Systems - no fevers, unintentional weight loss, vision loss, hearing loss, chest pain, sob, hemoptysis, melena, hematochezia, hematuria, genital discharge, changing or concerning skin lesions, bleeding, bruising, loc, thoughts of self harm or SI  Past Medical History  Diagnosis Date  . Hyperlipidemia   . Migraine   . Heel spur   . Hypertension   . Allergy   . Arthritis     osteoarthritis  . Foot deformity     saw podiatrist remotely    Past Surgical History  Procedure Laterality Date  . No prior surgery      Family History  Problem Relation Age of Onset  . Heart disease Mother   . Stroke Mother   . Early death Father     accidental death - trauma  . Colon cancer Paternal Uncle 42  . Stomach cancer Neg Hx     Social History   Social History  . Marital Status:  Married    Spouse Name: N/A  . Number of Children: N/A  . Years of Education: N/A   Occupational History  . teacher    Social History Main Topics  . Smoking status: Former Smoker    Quit date: 10/03/1981  . Smokeless tobacco: Never Used     Comment: smoked remotely for 10 years, quit in 1983  . Alcohol Use: Yes     Comment: 3 glasses about 4 days per week  . Drug Use: No  . Sexual Activity: Yes   Other Topics Concern  . 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  atorvastatin (LIPITOR) 40 MG tablet, TAKE 1 TABLET (40 MG TOTAL) BY MOUTH 2 (TWO) TIMES A WEEK., Disp: 30 tablet, Rfl: 2 .  bisoprolol-hydrochlorothiazide (ZIAC) 5-6.25 MG tablet, TAKE 1 TABLET BY MOUTH DAILY., Disp: 30 tablet, Rfl: 2 .  fexofenadine (ALLEGRA) 60 MG tablet, Take 60 mg by mouth daily., Disp: , Rfl:  .  fluticasone (FLONASE) 50  MCG/ACT nasal spray, SHAKE WELL BEFORE EACH USE AND INSTILL 1 TO 2 SPRAYS IN EACH NOSTRIL ONCE DAILY, Disp: 16 g, Rfl: 1 .  Probiotic Product (PROBIOTIC DAILY PO), Take by mouth., Disp: , Rfl:  .  tretinoin (RETIN-A) 0.025 % cream, APPLY ONCE DAILY TO FACE AS DIRECTED, Disp: 45 g, Rfl: 3  EXAM:  Filed Vitals:   08/24/15 0904  BP: 102/70  Pulse: 55  Temp: 98.2 F (36.8 C)    GENERAL: vitals reviewed and listed below, alert, oriented, appears well hydrated and in no acute distress  HEENT: head atraumatic, PERRLA, normal appearance of eyes, ears, nose and mouth. moist mucus membranes.  NECK: supple, no masses or lymphadenopathy  LUNGS: clear to auscultation bilaterally, no rales, rhonchi or wheeze  CV: HRRR, no peripheral edema or cyanosis, normal pedal pulses  BREAST:  declined, agreed to do self breast exams  ABDOMEN: bowel sounds normal, soft, non tender to palpation, no masses, no rebound or guarding  GU: declined  SKIN: declined full skin check  MS: normal gait, moves all extremities normally  NEURO: CN II-XII grossly intact, normal muscle strength and sensation to light touch on extremities  PSYCH: normal affect, pleasant and cooperative  ASSESSMENT AND PLAN:  Discussed the following assessment and plan:  Visit for preventive health examination - Plan: Hep C Antibody  Hyperlipemia - Plan: Lipid Panel  Essential hypertension - Plan: Basic metabolic panel   -Discussed and advised all [place] preventive services health task force level A and B recommendations for age, sex and risks.  -Advised at least 150 minutes of exercise per week and a healthy diet low in saturated fats and sweets and consisting of fresh fruits and vegetables, lean meats such as fish and white chicken and whole grains.  -labs, studies and vaccines per orders this encounter  Orders Placed This Encounter  Procedures  . Basic metabolic panel  . Lipid Panel  . Hep C Antibody    Patient advised to return to clinic immediately if symptoms worsen or persist or new concerns.  Patient Instructions  BEFORE YOU LEAVE: -labs -follow up in 4-6 months  -We have ordered labs or studies at this visit. It can take up to 1-2 weeks for results and processing. We will contact you with instructions IF your results are abnormal. Normal results will be released to your [facility]. If you have not heard from [place] or can not find your results in [facility] in 2 weeks please contact our office.  We recommend the following healthy lifestyle measures: - eat a healthy whole foods diet consisting of regular small meals composed of vegetables, fruits, beans, nuts, seeds, healthy meats such as white chicken and fish and whole grains.  - avoid sweets, white starchy foods, fried foods, fast food, processed  foods, sodas, red meet and other fattening foods.  - get a least 150-300 minutes of aerobic exercise per week.           No Follow-up on file.  Kriste Basque R.

## 2015-08-24 NOTE — Patient Instructions (Signed)
BEFORE YOU LEAVE: -labs -follow up in 4-6 months  -We have ordered labs or studies at this visit. It can take up to 1-2 weeks for results and processing. We will contact you with instructions IF your results are abnormal. Normal results will be released to your MYCHART. If you have not heard from us or can not find your results in MYCHART in 2 weeks please contact our office.  We recommend the following healthy lifestyle measures: - eat a healthy whole foods diet consisting of regular small meals composed of vegetables, fruits, beans, nuts, seeds, healthy meats such as white chicken and fish and whole grains.  - avoid sweets, white starchy foods, fried foods, fast food, processed foods, sodas, red meet and other fattening foods.  - get a least 150-300 minutes of aerobic exercise per week.        

## 2015-08-25 LAB — HEPATITIS C ANTIBODY: HCV AB: NEGATIVE

## 2015-08-31 ENCOUNTER — Encounter: Payer: Self-pay | Admitting: Family Medicine

## 2015-10-02 ENCOUNTER — Other Ambulatory Visit: Payer: Self-pay | Admitting: Family Medicine

## 2016-02-05 ENCOUNTER — Ambulatory Visit: Payer: BC Managed Care – PPO | Admitting: Family Medicine

## 2016-02-14 ENCOUNTER — Ambulatory Visit: Payer: BC Managed Care – PPO | Admitting: Family Medicine

## 2016-02-15 ENCOUNTER — Encounter: Payer: Self-pay | Admitting: Family Medicine

## 2016-02-15 ENCOUNTER — Ambulatory Visit (INDEPENDENT_AMBULATORY_CARE_PROVIDER_SITE_OTHER): Payer: BC Managed Care – PPO | Admitting: Family Medicine

## 2016-02-15 VITALS — BP 120/80 | HR 52 | Temp 98.0°F | Ht 63.0 in | Wt 163.6 lb

## 2016-02-15 DIAGNOSIS — E785 Hyperlipidemia, unspecified: Secondary | ICD-10-CM | POA: Diagnosis not present

## 2016-02-15 DIAGNOSIS — Z23 Encounter for immunization: Secondary | ICD-10-CM | POA: Diagnosis not present

## 2016-02-15 DIAGNOSIS — I10 Essential (primary) hypertension: Secondary | ICD-10-CM | POA: Diagnosis not present

## 2016-02-15 DIAGNOSIS — R001 Bradycardia, unspecified: Secondary | ICD-10-CM

## 2016-02-15 NOTE — Addendum Note (Signed)
Addended by: Johnella MoloneyFUNDERBURK, Danaija Eskridge A on: 02/15/2016 10:26 AM   Modules accepted: Orders

## 2016-02-15 NOTE — Patient Instructions (Addendum)
BEFORE YOU LEAVE: -follow up: Physical in January - will plan to do labs then  Monitor pulse a few times per week and let use know how it is doing in 2 weeks.  We recommend the following healthy lifestyle: 1) Small portions - eat off of salad plate instead of dinner plate 2) Eat a healthy clean diet with avoidance of (less then 1 serving per week) processed foods, sweetened drinks, white starches, red meat, fast foods and sweets and consisting of: * 5-9 servings per day of fresh or frozen fruits and vegetables (not corn or potatoes, not dried or canned) *nuts and seeds, beans *olives and olive oil *small portions of lean meats such as fish and white chicken  *small portions of whole grains 3)Get at least 150 minutes of sweaty aerobic exercise per week 4)reduce stress - counseling, meditation, relaxation to balance other aspects of your life

## 2016-02-15 NOTE — Progress Notes (Signed)
HPI:   HLD/obesity: -meds: atorvastatin 40 2 days per week, fish oil -denies: leg cramps, cog changes -has cut back on alcohol and is not drinking at all, trying to stay away from sweets - walking 3 miles 3 days per week - has lost some wt and no longer in obese range -stable  HTN: -meds: bisoprolol-hctz - very small dose -has been on this BP med for 15 years and feels great on it despite lower HR -walks fast 3 miles most days without any symptoms -occ mild ankle edema -no CP, SOB, DOE  Seasonal alllergies: -meds: allegra, flonase - only takes these in the spring and fall for primarily nasal symptoms -denies: wheezing, hx astham  Adult acne: -uses retin A for this  -uses good sun protection -well controlled    ROS: See pertinent positives and negatives per HPI.  Past Medical History  Diagnosis Date  . Hyperlipidemia   . Migraine   . Heel spur   . Hypertension   . Allergy   . Arthritis     osteoarthritis  . Foot deformity     saw podiatrist remotely    Past Surgical History  Procedure Laterality Date  . No prior surgery      Family History  Problem Relation Age of Onset  . Heart disease Mother   . Stroke Mother   . Early death Father     accidental death - trauma  . Colon cancer Paternal Uncle 82  . Stomach cancer Neg Hx     Social History   Social History  . Marital Status: Married    Spouse Name: N/A  . Number of Children: N/A  . Years of Education: N/A   Occupational History  . teacher    Social History Main Topics  . Smoking status: Former Smoker    Quit date: 10/03/1981  . Smokeless tobacco: Never Used     Comment: smoked remotely for 10 years, quit in 1983  . Alcohol Use: Yes     Comment: 3 glasses about 4 days per week  . Drug Use: No  . Sexual Activity: Yes   Other Topics Concern  . 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  atorvastatin (LIPITOR) 40 MG tablet, TAKE 1 TABLET (40 MG TOTAL) BY MOUTH 2 (TWO) TIMES A WEEK., Disp: 30 tablet, Rfl: 2 .  bisoprolol-hydrochlorothiazide (ZIAC) 5-6.25 MG tablet, TAKE 1 TABLET BY MOUTH DAILY., Disp: 30 tablet, Rfl: 2 .  fexofenadine (ALLEGRA) 60 MG tablet, Take 60 mg by mouth daily., Disp: , Rfl:  .  fluticasone (FLONASE) 50 MCG/ACT nasal spray, SHAKE WELL BEFORE EACH USE AND INSTILL 1 TO 2 SPRAYS IN EACH NOSTRIL ONCE DAILY, Disp: 16 g, Rfl: 1 .  Probiotic Product (PROBIOTIC DAILY PO), Take by mouth., Disp: , Rfl:  .  tretinoin (RETIN-A) 0.025 % cream, APPLY ONCE DAILY TO FACE AS DIRECTED, Disp: 45 g, Rfl: 3  EXAM:  Filed Vitals:   02/15/16 0929  BP: 120/80  Pulse: 49  Temp: 98 F (36.7 C)    Body mass index is 28.99 kg/(m^2).  GENERAL: vitals reviewed and listed above, alert, oriented, appears well hydrated and in no acute distress  HEENT: atraumatic, conjunttiva clear, no obvious abnormalities on inspection of external nose and ears  NECK: no obvious masses on inspection  LUNGS: clear to auscultation bilaterally, no wheezes, rales or rhonchi, good air movement  CV: HRRR, no peripheral edema  MS: moves all extremities without noticeable abnormality  PSYCH: pleasant and cooperative, no obvious depression or anxiety  ASSESSMENT AND PLAN:  Discussed the following assessment and plan:  Essential hypertension  Hyperlipemia  Bradycardia  -hr runs a little low on ave, a little lower today - better on recheck -she feels fine and is avid exerciser and is asymptomatic -discussed weaning of bb, but she prefers to cont and monitor HR -CPE January -lifestyle recs -Patient advised to return or notify a doctor immediately if symptoms worsen or persist or new  concerns arise.  Patient Instructions  BEFORE YOU LEAVE: -follow up: Physical in January - will plan to do labs then  Monitor pulse a few times per week and let use know how it is doing in 2 weeks.  We recommend the following healthy lifestyle: 1) Small portions - eat off of salad plate instead of dinner plate 2) Eat a healthy clean diet with avoidance of (less then 1 serving per week) processed foods, sweetened drinks, white starches, red meat, fast foods and sweets and consisting of: * 5-9 servings per day of fresh or frozen fruits and vegetables (not corn or potatoes, not dried or canned) *nuts and seeds, beans *olives and olive oil *small portions of lean meats such as fish and white chicken  *small portions of whole grains 3)Get at least 150 minutes of sweaty aerobic exercise per week 4)reduce stress - counseling, meditation, relaxation to balance other aspects of your life      Kriste BasqueKIM, Lionel Woodberry R., DO

## 2016-02-15 NOTE — Progress Notes (Signed)
Pre visit review using our clinic review tool, if applicable. No additional management support is needed unless otherwise documented below in the visit note. 

## 2016-03-04 ENCOUNTER — Encounter: Payer: Self-pay | Admitting: Family Medicine

## 2016-03-11 ENCOUNTER — Encounter: Payer: Self-pay | Admitting: Family Medicine

## 2016-03-31 ENCOUNTER — Other Ambulatory Visit: Payer: Self-pay | Admitting: Family Medicine

## 2016-05-20 ENCOUNTER — Other Ambulatory Visit: Payer: Self-pay | Admitting: Family Medicine

## 2016-05-23 ENCOUNTER — Ambulatory Visit (INDEPENDENT_AMBULATORY_CARE_PROVIDER_SITE_OTHER): Payer: BC Managed Care – PPO | Admitting: *Deleted

## 2016-05-23 DIAGNOSIS — Z23 Encounter for immunization: Secondary | ICD-10-CM

## 2016-08-12 ENCOUNTER — Other Ambulatory Visit: Payer: Self-pay | Admitting: Family Medicine

## 2016-08-16 ENCOUNTER — Encounter: Payer: Self-pay | Admitting: Family Medicine

## 2016-08-16 LAB — HM MAMMOGRAPHY

## 2016-09-02 NOTE — Progress Notes (Deleted)
HPI:  Here for CPE:  -Concerns and/or follow up today:  Due for labs, PPSV23, bone density screening.  HLD/obesity: -meds: atorvastatin 40 2 days per week, fish oil  HTN: -meds: bisoprolol-hctz - very small dose  Seasonal alllergies: -meds: allegra, flonase - only takes these in the spring and fall for primarily nasal symptoms -denies: wheezing, hx astham  Adult acne: -uses retin A for this   OA of back:  -Diet: variety of foods, balance and well rounded, larger portion sizes  -Exercise: no regular exercise  -Taking folic acid, vitamin D or calcium: no  -Diabetes and Dyslipidemia Screening:  -Hx of HTN: no  -Vaccines: UTD  -pap history: 07/2014 normal w/ neg hpv  -sexual activity: yes, female partner, no new partners  -wants STI testing (Hep C if born 60-65): no  -FH breast, colon or ovarian ca: see FH Last mammogram: 08/2016 birads 1 Last colon cancer screening: 04/2013 with Dr. Juanda Chance - repeat in 10 years per review report  Breast Ca Risk Assessment: -[email or web address]  Genetic Counseling Screen: Http://www.breastcancergenesscreen.org/startScreen.aspx  FRAX (50-65):  DEXA (>/= 65):   -Alcohol, Tobacco, drug use: see social history  Review of Systems - no fevers, unintentional weight loss, vision loss, hearing loss, chest pain, sob, hemoptysis, melena, hematochezia, hematuria, genital discharge, changing or concerning skin lesions, bleeding, bruising, loc, thoughts of self harm or SI  Past Medical History:  Diagnosis Date  . Allergy   . Arthritis    osteoarthritis  . Foot deformity    saw podiatrist remotely  . Heel spur   . Hyperlipidemia   . Hypertension   . Migraine     Past Surgical History:  Procedure Laterality Date  . no prior surgery      Family History  Problem Relation Age of Onset  . Heart disease Mother   . Stroke Mother   . Early death Father     accidental death - trauma  . Colon cancer Paternal Uncle  5  . Stomach cancer Neg Hx     Social History   Social History  . Marital status: Married    Spouse name: N/A  . Number of children: N/A  . Years of education: N/A   Occupational History  . teacher    Social History Main Topics  . Smoking status: Former Smoker    Quit date: 10/03/1981  . Smokeless tobacco: Never Used     Comment: smoked remotely for 10 years, quit in 1983  . Alcohol use Yes     Comment: 3 glasses about 4 days per week  . Drug use: No  . Sexual activity: Yes   Other Topics Concern  . 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  atorvastatin (LIPITOR) 40 MG tablet, TAKE 1 TABLET (40 MG TOTAL) BY MOUTH 2 (TWO) TIMES A WEEK., Disp: 30 tablet, Rfl: 5 .  bisoprolol-hydrochlorothiazide (ZIAC) 5-6.25 MG tablet, TAKE 1 TABLET BY MOUTH DAILY., Disp: 30 tablet, Rfl: 5 .  fexofenadine (ALLEGRA) 60 MG tablet, Take 60 mg by mouth daily., Disp: , Rfl:  .  fluticasone (FLONASE)  50 MCG/ACT nasal spray, SHAKE WELL BEFORE EACH USE AND INSTILL 1 TO 2 SPRAYS IN EACH NOSTRIL ONCE DAILY, Disp: 16 g, Rfl: 3 .  Probiotic Product (PROBIOTIC DAILY PO), Take by mouth., Disp: , Rfl:  .  tretinoin (RETIN-A) 0.025 % cream, APPLY ONCE DAILY TO FACE AS DIRECTED, Disp: 45 g, Rfl: 3  EXAM:  There were no vitals filed for this visit.  GENERAL: vitals reviewed and listed below, alert, oriented, appears well hydrated and in no acute distress  HEENT: head atraumatic, PERRLA, normal appearance of eyes, ears, nose and mouth. moist mucus membranes.  NECK: supple, no masses or lymphadenopathy  LUNGS: clear to auscultation bilaterally, no rales, rhonchi or wheeze  CV: HRRR, no peripheral  edema or cyanosis, normal pedal pulses  ABDOMEN: bowel sounds normal, soft, non tender to palpation, no masses, no rebound or guarding  SKIN: no rash or abnormal lesions  MS: normal gait, moves all extremities normally  NEURO: normal gait, speech and thought processing grossly intact, muscle tone grossly intact throughout  PSYCH: normal affect, pleasant and cooperative  ASSESSMENT AND PLAN:  Discussed the following assessment and plan:  There are no diagnoses linked to this encounter.  -Discussed and advised all [place] preventive services health task force level A and B recommendations for age, sex and risks.  -Advised at least 150 minutes of exercise per week and a healthy diet with avoidance of (less then 1 serving per week) processed foods, white starches, red meat, fast foods and sweets and consisting of: * 5-9 servings of fresh fruits and vegetables (not corn or potatoes) *nuts and seeds, beans *olives and olive oil *lean meats such as fish and white chicken  *whole grains  -labs, studies and vaccines per orders this encounter  No orders of the defined types were placed in this encounter.   Patient advised to return to clinic immediately if symptoms worsen or persist or new concerns.  There are no Patient Instructions on file for this visit.  No Follow-up on file.  Kriste BasqueKIM, HANNAH R., DO

## 2016-09-03 ENCOUNTER — Encounter: Payer: Self-pay | Admitting: Family Medicine

## 2016-09-03 ENCOUNTER — Encounter: Payer: BC Managed Care – PPO | Admitting: Family Medicine

## 2016-09-03 ENCOUNTER — Telehealth: Payer: Self-pay | Admitting: Family Medicine

## 2016-09-03 NOTE — Telephone Encounter (Signed)
Patient called this morning stating that she called the office this morning around 7 am to let us know that she would not be in for appointment due to the snow. Patient states that she spoke with a guy this morning that would send over a message to let a us that she called cancelling appointment. Patient called and reschedule appointment to this thursday

## 2016-09-04 NOTE — Progress Notes (Signed)
Medicare Annual Preventive Care Visit  (initial annual wellness or annual wellness exam)  Concerns and/or follow up today:  PMH significant for HLD, Obesity, HTN, Seasonal allergies and acne. Her pulse has run lower for some time. Advised stopping bb in past but she did not want to. Now she wishes to try coming off of it. No CP, SOB, DOE or sig fatigue. Due for labs, Dexa, ppsv23. Mammo done 08/2016 birads 1 Pap done 07/2014 Colonoscopy done with Dr. Juanda ChanceBrodie 04/2013 and 10 yr repeat advised per review of report. Sees scanned documentation for detailed intake and health risks assessment.  ROS: negative for report of fevers, unintentional weight loss, vision changes, vision loss, hearing loss or change, chest pain, sob, hemoptysis, melena, hematochezia, hematuria, genital discharge or lesions, falls, bleeding or bruising, loc, thoughts of suicide or self harm, memory loss  1.) Patient-completed health risk assessment  - completed and reviewed, see scanned documentation  2.) Review of Medical History: -PMH, PSH, Family History and current specialty and care providers reviewed and updated and listed below  - see scanned in document in chart and below  Past Medical History:  Diagnosis Date  . Allergy   . Arthritis    osteoarthritis  . Foot deformity    saw podiatrist remotely  . Heel spur   . Hyperlipidemia   . Hypertension   . Migraine     Past Surgical History:  Procedure Laterality Date  . no prior surgery      Social History   Social History  . Marital status: Married    Spouse name: N/A  . Number of children: N/A  . Years of education: N/A   Occupational History  . teacher    Social History Main Topics  . Smoking status: Former Smoker    Quit date: 10/03/1981  . Smokeless tobacco: Never Used     Comment: smoked remotely for 10 years, quit in 1983  . Alcohol use Yes     Comment: 3 glasses about 4 days per week  . Drug use: No  . Sexual activity: Yes   Other Topics  Concern  . 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 Heart disease Mother   . Stroke Mother   . Early death Father     accidental death - trauma  . Colon cancer Paternal Uncle [id number]  . Stomach cancer Neg Hx     Current Outpatient Prescriptions on File Prior to Visit  Medication Sig Dispense Refill  . atorvastatin (LIPITOR) 40 MG tablet TAKE 1 TABLET (40 MG TOTAL) BY MOUTH 2 (TWO) TIMES A WEEK. 30 tablet 5  . fexofenadine (ALLEGRA) 60 MG tablet Take 60 mg by mouth daily.    . fluticasone (FLONASE) 50 MCG/ACT nasal spray SHAKE WELL BEFORE EACH USE AND INSTILL 1 TO 2 SPRAYS IN EACH NOSTRIL ONCE DAILY 16 g 3  . Probiotic Product (PROBIOTIC DAILY PO) Take by mouth.    . tretinoin (RETIN-A) 0.025 % cream APPLY ONCE DAILY TO FACE AS DIRECTED 45 g 3   No current facility-administered  medications on file prior to visit.      3.) Review of functional ability and level of safety:  Any difficulty hearing?  See scanned documentation  History of falling?  See scanned documentation  Any trouble with IADLs - using a phone, using transportation, grocery shopping, preparing meals, doing housework, doing laundry, taking medications and managing money?  See scanned documentation  Advance Directives?  Discussed briefly and offered more resources and detailed discussion with our trained staff.   See summary of recommendations in Patient Instructions below.  4.) Physical Exam Vitals:   09/05/16 0812  BP: 120/80  Pulse: (!) 51  Temp: 97.5 F (36.4 C)   Estimated body mass index is 30.04 kg/m as calculated from the following:   Height as of this encounter: 5\' 3"  (1.6 m).    Weight as of this encounter: 169 lb 9.6 oz (76.9 kg).  EKG (optional): deferred  General: alert, appear well hydrated and in no acute distress  HEENT: visual acuity grossly intact  CV: HRRR  Lungs: CTA bilaterally  Psych: pleasant and cooperative, no obvious depression or anxiety  Cognitive function grossly intact  See patient instructions for recommendations.  Education and counseling regarding the above review of health provided with a plan for the following: -see scanned patient completed form for further details -fall prevention strategies discussed  -healthy lifestyle discussed -importance and resources for completing advanced directives discussed -see patient instructions below for any other recommendations provided  4)The following written screening schedule of preventive measures were reviewed with assessment and plan made per below, orders and patient instructions:      Alcohol screening done     Obesity Screening and counseling done     STI screening (Hep C if born 29-65) offered and per pt wishes     Tobacco Screening done        Pneumococcal (PPSV23 -one dose after 64, one before if risk factors), influenza yearly and hepatitis B vaccines (if high risk - end stage renal disease, IV drugs, homosexual men, live in home for mentally retarded, hemophilia receiving factors) ASSESSMENT/PLAN: today      Screening mammograph (yearly if >40) ASSESSMENT/PLAN: utd       Screening Pap smear/pelvic exam (q2 years) ASSESSMENT/PLAN: utd       Colorectal cancer screening (FOBT yearly or flex sig q4y or colonoscopy q10y or barium enema q4y) ASSESSMENT/PLAN: utd       Diabetes outpatient self-management training services ASSESSMENT/PLAN: n/a      Bone mass measurements(covered q2y if indicated - estrogen def, osteoporosis, hyperparathyroid, vertebral abnormalities, osteoporosis or steroids) ASSESSMENT/PLAN: ordered      Screening for glaucoma(q1y if high risk -  diabetes, FH, AA and > 50 or hispanic and > 65) ASSESSMENT/PLAN: utd       Cardiovascular screening blood tests (lipids q5y) ASSESSMENT/PLAN: see orders and labs      Diabetes screening tests ASSESSMENT/PLAN: see orders and labs   7.) Summary:     Welcome to Medicare preventive visit - Plan: Hemoglobin A1c  Essential hypertension - Plan: Basic metabolic panel, CBC (no diff)  Hyperlipidemia, unspecified hyperlipidemia type - Plan: Lipid Panel  Estrogen deficiency - Plan: DG Bone Density  Need for prophylactic vaccination against Streptococcus pneumoniae (pneumococcus) - Plan: Pneumococcal polysaccharide vaccine 23-valent greater than or equal to 2yo subcutaneous/IM  -risk factors and conditions per above assessment were discussed and treatment, recommendations and referrals were offered per documentation above and orders and patient instructions. -dexa ordered -pneumococcal today -wean off  BB -increased hctz to compensate for decreasing BB and will follow up in 1 month -lifestyle recs -labs per orders  Patient Instructions  BEFORE YOU LEAVE: -pneumococcal 23 -labs -follow up: in 1 month for blood pressure with Dr. Selena Batten  Stop the combination blood pressure medication.  Start hydrochlorothiazide 12.5 mg daily.  Wean off of the Bisoprolol - 2.5 mg (1/2 tablet) daily for 2 weeks, then 2.5 mg every other day for 2 weeks, then stop.  -We placed a referral for you as discussed for the bone density test. It usually takes about 1-2 weeks to process and schedule this referral. If you have not heard from [place] regarding this appointment in 2 weeks please contact our office.  We have ordered labs or studies at this visit. It can take up to 1-2 weeks for results and processing. IF results require follow up or explanation, we will call you with instructions. Clinically stable results will be released to your [facility]. If you have not heard from [place] or cannot find your results in [facility] in  2 weeks please contact our office at [phone].  If you are not yet signed up for [facility], please consider signing up.   We recommend the following healthy lifestyle for LIFE: 1) Small portions.   Tip: eat off of a salad plate instead of a dinner plate.  Tip: if you need more or a snack choose fruits, veggies and/or a handful of nuts or seeds.  2) Eat a healthy clean diet.  * Tip: Avoid (less then 1 serving per week): processed foods, sweets, sweetened drinks, white starches (rice, flour, bread, potatoes, pasta, etc), red meat, fast foods, butter  *Tip: CHOOSE instead   * 5-9 servings per day of fresh or frozen fruits and vegetables (but not corn, potatoes, bananas, canned or dried fruit)   *nuts and seeds, beans   *olives and olive oil   *small portions of lean meats such as fish and white chicken    *small portions of whole grains  3)Get at least 150 minutes of sweaty aerobic exercise per week.  4)Reduce stress - consider counseling, meditation and relaxation to balance other aspects of your life.             Kriste Basque R., DO

## 2016-09-05 ENCOUNTER — Encounter: Payer: Self-pay | Admitting: Family Medicine

## 2016-09-05 ENCOUNTER — Ambulatory Visit (INDEPENDENT_AMBULATORY_CARE_PROVIDER_SITE_OTHER): Payer: Medicare Other | Admitting: Family Medicine

## 2016-09-05 VITALS — BP 120/80 | HR 51 | Temp 97.5°F | Ht 63.0 in | Wt 169.6 lb

## 2016-09-05 DIAGNOSIS — E785 Hyperlipidemia, unspecified: Secondary | ICD-10-CM

## 2016-09-05 DIAGNOSIS — Z23 Encounter for immunization: Secondary | ICD-10-CM | POA: Diagnosis not present

## 2016-09-05 DIAGNOSIS — Z0000 Encounter for general adult medical examination without abnormal findings: Secondary | ICD-10-CM | POA: Diagnosis not present

## 2016-09-05 DIAGNOSIS — I10 Essential (primary) hypertension: Secondary | ICD-10-CM

## 2016-09-05 DIAGNOSIS — E2839 Other primary ovarian failure: Secondary | ICD-10-CM | POA: Diagnosis not present

## 2016-09-05 LAB — BASIC METABOLIC PANEL
BUN: 15 mg/dL (ref 6–23)
CALCIUM: 9.5 mg/dL (ref 8.4–10.5)
CHLORIDE: 103 meq/L (ref 96–112)
CO2: 32 meq/L (ref 19–32)
Creatinine, Ser: 0.74 mg/dL (ref 0.40–1.20)
GFR: 83.66 mL/min (ref >60.00)
GLUCOSE: 82 mg/dL (ref 70–99)
POTASSIUM: 3.8 meq/L (ref 3.5–5.1)
SODIUM: 139 meq/L (ref 135–145)

## 2016-09-05 LAB — HEMOGLOBIN A1C: HEMOGLOBIN A1C: 5.1 % (ref 4.6–6.5)

## 2016-09-05 LAB — CBC
HEMATOCRIT: 41.1 % (ref 36.0–46.0)
HEMOGLOBIN: 14.1 g/dL (ref 12.0–15.0)
MCHC: 34.3 g/dL (ref 30.0–36.0)
MCV: 89.7 fl (ref 78.0–100.0)
PLATELETS: 192 10*3/uL (ref 150.0–400.0)
RBC: 4.58 Mil/uL (ref 3.87–5.11)
RDW: 13.1 % (ref 11.5–15.5)
WBC: 4.5 10*3/uL (ref 4.0–10.5)

## 2016-09-05 LAB — LIPID PANEL
CHOLESTEROL: 196 mg/dL (ref 0–200)
HDL: 66 mg/dL (ref >39.00)
LDL Cholesterol: 110 mg/dL — ABNORMAL HIGH (ref 0–99)
NonHDL: 130.09
TRIGLYCERIDES: 98 mg/dL (ref 0.0–149.0)
Total CHOL/HDL Ratio: 3
VLDL: 19.6 mg/dL (ref 0.0–40.0)

## 2016-09-05 MED ORDER — BISOPROLOL FUMARATE 5 MG PO TABS: ORAL_TABLET | ORAL | 0 refills | Status: DC

## 2016-09-05 MED ORDER — HYDROCHLOROTHIAZIDE 12.5 MG PO CAPS: 12.5000 mg | ORAL_CAPSULE | Freq: Every day | ORAL | 3 refills | Status: DC

## 2016-09-05 NOTE — Progress Notes (Signed)
Pre visit review using our clinic review tool, if applicable. No additional management support is needed unless otherwise documented below in the visit note. 

## 2016-09-05 NOTE — Patient Instructions (Signed)
BEFORE YOU LEAVE: -pneumococcal 23 -labs -follow up: in 1 month for blood pressure with Dr. Selena BattenKim  Stop the combination blood pressure medication.  Start hydrochlorothiazide 12.5 mg daily.  Wean off of the Bisoprolol - 2.5 mg (1/2 tablet) daily for 2 weeks, then 2.5 mg every other day for 2 weeks, then stop.  -We placed a referral for you as discussed for the bone density test. It usually takes about 1-2 weeks to process and schedule this referral. If you have not heard from us regarding this appointment in 2 weeks please contact our office.  We have ordered labs or studies at this visit. It can take up to 1-2 weeks for results and processing. IF results require follow up or explanation, we will call you with instructions. Clinically stable results will be [facility] to [facility]. If you have not heard from us or cannot find your results in [facility] in 2 weeks please contact our office at [phone].  If you are not yet [facility] for [facility], please consider signing up.   We recommend the following healthy lifestyle for LIFE: 1) Small portions.   Tip: eat off of a salad plate instead of a dinner plate.  Tip: if you need more or a snack choose fruits, veggies and/or a handful of nuts or seeds.  2) Eat a healthy clean diet.  * Tip: Avoid (less then 1 serving per week): processed foods, sweets, sweetened drinks, white starches (rice, flour, bread, potatoes, pasta, etc), red meat, fast foods, butter  *Tip: CHOOSE instead   * 5-9 servings per day of fresh or frozen fruits and vegetables (but not corn, potatoes, bananas, canned or dried fruit)   *nuts and seeds, beans   *olives and olive oil   *small portions of lean meats such as fish and white chicken    *small portions of whole grains  3)Get at least 150 minutes of sweaty aerobic exercise per week.  4)Reduce stress - consider counseling, meditation and relaxation to balance other aspects of your life.

## 2016-09-13 ENCOUNTER — Encounter: Payer: Self-pay | Admitting: Family Medicine

## 2016-09-13 ENCOUNTER — Ambulatory Visit (INDEPENDENT_AMBULATORY_CARE_PROVIDER_SITE_OTHER): Payer: Medicare Other | Admitting: Family Medicine

## 2016-09-13 VITALS — BP 138/80 | HR 74 | Temp 99.0°F | Ht 63.0 in | Wt 164.2 lb

## 2016-09-13 DIAGNOSIS — J069 Acute upper respiratory infection, unspecified: Secondary | ICD-10-CM

## 2016-09-13 DIAGNOSIS — J3089 Other allergic rhinitis: Secondary | ICD-10-CM | POA: Diagnosis not present

## 2016-09-13 DIAGNOSIS — B9789 Other viral agents as the cause of diseases classified elsewhere: Secondary | ICD-10-CM

## 2016-09-13 MED ORDER — BENZONATATE 100 MG PO CAPS: 100.0000 mg | ORAL_CAPSULE | Freq: Two times a day (BID) | ORAL | 0 refills | Status: DC | PRN

## 2016-09-13 NOTE — Progress Notes (Signed)
Pre visit review using our clinic review tool, if applicable. No additional management support is needed unless otherwise documented below in the visit note. 

## 2016-09-13 NOTE — Progress Notes (Signed)
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  Past Medical History:  Diagnosis Date  . Allergy   . Arthritis    osteoarthritis  . Foot deformity    saw podiatrist remotely  . Heel spur   . Hyperlipidemia   . Hypertension   . Migraine     Past Surgical History:  Procedure Laterality Date  . no prior surgery      Family History  Problem Relation Age of Onset  . Heart disease Mother   . Stroke Mother   . Early death Father     accidental death - trauma  . Colon cancer Paternal Uncle 53  . Stomach cancer Neg Hx     Social History   Social History  . Marital status: Married    Spouse name: N/A  . Number of children: N/A  . Years of education: N/A   Occupational History  . teacher    Social History Main Topics  . Smoking status: Former Smoker    Quit date: 10/03/1981  . Smokeless tobacco: Never Used     Comment: smoked remotely for 10 years, quit in 1983  . Alcohol use Yes     Comment: 3 glasses about 4 days per week  . Drug use: No  . Sexual activity: Yes   Other Topics Concern  . 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  atorvastatin (LIPITOR) 40 MG tablet, TAKE 1 TABLET (40 MG TOTAL) BY MOUTH 2 (TWO) TIMES A WEEK., Disp: 30 tablet, Rfl: 5 .  bisoprolol (ZEBETA) 5 MG tablet, 2.5 mg daily for 2 weeks, then  2.5mg  every other day for 2 weeks, then stop., Disp: 21 tablet, Rfl: 0 .  fexofenadine (ALLEGRA) 60 MG tablet, Take 60 mg by mouth daily., Disp: , Rfl:  .  fluticasone (FLONASE) 50 MCG/ACT nasal spray, SHAKE WELL BEFORE EACH USE AND INSTILL 1 TO 2 SPRAYS IN EACH NOSTRIL ONCE DAILY, Disp: 16 g, Rfl: 3 .  hydrochlorothiazide (MICROZIDE) 12.5 MG capsule, Take 1 capsule (12.5 mg total) by mouth daily., Disp: 90 capsule, Rfl: 3 .  Probiotic Product (PROBIOTIC DAILY PO), Take by mouth., Disp: , Rfl:  .  tretinoin (RETIN-A) 0.025 % cream, APPLY ONCE DAILY TO FACE AS DIRECTED, Disp: 45 g, Rfl: 3 .  benzonatate (TESSALON) 100 MG capsule, Take 1 capsule (100 mg total) by mouth 2 (two) times daily as needed for cough., Disp: 20 capsule, Rfl: 0  EXAM:  Vitals:   09/13/16 1050  BP: 138/80  Pulse: 74  Temp: 99 F (37.2 C)    Body mass index is 29.09 kg/m.  GENERAL: vitals reviewed and listed above, alert, oriented, appears well hydrated and in no acute distress  HEENT: atraumatic, conjunttiva clear, no obvious abnormalities on  inspection of external nose and ears, normal appearance of ear canals and TMs, clear nasal congestion, mild post oropharyngeal erythema with PND, no tonsillar edema or exudate, no sinus TTP  NECK: no obvious masses on inspection  LUNGS: clear to auscultation bilaterally, no wheezes, rales or rhonchi, good air movement  CV: HRRR, no peripheral edema  MS: moves all extremities without noticeable abnormality  PSYCH: pleasant and cooperative, no obvious depression or anxiety  ASSESSMENT AND PLAN:  Discussed the following assessment and plan:  Viral upper respiratory tract infection  Non-seasonal allergic rhinitis, unspecified chronicity, unspecified trigger  -given HPI and exam findings today, a serious infection or illness is unlikely. We discussed potential etiologies, with VURI being most likely, and advised supportive care and monitoring. Tessalon for cough. We  discussed treatment side effects, likely course, antibiotic misuse, transmission, and signs of developing a serious illness. -of course, we advised to return or notify a doctor immediately if symptoms worsen or persist or new concerns arise. Declined AVS.   There are no Patient Instructions on file for this visit.  Kriste BasqueKIM, Ruqayya Ventress R., DO

## 2016-09-17 ENCOUNTER — Telehealth: Payer: Self-pay | Admitting: Family Medicine

## 2016-09-17 MED ORDER — HYDROCODONE-HOMATROPINE 5-1.5 MG/5ML PO SYRP: 5.0000 mL | ORAL_SOLUTION | Freq: Two times a day (BID) | ORAL | 0 refills | Status: DC | PRN

## 2016-09-17 NOTE — Telephone Encounter (Signed)
We did send tessalon at her appt. If not working could try hycodan 5mL bid prn, - stronger cough syrup with narcotic - advise she discuss risks with pharmacist. If worsening/wheezing/sob advise eval... Thanks!

## 2016-09-17 NOTE — Telephone Encounter (Signed)
I called the pt and informed her of the message below and she is aware the Rx was left at the front desk for her husband to pick up.

## 2016-09-17 NOTE — Telephone Encounter (Signed)
Pt would like to see if you would send her something for he cough poss bronchitis.   Pharm:  [facility] [employer] [employer].

## 2016-10-02 NOTE — Progress Notes (Signed)
HPI:  Follow up HTN: -weaned off BB per her wishes at last visit as had lower HR, took yesterday 1/2 tablet, feels she tolerates the 1/2 tablet well -She now remembers that she has had palpitations in the past, she has had a return in some of her palpitations since weaning down on the medication. She mainly notices them in the evenings when she gets home from work. She has had a lot more stress at work. -denies: Chest pain, shortness of breath, dyspnea on exertion, orthopnea, dizziness, headaches, vision changes, increased swelling -Her cough is now much better, does have allergies, occasionally when she coughs she feels a mild shortness of breath when she is coughing only  ROS: See pertinent positives and negatives per HPI.  Past Medical History:  Diagnosis Date  . Allergy   . Arthritis    osteoarthritis  . Foot deformity    saw podiatrist remotely  . Heel spur   . Hyperlipidemia   . Hypertension   . Migraine     Past Surgical History:  Procedure Laterality Date  . no prior surgery      Family History  Problem Relation Age of Onset  . Heart disease Mother   . Stroke Mother   . Early death Father     accidental death - trauma  . Colon cancer Paternal Uncle [id number]  . Stomach cancer Neg Hx     Social History   Social History  . Marital status: Married    Spouse name: N/A  . Number of children: N/A  . Years of education: N/A   Occupational History  . teacher    Social History Main Topics  . Smoking status: Former Smoker    Quit date: 10/03/1981  . Smokeless tobacco: Never Used     Comment: smoked remotely for 10 years, quit in 1983  . Alcohol use Yes     Comment: 3 glasses about 4 days per week  . Drug use: No  . Sexual activity: Yes   Other Topics Concern  . 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  atorvastatin (LIPITOR) 40 MG tablet, TAKE 1 TABLET (40 MG TOTAL) BY MOUTH 2 (TWO) TIMES A WEEK., Disp: 30 tablet, Rfl: 5 .  bisoprolol (ZEBETA) 5 MG tablet, 2.5 mg daily for 2 weeks, then 2.5mg  every other day for 2 weeks, then stop., Disp: 21 tablet, Rfl: 0 .  fexofenadine (ALLEGRA) 60 MG tablet, Take 60 mg by mouth daily., Disp: , Rfl:  .  fluticasone (FLONASE) 50 MCG/ACT nasal spray, SHAKE WELL BEFORE EACH USE AND INSTILL 1 TO 2 SPRAYS IN EACH NOSTRIL ONCE DAILY, Disp: 16 g, Rfl: 3 .  hydrochlorothiazide (MICROZIDE) 12.5 MG capsule, Take 1 capsule (12.5 mg total) by mouth daily., Disp: 90 capsule, Rfl: 3 .  Probiotic Product (PROBIOTIC DAILY PO), Take by mouth., Disp: , Rfl:  .  tretinoin (RETIN-A) 0.025 % cream, APPLY ONCE DAILY TO FACE AS DIRECTED, Disp: 45 g, Rfl: 3  EXAM:  Vitals:   10/03/16 0921  BP: 126/70  Pulse: 67  Temp: 98.4 F (36.9  C)    Body mass index is 28.77 kg/m.  GENERAL: vitals reviewed and listed above, alert, oriented, appears well hydrated and in no acute distress  HEENT: atraumatic, conjunttiva clear, no obvious abnormalities on inspection of external nose and ears  NECK: no obvious masses on inspection  LUNGS: clear to auscultation bilaterally, no wheezes, rales or rhonchi, good air movement  CV: HRRR, no peripheral edema  MS: moves all extremities without noticeable abnormality  PSYCH: pleasant and cooperative, no obvious depression or anxiety  ASSESSMENT AND PLAN:  Discussed the following assessment and plan:  Palpitations - Plan: EKG 12-Lead, TSH  Essential hypertension  -EKG with mild bradycardia, OW mild nonspecific TW abnormalities seen on prior -will check TSH -Opted to continue a low dose of the beta blocker, as it seems that perhaps this was started for palpitations which have returned with  her weaning off of this and she wishes to continue -She is to update [place] on her symptoms in a week and if persistent palpitations or get a Holter monitor and echo -Patient advised to return or notify a doctor immediately in the interim if symptoms worsen or  or new concerns arise.  Patient Instructions  BEFORE YOU LEAVE: -EKG -follow up: 3 months  Please continue the bisoprolol 2.5 mg daily. Continue the hydrochlorothiazide as well.  Please seek care immediately if you have worsening palpitations, chest pain or trouble breathing.  Otherwise, please call in 1 week if you're continuing to have palpitations.      Kriste Basque R., DO

## 2016-10-03 ENCOUNTER — Ambulatory Visit (INDEPENDENT_AMBULATORY_CARE_PROVIDER_SITE_OTHER): Payer: Medicare Other | Admitting: Family Medicine

## 2016-10-03 ENCOUNTER — Encounter: Payer: Self-pay | Admitting: Family Medicine

## 2016-10-03 VITALS — BP 126/70 | HR 67 | Temp 98.4°F | Ht 63.0 in | Wt 162.4 lb

## 2016-10-03 DIAGNOSIS — R002 Palpitations: Secondary | ICD-10-CM

## 2016-10-03 DIAGNOSIS — I10 Essential (primary) hypertension: Secondary | ICD-10-CM

## 2016-10-03 LAB — TSH: TSH: 1.77 u[IU]/mL (ref 0.35–4.50)

## 2016-10-03 MED ORDER — BISOPROLOL FUMARATE 5 MG PO TABS: ORAL_TABLET | ORAL | 3 refills | Status: DC

## 2016-10-03 NOTE — Progress Notes (Signed)
Pre visit review using our clinic review tool, if applicable. No additional management support is needed unless otherwise documented below in the visit note. 

## 2016-10-03 NOTE — Patient Instructions (Signed)
BEFORE YOU LEAVE: -EKG -follow up: 3 months  Please continue the bisoprolol 2.5 mg daily. Continue the hydrochlorothiazide as well.  Please seek care immediately if you have worsening palpitations, chest pain or trouble breathing.  Otherwise, please call in 1 week if you're continuing to have palpitations.

## 2016-10-10 ENCOUNTER — Ambulatory Visit (INDEPENDENT_AMBULATORY_CARE_PROVIDER_SITE_OTHER)
Admission: RE | Admit: 2016-10-10 | Discharge: 2016-10-10 | Disposition: A | Discharge status: Home / Self Care | Payer: Medicare Other | Source: Ambulatory Visit | Attending: Family Medicine | Admitting: Family Medicine

## 2016-10-10 DIAGNOSIS — E2839 Other primary ovarian failure: Secondary | ICD-10-CM | POA: Diagnosis not present

## 2017-01-03 NOTE — Progress Notes (Signed)
HPI:   Michaela [name] is a very pleasant 66 y.o. here for follow up. Chronic medical problems summarized below were reviewed for changes and stability and were updated as needed below. She still has questions regarding her bisoprolol. She is now tolerating the 2.5mg  without palpitations. When tried to taper in the past she felt heart was racing. We were trying to taper off as she tend to have a low HR, poor exercise tolerance and occ dizziness - thought she does admit this has been the case as long as she can remember.See notes below. Denies CP, SOB, DOE, treatment intolerance or new symptoms.  Due for BP labs. AWV 09/2016  HTN/Hx Palpitations: -meds: bisoprolol, hctz -doing ok on the 2.5 of the bisoprolol without palpitations, does have some fatigue with exercise -no CP, SOB, DOE, swelling -wonders about tapering bb further  Obesity/HLD: -meds: atorvastatin  Seasonal allergies: -meds: flonase, allegra  ROS: See pertinent positives and negatives per HPI.  Past Medical History:  Diagnosis Date  . Allergy   . Arthritis    osteoarthritis  . Foot deformity    saw podiatrist remotely  . Heel spur   . Hyperlipidemia   . Hypertension   . Migraine     Past Surgical History:  Procedure Laterality Date  . no prior surgery      Family History  Problem Relation Age of Onset  . Heart disease Mother   . Stroke Mother   . Early death Father        accidental death - trauma  . Colon cancer Paternal Uncle 78  . Stomach cancer Neg Hx     Social History   Social History  . Marital status: Married    Spouse name: N/A  . Number of children: N/A  . Years of education: N/A   Occupational History  . teacher    Social History Main Topics  . Smoking status: Former Smoker    Quit date: 10/03/1981  . Smokeless tobacco: Never Used     Comment: smoked remotely for 10 years, quit in 1983  . Alcohol use Yes     Comment: 3 glasses about 4 days per week  . Drug use: No  . Sexual  activity: Yes   Other Topics Concern  . 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  atorvastatin (LIPITOR) 40 MG tablet, TAKE 1 TABLET (40 MG TOTAL) BY MOUTH 2 (TWO) TIMES A WEEK., Disp: 30 tablet, Rfl: 5 .  bisoprolol (ZEBETA) 5 MG tablet, Take 0.5 tablets (2.5 mg total) by mouth daily. 2.5 mg daily., Disp: 30 tablet, Rfl: 3 .  fexofenadine (ALLEGRA) 60 MG tablet, Take 60 mg by mouth daily., Disp: , Rfl:  .  fluticasone (FLONASE) 50 MCG/ACT nasal spray, SHAKE WELL BEFORE EACH USE AND INSTILL 1 TO 2 SPRAYS IN EACH NOSTRIL ONCE DAILY, Disp: 16 g, Rfl: 3 .  hydrochlorothiazide (MICROZIDE) 12.5 MG capsule, Take 1 capsule (12.5 mg total) by mouth daily., Disp: 90 capsule, Rfl: 3 .  Probiotic Product (PROBIOTIC DAILY PO), Take by mouth., Disp: , Rfl:  .  tretinoin (RETIN-A) 0.025 % cream, APPLY ONCE DAILY TO FACE AS DIRECTED, Disp: 45 g, Rfl: 3  EXAM:  Vitals:   01/06/17 0903  BP: 136/82  Pulse: (!) 51  Temp: 98.1 F (36.7 C)    Body mass index is 29.1 kg/m.  GENERAL: vitals reviewed and listed above, alert, oriented, appears well hydrated and in no acute distress  HEENT: atraumatic, conjunttiva clear, no obvious abnormalities on inspection of external nose and ears  NECK: no obvious masses on inspection  LUNGS: clear to auscultation bilaterally, no wheezes, rales or rhonchi, good air movement  CV: HRRR, no peripheral edema  MS: moves all extremities without noticeable abnormality  PSYCH: pleasant and cooperative, no obvious depression or anxiety  ASSESSMENT AND PLAN:  Discussed the following assessment and plan:  Essential hypertension - Plan: Basic metabolic panel,  CBC  Hyperlipidemia, unspecified hyperlipidemia type  Bradycardia  -opted to try to taper the bb a little further to the lowest possible dose -may need additional antihypertensive -she plans to monitor HR if feels heart racing -close follow up to recheck -Patient advised to return or notify a doctor immediately if symptoms worsen or persist or new concerns arise.  Patient Instructions  Follow up 1 month Labs  Can try 1/4 tablet of the bisoprolol. We may need to increase your diuretic to compensate - will check blood pressure at next visit.  Check heart rate (pulse) if any palpitations and write down.    Kriste BasqueKIM, Derward Marple R., DO

## 2017-01-06 ENCOUNTER — Ambulatory Visit (INDEPENDENT_AMBULATORY_CARE_PROVIDER_SITE_OTHER): Payer: Medicare Other | Admitting: Family Medicine

## 2017-01-06 ENCOUNTER — Encounter: Payer: Self-pay | Admitting: Family Medicine

## 2017-01-06 VITALS — BP 136/82 | HR 51 | Temp 98.1°F | Ht 63.0 in | Wt 164.3 lb

## 2017-01-06 DIAGNOSIS — I10 Essential (primary) hypertension: Secondary | ICD-10-CM | POA: Diagnosis not present

## 2017-01-06 DIAGNOSIS — R001 Bradycardia, unspecified: Secondary | ICD-10-CM | POA: Diagnosis not present

## 2017-01-06 DIAGNOSIS — E785 Hyperlipidemia, unspecified: Secondary | ICD-10-CM

## 2017-01-06 LAB — BASIC METABOLIC PANEL
BUN: 19 mg/dL (ref 6–23)
CHLORIDE: 101 meq/L (ref 96–112)
CO2: 30 meq/L (ref 19–32)
CREATININE: 0.74 mg/dL (ref 0.40–1.20)
Calcium: 9.4 mg/dL (ref 8.4–10.5)
GFR: 83.57 mL/min (ref >60.00)
Glucose, Bld: 85 mg/dL (ref 70–99)
POTASSIUM: 3.5 meq/L (ref 3.5–5.1)
SODIUM: 139 meq/L (ref 135–145)

## 2017-01-06 LAB — CBC
HEMATOCRIT: 42.6 % (ref 36.0–46.0)
Hemoglobin: 14.3 g/dL (ref 12.0–15.0)
MCHC: 33.6 g/dL (ref 30.0–36.0)
MCV: 88.8 fl (ref 78.0–100.0)
PLATELETS: 194 10*3/uL (ref 150.0–400.0)
RBC: 4.8 Mil/uL (ref 3.87–5.11)
RDW: 13.3 % (ref 11.5–15.5)
WBC: 4.7 10*3/uL (ref 4.0–10.5)

## 2017-01-06 MED ORDER — BISOPROLOL FUMARATE 5 MG PO TABS: 1.2500 mg | ORAL_TABLET | Freq: Every day | ORAL | 3 refills | Status: DC

## 2017-01-06 NOTE — Patient Instructions (Signed)
Follow up 1 month Labs  Can try 1/4 tablet of the bisoprolol. We may need to increase your diuretic to compensate - will check blood pressure at next visit.  Check heart rate (pulse) if any palpitations and write down.

## 2017-02-02 NOTE — Progress Notes (Signed)
HPI:  HTN/hx palpitations: -on diuretic and BB - has had low HR and poor exercise tolerace, so she has been trying to wean off the BB which she is reluctant to do -reports: doing well -denies: CP, SOB, palpitations, increased dizziness -getting more activity, less stress, eating healthy  ROS: See pertinent positives and negatives per HPI.  Past Medical History:  Diagnosis Date  . Allergy   . Arthritis    osteoarthritis  . Foot deformity    saw podiatrist remotely  . Heel spur   . Hyperlipidemia   . Hypertension   . Migraine     Past Surgical History:  Procedure Laterality Date  . no prior surgery      Family History  Problem Relation Age of Onset  . Heart disease Mother   . Stroke Mother   . Early death Father        accidental death - trauma  . Colon cancer Paternal Uncle [id number]  . Stomach cancer Neg Hx     Social History   Social History  . Marital status: Married    Spouse name: N/A  . Number of children: N/A  . Years of education: N/A   Occupational History  . teacher    Social History Main Topics  . Smoking status: Former Smoker    Quit date: 10/03/1981  . Smokeless tobacco: Never Used     Comment: smoked remotely for 10 years, quit in 1983  . Alcohol use Yes     Comment: 3 glasses about 4 days per week  . Drug use: No  . Sexual activity: Yes   Other Topics Concern  . 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  atorvastatin (LIPITOR) 40 MG tablet, TAKE 1 TABLET (40 MG TOTAL) BY MOUTH 2 (TWO) TIMES A WEEK., Disp: 30 tablet, Rfl: 5 .  fexofenadine (ALLEGRA) 60 MG tablet, Take 60 mg by mouth daily., Disp: , Rfl:  .  fluticasone (FLONASE) 50 MCG/ACT  nasal spray, SHAKE WELL BEFORE EACH USE AND INSTILL 1 TO 2 SPRAYS IN EACH NOSTRIL ONCE DAILY, Disp: 16 g, Rfl: 3 .  hydrochlorothiazide (MICROZIDE) 12.5 MG capsule, Take 1 capsule (12.5 mg total) by mouth daily., Disp: 90 capsule, Rfl: 3 .  Probiotic Product (PROBIOTIC DAILY PO), Take by mouth., Disp: , Rfl:  .  tretinoin (RETIN-A) 0.025 % cream, APPLY ONCE DAILY TO FACE AS DIRECTED, Disp: 45 g, Rfl: 3  EXAM:  Vitals:   02/03/17 0925  BP: 102/60  Pulse: (!) 53  Temp: 98.3 F (36.8 C)    Body mass index is 28.71 kg/m.  GENERAL: vitals reviewed and listed above, alert, oriented, appears well hydrated and in no acute distress  HEENT: atraumatic, conjunttiva clear, no obvious abnormalities on inspection of external nose and ears  NECK: no obvious masses on inspection  LUNGS: clear to auscultation bilaterally, no wheezes, rales or rhonchi, good air movement  CV: mild brady, no peripheral edema  MS: moves all extremities without noticeable abnormality  PSYCH: pleasant and cooperative, no obvious depression or anxiety  ASSESSMENT AND PLAN:  Discussed  the following assessment and plan:  Essential hypertension  Hyperlipidemia, unspecified hyperlipidemia type  -lifestyle recs -stop very low dose bisoprolol - monitor BP  -follow up 4 months -Patient advised to return or notify a doctor immediately if symptoms worsen or persist or new concerns arise.  Patient Instructions  BEFORE YOU LEAVE: -follow up: 4 months  Stop the bisoprolol.   We recommend the following healthy lifestyle for LIFE: 1) Small portions.   Tip: eat off of a salad plate instead of a dinner plate.  Tip: It is ok to feel hungry after a meal - that likely means you ate an appropriate portion.  Tip: if you need more or a snack choose fruits, veggies and/or a handful of nuts or seeds.  2) Eat a healthy clean diet.   TRY TO EAT: -at least 5-7 servings of low sugar vegetables per day (not corn, potatoes or  bananas.) -berries are the best choice if you wish to eat fruit.   -lean meets (fish, chicken or [place] breasts) -vegan proteins for some meals - beans or tofu, whole grains, nuts and seeds -Replace bad fats with good fats - good fats include: fish, nuts and seeds, canola oil, olive oil -small amounts of low fat or non fat dairy -small amounts of100 % whole grains - check the lables  AVOID: -SUGAR, sweets, anything with added sugar, corn syrup or sweeteners -if you must have a sweetener, small amounts of stevia may be best -sweetened beverages -simple starches (rice, bread, potatoes, pasta, chips, etc - small amounts of 100% whole grains are ok) -red meat, pork, butter -fried foods, fast food, processed food, excessive dairy, eggs and coconut.  3)Get at least 150 minutes of sweaty aerobic exercise per week.  4)Reduce stress - consider counseling, meditation and relaxation to balance other aspects of your life.     Kriste Basque R., DO

## 2017-02-03 ENCOUNTER — Ambulatory Visit (INDEPENDENT_AMBULATORY_CARE_PROVIDER_SITE_OTHER): Payer: Medicare Other | Admitting: Family Medicine

## 2017-02-03 ENCOUNTER — Encounter: Payer: Self-pay | Admitting: Family Medicine

## 2017-02-03 VITALS — BP 102/60 | HR 53 | Temp 98.3°F | Ht 63.0 in | Wt 162.1 lb

## 2017-02-03 DIAGNOSIS — I10 Essential (primary) hypertension: Secondary | ICD-10-CM

## 2017-02-03 DIAGNOSIS — E785 Hyperlipidemia, unspecified: Secondary | ICD-10-CM | POA: Diagnosis not present

## 2017-02-03 NOTE — Patient Instructions (Signed)
BEFORE YOU LEAVE: -follow up: 4 months  Stop the bisoprolol.   We recommend the following healthy lifestyle for LIFE: 1) Small portions.   Tip: eat off of a salad plate instead of a dinner plate.  Tip: It is ok to feel hungry after a meal - that likely means you ate an appropriate portion.  Tip: if you need more or a snack choose fruits, veggies and/or a handful of nuts or seeds.  2) Eat a healthy clean diet.   TRY TO EAT: -at least 5-7 servings of low sugar vegetables per day (not corn, potatoes or bananas.) -berries are the best choice if you wish to eat fruit.   -lean meets (fish, chicken or [place] breasts) -vegan proteins for some meals - beans or tofu, whole grains, nuts and seeds -Replace bad fats with good fats - good fats include: fish, nuts and seeds, canola oil, olive oil -small amounts of low fat or non fat dairy -small amounts of100 % whole grains - check the lables  AVOID: -SUGAR, sweets, anything with added sugar, corn syrup or sweeteners -if you must have a sweetener, small amounts of stevia may be best -sweetened beverages -simple starches (rice, bread, potatoes, pasta, chips, etc - small amounts of 100% whole grains are ok) -red meat, pork, butter -fried foods, fast food, processed food, excessive dairy, eggs and coconut.  3)Get at least 150 minutes of sweaty aerobic exercise per week.  4)Reduce stress - consider counseling, meditation and relaxation to balance other aspects of your life.

## 2017-04-24 ENCOUNTER — Encounter: Payer: Self-pay | Admitting: Family Medicine

## 2017-05-16 ENCOUNTER — Ambulatory Visit (INDEPENDENT_AMBULATORY_CARE_PROVIDER_SITE_OTHER): Payer: Medicare Other | Admitting: *Deleted

## 2017-05-16 DIAGNOSIS — Z23 Encounter for immunization: Secondary | ICD-10-CM | POA: Diagnosis not present

## 2017-05-26 ENCOUNTER — Other Ambulatory Visit: Payer: Self-pay | Admitting: Family Medicine

## 2017-06-05 NOTE — Progress Notes (Signed)
HPI:  Michaela [name] is a pleasant 66 y.o. here for follow up. Chronic medical problems summarized below were reviewed for changes. She is doing quite well. She has been working on her diet and getting a few miles of walking in a few days per week. She is very happy that she has had significant weight reduction. Her BMI is now down at around 26. She feels better off of the beta blocker. Her pulse is improved off the beta blocker. Continues the diuretic. Denies CP, SOB, DOE, treatment intolerance or new symptoms. Due for labs  HTN/hx palpitations: -on diuretic -weaned off BB in 2018 due to lower HR -reports: doing well -denies: CP, SOB, palpitations, increased dizziness -getting more activity, less stress, eating healthy  Obesity/HLD: -meds: atorvastatin -wt 163 02/2016 ---> today 11/18 149 -she has been work hard on eating healthier and tries to walk 2-3 miles a few times per week -she is pleased with her wt reduction  Seasonal allergies: -meds: flonase, allegra   ROS: See pertinent positives and negatives per HPI.  Past Medical History:  Diagnosis Date  . Allergy   . Arthritis    osteoarthritis  . Foot deformity    saw podiatrist remotely  . Heel spur   . Hyperlipidemia   . Hypertension   . Migraine     Past Surgical History:  Procedure Laterality Date  . no prior surgery      Family History  Problem Relation Age of Onset  . Heart disease Mother   . Stroke Mother   . Early death Father        accidental death - trauma  . Colon cancer Paternal Uncle 75  . Stomach cancer Neg Hx     Social History   Social History  . Marital status: Married    Spouse name: N/A  . Number of children: N/A  . Years of education: N/A   Occupational History  . teacher    Social History Main Topics  . Smoking status: Former Smoker    Quit date: 10/03/1981  . Smokeless tobacco: Never Used     Comment: smoked remotely for 10 years, quit in 1983  . Alcohol use Yes     Comment:  3 glasses about 4 days per week  . Drug use: No  . Sexual activity: Yes   Other Topics Concern  . 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  atorvastatin (LIPITOR) 40 MG tablet, TAKE 1 TABLET (40 MG TOTAL) BY MOUTH 2 (TWO) TIMES A WEEK., Disp: 26 tablet, Rfl: 4 .  fexofenadine (ALLEGRA) 60 MG tablet, Take 60 mg by mouth daily., Disp: , Rfl:  .  fluticasone (FLONASE) 50 MCG/ACT nasal spray, SHAKE WELL BEFORE EACH USE AND INSTILL 1 TO 2 SPRAYS IN EACH NOSTRIL ONCE DAILY, Disp: 16 g, Rfl: 3 .  hydrochlorothiazide (MICROZIDE) 12.5 MG capsule, Take 1 capsule (12.5 mg total) by mouth daily., Disp: 90 capsule, Rfl: 3 .  Probiotic Product (PROBIOTIC DAILY PO), Take by mouth., Disp: , Rfl:  .  Probiotic Product (PROBIOTIC PO), Take by mouth., Disp: ,  Rfl:  .  tretinoin (RETIN-A) 0.025 % cream, APPLY ONCE DAILY TO FACE AS DIRECTED, Disp: 45 g, Rfl: 3  EXAM:  Vitals:   06/06/17 0850  BP: 119/80  Pulse: 86  Temp: 98.1 F (36.7 C)    Body mass index is 26.41 kg/m.  GENERAL: vitals reviewed and listed above, alert, oriented, appears well hydrated and in no acute distress  HEENT: atraumatic, conjunttiva clear, no obvious abnormalities on inspection of external nose and ears  NECK: no obvious masses on inspection  LUNGS: clear to auscultation bilaterally, no wheezes, rales or rhonchi, good air movement  CV: HRRR, no peripheral edema  MS: moves all extremities without noticeable abnormality  PSYCH: pleasant and cooperative, no obvious depression or anxiety  ASSESSMENT AND PLAN:  Discussed the following assessment and plan:  Essential hypertension - Plan: Basic metabolic panel,  CBC  Hyperlipidemia, unspecified hyperlipidemia type  BMI 26.0-26.9,adult  -Am so glad she has been eating healthier and getting exercise and has had a significant weight reduction -Did encourage her to continue the exercises. The winter even if it is cold or rainy out and continue the healthy diet -We opted to continue her blood pressure medication at the same dose for now -She will be due for her physical and annual wellness visit in February -Patient advised to return or notify a doctor immediately if symptoms worsen or persist or new concerns arise.  Patient Instructions  BEFORE YOU LEAVE: -labs -follow up: AWV with Darl PikesSusan and CPE with Dr. Selena BattenKim in February  Keep up the great work! Do have had significant weight loss since last year. Her BMI is almost in the normal range now. I'm glad you're doing so well and that you are feeling well.  Continue your current blood pressure medication and cholesterol medication.  We have ordered labs or studies at this visit. It can take up to 1-2 weeks for results and processing. IF results require follow up or explanation, we will call you with instructions. Clinically stable results will be released to your [facility]. If you have not heard from us or cannot find your results in [facility] in 2 weeks please contact our office at [phone].  If you are not yet signed up for [facility], please consider signing up.  WE NOW OFFER   [facility] FAST TRACK!!!  SAME DAY Appointments for ACUTE CARE  Such as: Sprains, Injuries, cuts, abrasions, rashes, muscle pain, joint pain, back pain Colds, flu, sore throats, headache, allergies, cough, fever  Ear pain, sinus and eye infections Abdominal pain, nausea, vomiting, diarrhea, upset stomach Animal/insect bites  3 Easy Ways to Schedule: Walk-In Scheduling Call in scheduling Mychart Sign-up: [email or web address]               Kriste BasqueKIM, HANNAH R., DO

## 2017-06-06 ENCOUNTER — Encounter: Payer: Self-pay | Admitting: Family Medicine

## 2017-06-06 ENCOUNTER — Ambulatory Visit (INDEPENDENT_AMBULATORY_CARE_PROVIDER_SITE_OTHER): Payer: Medicare Other | Admitting: Family Medicine

## 2017-06-06 VITALS — BP 119/80 | HR 86 | Temp 98.1°F | Ht 63.0 in | Wt 149.1 lb

## 2017-06-06 DIAGNOSIS — Z6826 Body mass index (BMI) 26.0-26.9, adult: Secondary | ICD-10-CM

## 2017-06-06 DIAGNOSIS — E785 Hyperlipidemia, unspecified: Secondary | ICD-10-CM | POA: Diagnosis not present

## 2017-06-06 DIAGNOSIS — I10 Essential (primary) hypertension: Secondary | ICD-10-CM

## 2017-06-06 LAB — BASIC METABOLIC PANEL
BUN: 14 mg/dL (ref 6–23)
CHLORIDE: 99 meq/L (ref 96–112)
CO2: 34 meq/L — AB (ref 19–32)
Calcium: 9.5 mg/dL (ref 8.4–10.5)
Creatinine, Ser: 0.63 mg/dL (ref 0.40–1.20)
GFR: 100.5 mL/min (ref >60.00)
Glucose, Bld: 86 mg/dL (ref 70–99)
Potassium: 3.3 mEq/L — ABNORMAL LOW (ref 3.5–5.1)
SODIUM: 139 meq/L (ref 135–145)

## 2017-06-06 LAB — CBC
HEMATOCRIT: 41.3 % (ref 36.0–46.0)
Hemoglobin: 13.8 g/dL (ref 12.0–15.0)
MCHC: 33.3 g/dL (ref 30.0–36.0)
MCV: 89.2 fl (ref 78.0–100.0)
PLATELETS: 205 10*3/uL (ref 150.0–400.0)
RBC: 4.63 Mil/uL (ref 3.87–5.11)
RDW: 13.3 % (ref 11.5–15.5)
WBC: 4.4 10*3/uL (ref 4.0–10.5)

## 2017-06-06 NOTE — Patient Instructions (Signed)
BEFORE YOU LEAVE: -labs -follow up: AWV with Darl PikesSusan and CPE with Dr. Selena BattenKim in February  Keep up the great work! Do have had significant weight loss since last year. Her BMI is almost in the normal range now. I'm glad you're doing so well and that you are feeling well.  Continue your current blood pressure medication and cholesterol medication.  We have ordered labs or studies at this visit. It can take up to 1-2 weeks for results and processing. IF results require follow up or explanation, we will call you with instructions. Clinically stable results will be released to your [facility]ART. If you have not heard from us or cannot find your results in [facility]ART in 2 weeks please contact our office at [phone].  If you are not yet signed up for [facility]ART, please consider signing up.  WE NOW OFFER    [facility] FAST TRACK!!!  SAME DAY Appointments for ACUTE CARE  Such as: Sprains, Injuries, cuts, abrasions, rashes, muscle pain, joint pain, back pain Colds, flu, sore throats, headache, allergies, cough, fever  Ear pain, sinus and eye infections Abdominal pain, nausea, vomiting, diarrhea, upset stomach Animal/insect bites  3 Easy Ways to Schedule: Walk-In Scheduling Call in scheduling Mychart Sign-up: [email or web address]

## 2017-06-14 ENCOUNTER — Emergency Department (HOSPITAL_COMMUNITY): Payer: Medicare Other

## 2017-06-14 ENCOUNTER — Other Ambulatory Visit: Payer: Self-pay

## 2017-06-14 ENCOUNTER — Encounter (HOSPITAL_COMMUNITY): Payer: Self-pay | Admitting: *Deleted

## 2017-06-14 ENCOUNTER — Observation Stay (HOSPITAL_COMMUNITY)
Admission: EM | Admit: 2017-06-14 | Discharge: 2017-06-17 | Disposition: A | Discharge status: Home / Self Care | Payer: Medicare Other | Attending: Nephrology | Admitting: Nephrology

## 2017-06-14 DIAGNOSIS — Z882 Allergy status to sulfonamides status: Secondary | ICD-10-CM | POA: Diagnosis not present

## 2017-06-14 DIAGNOSIS — I10 Essential (primary) hypertension: Secondary | ICD-10-CM | POA: Insufficient documentation

## 2017-06-14 DIAGNOSIS — H532 Diplopia: Secondary | ICD-10-CM | POA: Diagnosis not present

## 2017-06-14 DIAGNOSIS — Z79899 Other long term (current) drug therapy: Secondary | ICD-10-CM | POA: Insufficient documentation

## 2017-06-14 DIAGNOSIS — Z87891 Personal history of nicotine dependence: Secondary | ICD-10-CM | POA: Insufficient documentation

## 2017-06-14 DIAGNOSIS — J0130 Acute sphenoidal sinusitis, unspecified: Secondary | ICD-10-CM | POA: Diagnosis not present

## 2017-06-14 DIAGNOSIS — R112 Nausea with vomiting, unspecified: Secondary | ICD-10-CM | POA: Insufficient documentation

## 2017-06-14 DIAGNOSIS — E785 Hyperlipidemia, unspecified: Secondary | ICD-10-CM | POA: Insufficient documentation

## 2017-06-14 DIAGNOSIS — M1990 Unspecified osteoarthritis, unspecified site: Secondary | ICD-10-CM | POA: Diagnosis not present

## 2017-06-14 DIAGNOSIS — E876 Hypokalemia: Secondary | ICD-10-CM

## 2017-06-14 DIAGNOSIS — R42 Dizziness and giddiness: Principal | ICD-10-CM

## 2017-06-14 DIAGNOSIS — R000 Tachycardia, unspecified: Secondary | ICD-10-CM | POA: Diagnosis not present

## 2017-06-14 LAB — CBC
HCT: 39.5 % (ref 36.0–46.0)
Hemoglobin: 13.8 g/dL (ref 12.0–15.0)
MCH: 30 pg (ref 26.0–34.0)
MCHC: 34.9 g/dL (ref 30.0–36.0)
MCV: 85.9 fL (ref 78.0–100.0)
Platelets: 193 10*3/uL (ref 150–400)
RBC: 4.6 MIL/uL (ref 3.87–5.11)
RDW: 12.9 % (ref 11.5–15.5)
WBC: 7.4 10*3/uL (ref 4.0–10.5)

## 2017-06-14 LAB — COMPREHENSIVE METABOLIC PANEL
ALT: 17 U/L (ref 14–54)
AST: 17 U/L (ref 15–41)
Albumin: 4.5 g/dL (ref 3.5–5.0)
Alkaline Phosphatase: 40 U/L (ref 38–126)
Anion gap: 13 (ref 5–15)
BUN: 18 mg/dL (ref 6–20)
CO2: 25 mmol/L (ref 22–32)
Calcium: 9.2 mg/dL (ref 8.9–10.3)
Chloride: 103 mmol/L (ref 101–111)
Creatinine, Ser: 0.6 mg/dL (ref 0.44–1.00)
GFR calc Af Amer: >60 mL/min (ref >60)
GFR calc non Af Amer: >60 mL/min (ref >60)
Glucose, Bld: 131 mg/dL — ABNORMAL HIGH (ref 65–99)
Potassium: 3 mmol/L — ABNORMAL LOW (ref 3.5–5.1)
Sodium: 141 mmol/L (ref 135–145)
Total Bilirubin: 1 mg/dL (ref 0.3–1.2)
Total Protein: 7.1 g/dL (ref 6.5–8.1)

## 2017-06-14 LAB — LIPASE, BLOOD: Lipase: 23 U/L (ref 11–51)

## 2017-06-14 MED ORDER — POTASSIUM CHLORIDE CRYS ER 20 MEQ PO TBCR
60.0000 meq | EXTENDED_RELEASE_TABLET | Freq: Once | ORAL | Status: AC
  Administered 2017-06-14: 60 meq via ORAL
  Filled 2017-06-14: qty 3

## 2017-06-14 MED ORDER — LORAZEPAM 0.5 MG PO TABS
0.5000 mg | ORAL_TABLET | Freq: Once | ORAL | Status: AC
  Administered 2017-06-14: 0.5 mg via ORAL
  Filled 2017-06-14: qty 1

## 2017-06-14 MED ORDER — LORAZEPAM 2 MG/ML IJ SOLN
1.0000 mg | Freq: Once | INTRAMUSCULAR | Status: AC
  Administered 2017-06-14: 1 mg via INTRAVENOUS
  Filled 2017-06-14: qty 1

## 2017-06-14 MED ORDER — PROMETHAZINE HCL 25 MG PO TABS
12.5000 mg | ORAL_TABLET | Freq: Once | ORAL | Status: AC
  Administered 2017-06-14: 12.5 mg via ORAL
  Filled 2017-06-14: qty 1

## 2017-06-14 MED ORDER — MECLIZINE HCL 25 MG PO TABS
25.0000 mg | ORAL_TABLET | Freq: Once | ORAL | Status: AC
  Administered 2017-06-14: 25 mg via ORAL
  Filled 2017-06-14: qty 1

## 2017-06-14 NOTE — ED Notes (Signed)
Bed: ON62WA14 Expected date:  Expected time:  Means of arrival:  Comments: dizziness

## 2017-06-14 NOTE — ED Provider Notes (Addendum)
[facility]-EMERGENCY DEPT Provider Note   CSN: [id number] Arrival date & time: 06/14/17  1840     History   Chief Complaint Chief Complaint  Patient presents with  . Dizziness  . Nausea  . Emesis    HPI Michaela [name] is a 66 y.o. female.  HPI   65yF with vertigo. Woke up around 0300 today with sensation of the room spinning and nausea.  This persisted throughout the day.  Worse with certain movements.  Improved when still.  No tinnitius.  No headaches.  Feels extremely off balance.  Past Medical History:  Diagnosis Date  . Allergy   . Arthritis    osteoarthritis  . Foot deformity    saw podiatrist remotely  . Heel spur   . Hyperlipidemia   . Hypertension   . Migraine     Patient Active Problem List   Diagnosis Date Noted  . Osteoarthritis 10/26/2007  . Allergic rhinitis 05/05/2007  . Essential hypertension 04/21/2007  . Hyperlipemia 02/26/2007    Past Surgical History:  Procedure Laterality Date  . no prior surgery      OB History    No data available       Home Medications    Prior to Admission medications   Medication Sig Start Date End Date Taking? Authorizing Provider  atorvastatin (LIPITOR) 40 MG tablet TAKE 1 TABLET (40 MG TOTAL) BY MOUTH 2 (TWO) TIMES A WEEK. 05/26/17   [name], Hannah R, DO  fexofenadine (ALLEGRA) 60 MG tablet Take 60 mg by mouth daily.    [provider]  fluticasone (FLONASE) 50 MCG/ACT nasal spray SHAKE WELL BEFORE EACH USE AND INSTILL 1 TO 2 SPRAYS IN EACH NOSTRIL ONCE DAILY 08/12/16   [name], Hannah R, DO  hydrochlorothiazide (MICROZIDE) 12.5 MG capsule Take 1 capsule (12.5 mg total) by mouth daily. 09/05/16   [name], Hannah R, DO  Probiotic Product (PROBIOTIC DAILY PO) Take by mouth.    [provider]  Probiotic Product (PROBIOTIC PO) Take by mouth.    [provider]  tretinoin (RETIN-A) 0.025 % cream APPLY ONCE DAILY TO FACE AS DIRECTED 01/25/15   [name], Hannah R, DO    Family  History Family History  Problem Relation Age of Onset  . Heart disease Mother   . Stroke Mother   . Early death Father        accidental death - trauma  . Colon cancer Paternal Uncle [id number]  . Stomach cancer Neg Hx     Social History Social History   Tobacco Use  . Smoking status: Former Smoker    Last attempt to quit: 10/03/1981    Years since quitting: 35.7  . Smokeless tobacco: Never Used  . Tobacco comment: smoked remotely for 10 years, quit in 1983  Substance Use Topics  . Alcohol use: Yes    Comment: 3 glasses about 4 days per week  . Drug use: No     Allergies   Sulfonamide derivatives   Review of Systems Review of Systems  All systems reviewed and negative, other than as noted in HPI.  Physical Exam Updated Vital Signs BP 137/73 (BP Location: Right Arm)   Pulse 78   Temp 97.9 F (36.6 C) (Oral)   Resp 14   Ht 5\' 3"  (1.6 m)   Wt 67.6 kg (149 lb)   SpO2 100%   BMI 26.39 kg/m   Physical Exam  Constitutional: She is oriented to person, place, and time. She appears  well-developed and well-nourished. No distress.  HENT:  Head: Normocephalic and atraumatic.  Eyes: Right eye exhibits no discharge. Left eye exhibits no discharge.  Constant nystagmus even with primary gaze. Unilateral to L. Doesn't clearly attenuate with fixation. So much that hard to check head impulse and test of skew.  Neck: Neck supple.  Cardiovascular: Normal rate, regular rhythm and normal heart sounds. Exam reveals no gallop and no friction rub.  No murmur heard. Pulmonary/Chest: Effort normal and breath sounds normal. No respiratory distress.  Abdominal: Soft. She exhibits no distension. There is no tenderness.  Musculoskeletal: She exhibits no edema or tenderness.  Neurological: She is alert and oriented to person, place, and time. No cranial nerve deficit.  Good finger to nose b/l. Didn't try to walk because she was so unsteady just with trying to stand.   Skin: Skin is warm and dry.    Psychiatric: She has a normal mood and affect. Her behavior is normal. Thought content normal.  Nursing note and vitals reviewed.    ED Treatments / Results  Labs (all labs ordered are listed, but only abnormal results are displayed) Labs Reviewed  COMPREHENSIVE METABOLIC PANEL - Abnormal; Notable for the following components:      Result Value   Potassium 3.0 (*)    Glucose, Bld 131 (*)    All other components within normal limits  LIPASE, BLOOD  CBC  URINALYSIS, ROUTINE W REFLEX MICROSCOPIC    EKG  EKG Interpretation None       Radiology No results found.  Procedures Procedures (including critical care time)  Medications Ordered in ED Medications  LORazepam (ATIVAN) injection 1 mg (not administered)  meclizine (ANTIVERT) tablet 25 mg (25 mg Oral Given 06/14/17 1952)  promethazine (PHENERGAN) tablet 12.5 mg (12.5 mg Oral Given 06/14/17 1953)  LORazepam (ATIVAN) tablet 0.5 mg (0.5 mg Oral Given 06/14/17 1952)  potassium chloride SA (K-DUR,KLOR-CON) CR tablet 60 mEq (60 mEq Oral Given 06/14/17 2037)     Initial Impression / Assessment and Plan / ED Course  I have reviewed the triage vital signs and the nursing notes.  Pertinent labs & imaging results that were available during my care of the patient were reviewed by me and considered in my medical decision making (see chart for details).     65yF with vertigo. Pretty impressive nystagmus. It's horizontal and unilateral which is reassuring, but it's present and constant even with primary gaze that it's difficult to reliably do head impulse testing or test of skew.   Really no improvement with meds. Will MRI. [facility] Pt updated. Discussed with Dr Jeraldine LootsLockwood.   10:36 PM Informed [facility] Transfer canceled.   Final Clinical Impressions(s) / ED Diagnoses   Final diagnoses:  Vertigo    ED Discharge Orders    None       Raeford RazorKohut, Asia Favata, MD 06/14/17 2224     Raeford RazorKohut, Bambi Fehnel, MD 06/14/17 [id number]

## 2017-06-14 NOTE — ED Notes (Signed)
Patient transported to MRI 

## 2017-06-14 NOTE — ED Triage Notes (Signed)
EMS reports pt developed Dizziness, nausea, vomiting and nystagmus. IV started by EMS.

## 2017-06-15 ENCOUNTER — Encounter (HOSPITAL_COMMUNITY): Payer: Self-pay | Admitting: Internal Medicine

## 2017-06-15 DIAGNOSIS — R42 Dizziness and giddiness: Secondary | ICD-10-CM

## 2017-06-15 DIAGNOSIS — R000 Tachycardia, unspecified: Secondary | ICD-10-CM | POA: Diagnosis not present

## 2017-06-15 DIAGNOSIS — E876 Hypokalemia: Secondary | ICD-10-CM | POA: Diagnosis not present

## 2017-06-15 LAB — URINALYSIS, ROUTINE W REFLEX MICROSCOPIC
Bilirubin Urine: NEGATIVE
Glucose, UA: NEGATIVE mg/dL
Hgb urine dipstick: NEGATIVE
Ketones, ur: 80 mg/dL — AB
Leukocytes, UA: NEGATIVE
Nitrite: NEGATIVE
Protein, ur: NEGATIVE mg/dL
Specific Gravity, Urine: 1.026 (ref 1.005–1.030)
pH: 5 (ref 5.0–8.0)

## 2017-06-15 LAB — BASIC METABOLIC PANEL
Anion gap: 8 (ref 5–15)
BUN: 19 mg/dL (ref 6–20)
CALCIUM: 9.1 mg/dL (ref 8.9–10.3)
CO2: 27 mmol/L (ref 22–32)
CREATININE: 0.72 mg/dL (ref 0.44–1.00)
Chloride: 104 mmol/L (ref 101–111)
GFR calc non Af Amer: >60 mL/min (ref >60)
GLUCOSE: 113 mg/dL — AB (ref 65–99)
Potassium: 3.1 mmol/L — ABNORMAL LOW (ref 3.5–5.1)
Sodium: 139 mmol/L (ref 135–145)

## 2017-06-15 LAB — MAGNESIUM: Magnesium: 2.1 mg/dL (ref 1.7–2.4)

## 2017-06-15 LAB — TROPONIN I: Troponin I: <0.03 ng/mL (ref <0.03)

## 2017-06-15 LAB — TSH: TSH: 1.313 u[IU]/mL (ref 0.350–4.500)

## 2017-06-15 MED ORDER — MECLIZINE HCL 25 MG PO TABS: 25.0000 mg | ORAL_TABLET | Freq: Three times a day (TID) | ORAL | 0 refills | Status: DC | PRN

## 2017-06-15 MED ORDER — AMOXICILLIN 500 MG PO CAPS
500.0000 mg | ORAL_CAPSULE | Freq: Three times a day (TID) | ORAL | Status: DC
  Administered 2017-06-15 – 2017-06-17 (×7): 500 mg via ORAL
  Filled 2017-06-15 (×8): qty 1

## 2017-06-15 MED ORDER — MECLIZINE HCL 25 MG PO TABS
25.0000 mg | ORAL_TABLET | Freq: Once | ORAL | Status: AC
  Administered 2017-06-15: 25 mg via ORAL
  Filled 2017-06-15: qty 1

## 2017-06-15 MED ORDER — HYDROCHLOROTHIAZIDE 12.5 MG PO CAPS
12.5000 mg | ORAL_CAPSULE | Freq: Every day | ORAL | Status: DC
  Administered 2017-06-15 – 2017-06-16 (×2): 12.5 mg via ORAL
  Filled 2017-06-15 (×2): qty 1

## 2017-06-15 MED ORDER — DIAZEPAM 5 MG PO TABS
5.0000 mg | ORAL_TABLET | Freq: Four times a day (QID) | ORAL | Status: DC | PRN
  Administered 2017-06-15: 5 mg via ORAL
  Filled 2017-06-15: qty 1

## 2017-06-15 MED ORDER — POTASSIUM CHLORIDE CRYS ER 20 MEQ PO TBCR
40.0000 meq | EXTENDED_RELEASE_TABLET | Freq: Once | ORAL | Status: AC
  Administered 2017-06-15: 40 meq via ORAL
  Filled 2017-06-15: qty 2

## 2017-06-15 MED ORDER — FLUTICASONE PROPIONATE 50 MCG/ACT NA SUSP
2.0000 | Freq: Every day | NASAL | Status: DC
  Administered 2017-06-15 – 2017-06-17 (×3): 2 via NASAL
  Filled 2017-06-15 (×2): qty 16

## 2017-06-15 MED ORDER — MECLIZINE HCL 25 MG PO TABS
25.0000 mg | ORAL_TABLET | Freq: Four times a day (QID) | ORAL | Status: DC | PRN
  Administered 2017-06-15 (×3): 25 mg via ORAL
  Filled 2017-06-15 (×3): qty 1

## 2017-06-15 MED ORDER — ENOXAPARIN SODIUM 40 MG/0.4ML ~~LOC~~ SOLN
40.0000 mg | SUBCUTANEOUS | Status: DC
  Administered 2017-06-15 – 2017-06-17 (×3): 40 mg via SUBCUTANEOUS
  Filled 2017-06-15 (×3): qty 0.4

## 2017-06-15 NOTE — Progress Notes (Signed)
Patient was seen and examined at bedside.  Admitted after midnight.  She has not been for details. 66 year old female with history of hypertension, hyperlipidemia, migraine presented with nausea vomiting and dizziness consistent with possible vertigo.  MRI of the brain with acute sphenoid sinusitis otherwise no other acute finding.  Continue amoxicillin, PT OT evaluation.  Meclizine as needed.  Patient with hypokalemia which was repleted.  Check BMP and magnesium level.  Continue supportive care.  Discussed with the patient and her husband at bedside.  Denies headache, chest pain, shortness of breath, dysuria, urgency diarrhea or constipation. DVT prophylaxis with Lovenox subcu denies.

## 2017-06-15 NOTE — H&P (Signed)
TRH H&P   Patient Demographics:    Michaela [name], is a 66 y.o. female  MRN: [id number]   DOB - Jul 25, 1951  Admit Date - 06/14/2017  Outpatient Primary MD for the patient is [name], Hannah R, DO  Referring MD/NP/PA: Ross Marcusourtney Horton  Outpatient Specialists:   Patient coming from: home  Chief Complaint  Patient presents with  . Dizziness  . Nausea  . Emesis      HPI:    Michaela [name]  is a 66 y.o. female, w hypertension, hyperlipidemia, migraine, apparently c/o vertigo, starting at 3am yesterday.  Pt noted some associated n/v,  Denies tinnitus, or hearing loss.  Not certain about family hx of vertigo.  Denies headache, cp, palp, sob, focal neurological symptoms  In ER,  K 3.0,  Bun 18, Creatinine 0.6 Wbc 7.4, Hgb 13.8, Plt 193  MRI Brain  IMPRESSION: 1. Negative mildly motion degraded noncontrast MRI of the head for age. 2. Mild acute sphenoid sinusitis.        Review of systems:    In addition to the HPI above,  No Fever-chills, No Headache, No changes with Vision or hearing, No problems swallowing food or Liquids, No Chest pain, Cough or Shortness of Breath, No Abdominal pain, No Nausea or Vommitting, Bowel movements are regular, No Blood in stool or Urine, No dysuria, No new skin rashes or bruises, No new joints pains-aches,  No new weakness, tingling, numbness in any extremity, No recent weight gain or loss, No polyuria, polydypsia or polyphagia, No significant Mental Stressors.  A full 10 point Review of Systems was done, except as stated above, all other Review of Systems were negative.   With Past History of the following :    Past Medical History:  Diagnosis Date  . Allergy   . Arthritis    osteoarthritis  . Foot deformity    saw podiatrist remotely  . Heel spur   . Hyperlipidemia   . Hypertension   . Migraine       Past Surgical  History:  Procedure Laterality Date  . no prior surgery        Social History:     Social History   Tobacco Use  . Smoking status: Former Smoker    Last attempt to quit: 10/03/1981    Years since quitting: 35.7  . Smokeless tobacco: Never Used  . Tobacco comment: smoked remotely for 10 years, quit in 1983  Substance Use Topics  . Alcohol use: Yes    Comment: 3 glasses about 4 days per week     Lives - at home  Mobility -  Walks by self   Family History :     Family History  Problem Relation Age of Onset  . Heart disease Mother   . Stroke Mother   . Early death Father        accidental death -  trauma  . Colon cancer Paternal Uncle [id number]  . Stomach cancer Neg Hx       Home Medications:   Prior to Admission medications   Medication Sig Start Date End Date Taking? Authorizing Provider  atorvastatin (LIPITOR) 40 MG tablet TAKE 1 TABLET (40 MG TOTAL) BY MOUTH 2 (TWO) TIMES A WEEK. 05/26/17  Yes Kriste BasqueKim, Hannah R, DO  fluticasone (FLONASE) 50 MCG/ACT nasal spray SHAKE WELL BEFORE EACH USE AND INSTILL 1 TO 2 SPRAYS IN EACH NOSTRIL ONCE DAILY Patient taking differently: SHAKE WELL BEFORE EACH USE AND INSTILL 1 TO 2 SPRAYS IN EACH NOSTRIL ONCE DAILY AS NEEDED FOR ALLERGIES 08/12/16  Yes [name], Hannah R, DO  hydrochlorothiazide (MICROZIDE) 12.5 MG capsule Take 1 capsule (12.5 mg total) by mouth daily. 09/05/16  Yes [name], Hannah R, DO  Probiotic Product (PROBIOTIC PO) Take 1 tablet daily by mouth.    Yes [provider]  tretinoin (RETIN-A) 0.025 % cream APPLY ONCE DAILY TO FACE AS DIRECTED 01/25/15  Yes [name], Hannah R, DO  meclizine (ANTIVERT) 25 MG tablet Take 1 tablet (25 mg total) 3 (three) times daily as needed by mouth for dizziness. 06/15/17   Horton, Mayer Maskerourtney F, MD     Allergies:     Allergies  Allergen Reactions  . Sulfonamide Derivatives     REACTION: Hives     Physical Exam:   Vitals  Blood pressure 119/65, pulse 70, temperature 97.8 F (36.6 C), temperature  source Oral, resp. rate 15, height 5\' 3"  (1.6 m), weight 67.6 kg (149 lb), SpO2 98 %.   1. General  lying in bed in NAD,   2. Normal affect and insight, Not Suicidal or Homicidal, Awake Alert, Oriented X 3.  3. No F.N deficits, ALL C.Nerves Intact, Strength 5/5 all 4 extremities, Sensation intact all 4 extremities, Plantars down going.  4. Ears and Eyes appear Normal, Conjunctivae clear, PERRLA. Moist Oral Mucosa.  5. Supple Neck, No JVD, No cervical lymphadenopathy appriciated, No Carotid Bruits.  6. Symmetrical Chest wall movement, Good air movement bilaterally, CTAB.  7. RRR, No Gallops, Rubs or Murmurs, No Parasternal Heave.  8. Positive Bowel Sounds, Abdomen Soft, No tenderness, No organomegaly appriciated,No rebound -guarding or rigidity.  9.  No Cyanosis, Normal Skin Turgor, No Skin Rash or Bruise.  10. Good muscle tone,  joints appear normal , no effusions, Normal ROM.  11. No Palpable Lymph Nodes in Neck or Axillae  Good finger to nose   Data Review:    CBC Recent Labs  Lab 06/14/17 1901  WBC 7.4  HGB 13.8  HCT 39.5  PLT 193  MCV 85.9  MCH 30.0  MCHC 34.9  RDW 12.9   ------------------------------------------------------------------------------------------------------------------  Chemistries  Recent Labs  Lab 06/14/17 1901  NA 141  K 3.0*  CL 103  CO2 25  GLUCOSE 131*  BUN 18  CREATININE 0.60  CALCIUM 9.2  AST 17  ALT 17  ALKPHOS 40  BILITOT 1.0   ------------------------------------------------------------------------------------------------------------------ estimated creatinine clearance is 64.7 mL/min (by C-G formula based on SCr of 0.6 mg/dL). ------------------------------------------------------------------------------------------------------------------ No results for input(s): TSH, T4TOTAL, T3FREE, THYROIDAB in the last 72 hours.  Invalid input(s): FREET3  Coagulation profile No results for input(s): INR, PROTIME in the last 168  hours. ------------------------------------------------------------------------------------------------------------------- No results for input(s): DDIMER in the last 72 hours. -------------------------------------------------------------------------------------------------------------------  Cardiac Enzymes No results for input(s): CKMB, TROPONINI, MYOGLOBIN in the last 168 hours.  Invalid input(s): CK ------------------------------------------------------------------------------------------------------------------ No results found for: BNP   ---------------------------------------------------------------------------------------------------------------  Urinalysis    Component Value Date/Time   COLORURINE YELLOW 06/14/2017 1901   APPEARANCEUR CLEAR 06/14/2017 1901   LABSPEC 1.026 06/14/2017 1901   PHURINE 5.0 06/14/2017 1901   GLUCOSEU NEGATIVE 06/14/2017 1901   HGBUR NEGATIVE 06/14/2017 1901   HGBUR negative 03/29/2010 0759   BILIRUBINUR NEGATIVE 06/14/2017 1901   BILIRUBINUR n 10/06/2013 1032   KETONESUR 80 (A) 06/14/2017 1901   PROTEINUR NEGATIVE 06/14/2017 1901   UROBILINOGEN 0.2 10/06/2013 1032   UROBILINOGEN 0.2 03/29/2010 0759   NITRITE NEGATIVE 06/14/2017 1901   LEUKOCYTESUR NEGATIVE 06/14/2017 1901    ----------------------------------------------------------------------------------------------------------------   Imaging Results:    Mr Brain Wo Contrast  Result Date: 06/14/2017 CLINICAL DATA:  Vertigo and nausea beginning last night. History of hypertension, hyperlipidemia and migraine. EXAM: MRI HEAD WITHOUT CONTRAST TECHNIQUE: Multiplanar, multiecho pulse sequences of the brain and surrounding structures were obtained without intravenous contrast. COMPARISON:  None. FINDINGS: Mildly motion degraded examination. BRAIN: No reduced diffusion to suggest acute ischemia. No susceptibility artifact to suggest hemorrhage. The ventricles and sulci are normal for  patient's age. No suspicious parenchymal signal, mass or mass effect. No abnormal extra-axial fluid collections. VASCULAR: Normal major intracranial vascular flow voids present at skull base. SKULL AND UPPER CERVICAL SPINE: No abnormal sellar expansion. No suspicious calvarial bone marrow signal. Small sessile LEFT frontal calvarial exostosis. Craniocervical junction maintained. SINUSES/ORBITS: Mild less sphenoid sinus air-fluid level. The included ocular globes and orbital contents are non-suspicious. OTHER: None. IMPRESSION: 1. Negative mildly motion degraded noncontrast MRI of the head for age. 2. Mild acute sphenoid sinusitis. Electronically Signed   By: Awilda Metro M.D.   On: 06/14/2017 23:47      Assessment & Plan:    Principal Problem:   Vertigo Active Problems:   Hypokalemia   Tachycardia    Vertigo PT to evaluate and tx  Sphenoid sinusitis Amoxicillin 500mg  po tid  Hypokalemia Replete  Tachycardia Tele Trop I q6h x3 Check tsh If persistent then please check cardiac 2D echo   DVT Prophylaxis  Lovenox - SCDs   AM Labs Ordered, also please review Full Orders  Family Communication: Admission, patients condition and plan of care including tests being ordered have been discussed with the patient  who indicate understanding and agree with the plan and Code Status.  Code Status FULL CODE  Likely DC to  home  Condition GUARDED    Consults called: none  Admission status: observation   Time spent in minutes : 45   Pearson Grippe M.D on 06/15/2017 at 5:47 AM  Between 7am to 7pm - Pager - [phone] . After 7pm go to [email or web address] - password [facility]  [facility] - Office  [phone]

## 2017-06-15 NOTE — ED Provider Notes (Addendum)
Patient signed out pending MRI.  MRI is negative for acute stroke.   Clinical Course as of Jun 15 506  Wynelle LinkSun Jun 15, 2017  0101 MRI is negative.  On recheck, patient is resting comfortably.  States that she feels somewhat better but is groggy from medications.  Will allow her to rest.  Plan for ambulation and p.o. challenge.  [CH]  0500 Second attempt at ambulation.  Patient feels much better; however, she still requires assistance with ambulation.  She has had multiple medications.  She continues to have fairly significant nystagmus.  Given persistent vertiginous symptoms, will admit for further management.  [CH]    Clinical Course User Index [CH] Horton, Mayer Maskerourtney F, MD      Shon BatonHorton, Courtney F, MD 06/15/17 0500    Shon BatonHorton, Courtney F, MD 06/15/17 [phone]

## 2017-06-15 NOTE — Progress Notes (Signed)
06/15/17  1315  Patient states the room is no longer spinning but I still eye double of everything.

## 2017-06-15 NOTE — ED Notes (Signed)
Pt was very unsteady on her feet. Pt was unable to ambulate by herself or with assistance

## 2017-06-15 NOTE — Plan of Care (Signed)
  Pain Managment: General experience of comfort will improve 06/15/2017 1409 - Progressing by William Daltonarpenter, Maliha Outten L, RN   Nutrition: Adequate nutrition will be maintained 06/15/2017 1409 - Progressing by William Daltonarpenter, Dulcie Gammon L, RN   Education: Knowledge of General Education information will improve 06/15/2017 1409 - Progressing by William Daltonarpenter, Pippa Hanif L, RN

## 2017-06-15 NOTE — ED Notes (Signed)
Pt was placed on the bedpan but was unable to give a sample RN Jari FavreOscar made aware.

## 2017-06-16 DIAGNOSIS — J0130 Acute sphenoidal sinusitis, unspecified: Secondary | ICD-10-CM

## 2017-06-16 DIAGNOSIS — R42 Dizziness and giddiness: Secondary | ICD-10-CM | POA: Diagnosis not present

## 2017-06-16 DIAGNOSIS — E876 Hypokalemia: Secondary | ICD-10-CM | POA: Diagnosis not present

## 2017-06-16 LAB — BASIC METABOLIC PANEL
ANION GAP: 6 (ref 5–15)
BUN: 16 mg/dL (ref 6–20)
CALCIUM: 9 mg/dL (ref 8.9–10.3)
CHLORIDE: 103 mmol/L (ref 101–111)
CO2: 28 mmol/L (ref 22–32)
Creatinine, Ser: 0.64 mg/dL (ref 0.44–1.00)
GFR calc non Af Amer: >60 mL/min (ref >60)
GLUCOSE: 110 mg/dL — AB (ref 65–99)
POTASSIUM: 3.3 mmol/L — AB (ref 3.5–5.1)
Sodium: 137 mmol/L (ref 135–145)

## 2017-06-16 LAB — HIV ANTIBODY (ROUTINE TESTING W REFLEX): HIV SCREEN 4TH GENERATION: NONREACTIVE

## 2017-06-16 MED ORDER — PREDNISONE 50 MG PO TABS
60.0000 mg | ORAL_TABLET | Freq: Every day | ORAL | Status: DC
  Administered 2017-06-16 – 2017-06-17 (×2): 60 mg via ORAL
  Filled 2017-06-16 (×2): qty 1

## 2017-06-16 MED ORDER — POTASSIUM CHLORIDE CRYS ER 20 MEQ PO TBCR
40.0000 meq | EXTENDED_RELEASE_TABLET | Freq: Every day | ORAL | Status: DC
  Administered 2017-06-17: 40 meq via ORAL
  Filled 2017-06-16: qty 2

## 2017-06-16 MED ORDER — POTASSIUM CHLORIDE CRYS ER 20 MEQ PO TBCR
40.0000 meq | EXTENDED_RELEASE_TABLET | Freq: Once | ORAL | Status: AC
  Administered 2017-06-16: 40 meq via ORAL
  Filled 2017-06-16: qty 2

## 2017-06-16 NOTE — Consult Note (Signed)
Neurology Consultation Reason for Consult: Vertigo Referring Physician: Ronalee BeltsBhandari, D  CC: Vertigo  History is obtained from: Patient  HPI: Michaela [name] is a 66 y.o. female who awoke from sleep at 3 AM yesterday morning with vertigo.  She states that then she tried to stand up and walk and she was having difficulty.  When she stayed at home, she did not get better and therefore around 5 AM came into the emergency department.  An MRI was performed which was negative, but given that she was still having some is difficulty walking, she is not safe to go home and therefore was admitted.  She complains of double vision, but when further pressed I think it is more oscillopsia.   ROS: A 14 point ROS was performed and is negative except as noted in the HPI.   Past Medical History:  Diagnosis Date  . Allergy   . Arthritis    osteoarthritis  . Foot deformity    saw podiatrist remotely  . Heel spur   . Hyperlipidemia   . Hypertension   . Migraine      Family History  Problem Relation Age of Onset  . Heart disease Mother   . Stroke Mother   . Early death Father        accidental death - trauma  . Colon cancer Paternal Uncle [id number]  . Stomach cancer Neg Hx      Social History:  reports that she quit smoking about 35 years ago. she has never used smokeless tobacco. She reports that she drinks alcohol. She reports that she does not use drugs.   Exam: Current vital signs: BP 135/63 (BP Location: Right Arm)   Pulse 66   Temp 97.8 F (36.6 C) (Oral)   Resp 18   Ht 5\' 3"  (1.6 m)   Wt 70.2 kg (154 lb 12.2 oz)   SpO2 100%   BMI 27.42 kg/m  Vital signs in last 24 hours: Temp:  [97.8 F (36.6 C)-98.4 F (36.9 C)] 97.8 F (36.6 C) (11/12 1515) Pulse Rate:  [60-98] 66 (11/12 1515) Resp:  [16-18] 18 (11/12 1515) BP: (112-135)/(58-77) 135/63 (11/12 1515) SpO2:  [96 %-100 %] 100 % (11/12 1515) Weight:  [70.2 kg (154 lb 12.2 oz)] 70.2 kg (154 lb 12.2 oz) (11/12 0506)   Physical Exam   Constitutional: Appears well-developed and well-nourished.  Psych: Affect appropriate to situation Eyes: No scleral injection HENT: No OP obstrucion Head: Normocephalic.  Cardiovascular: Normal rate and regular rhythm.  Respiratory: Effort normal and breath sounds normal to anterior ascultation GI: Soft.  No distension. There is no tenderness.  Skin: WDI  Neuro: Mental Status: Patient is awake, alert, oriented to person, place, month, year, and situation. Patient is able to give a clear and coherent history. No signs of aphasia or neglect Cranial Nerves: II: Visual Fields are full. Pupils are equal, round, and reactive to light.   III,IV, VI: EOMI without ptosis or diploplia.  She has left beating plus rotary nystagmus which is more prominent leftward gaze, but his left beating with rotary component in all directions of gaze. V: Facial sensation is symmetric to temperature VII: Facial movement is symmetric.  VIII: hearing is intact to voice X: Uvula elevates symmetrically XI: Shoulder shrug is symmetric. XII: tongue is midline without atrophy or fasciculations.  Motor: Tone is normal. Bulk is normal. 5/5 strength was present in all four extremities.  Sensory: Sensation is symmetric to light touch and temperature in the arms and  legs. Plantars: Toes are downgoing bilaterally.  Cerebellar: FNF and HKS are intact bilaterally  She has a positive head impulse test when turning the head to the right    I have reviewed labs in epic and the results pertinent to this consultation are: Mild hypokalemia  I have reviewed the images obtained: MRI brain-negative  Impression: 66 year old female with labyrinthitis.  Steroids can help speed recovery, and I would recommend doing a course of prednisone, 60 mg daily times 5 days followed by a 5-day taper.  Recommendations: 1) prednisone, 60 mg x 5 days, followed by 50, 40, 30, 20, 10 for a total of 10 days. 2) physical therapy 3) no  further recommend issues at this time, neurology will sign off, please call with further questions or concerns.  Ritta SlotMcNeill Neena Beecham, MD [facility] [phone]  If 7pm- 7am, please page neurology on call as listed in AMION.

## 2017-06-16 NOTE — Evaluation (Signed)
Occupational Therapy Evaluation and Discharge Patient Details Name: Michaela [name] MRN: [id number] DOB: [birth year] Today's Date: 06/16/2017    History of Present Illness [age] year old female with history of hypertension, hyperlipidemia, migraine presented with nausea, vomiting, and dizziness consistent with possible vertigo.  MRI of the brain with acute sphenoid sinusitis    Clinical Impression   This 66 yo female admitted with above presents to acute OT with all education completed, no further OT needs, we will sign off.    Follow Up Recommendations  No OT follow up    Equipment Recommendations  None recommended by OT       Precautions / Restrictions Precautions Precautions: Fall Precaution Comments: diplopia--now stating it is more blurry not necessarily double Restrictions Weight Bearing Restrictions: No      Mobility Bed Mobility Overal bed mobility: Modified Independent Bed Mobility: Supine to Sit     Supine to sit: Supervision     General bed mobility comments: increased time due to dizziness and needing to focus on a target or look to far right to help abate dizziness  Transfers Overall transfer level: Needs assistance   Transfers: Sit to/from Stand Sit to Stand: Min guard         General transfer comment: cues for taking it slow and target focusing    Balance Overall balance assessment: Needs assistance Sitting-balance support: No upper extremity supported;Feet supported Sitting balance-Leahy Scale: Good     Standing balance support: No upper extremity supported Standing balance-Leahy Scale: Fair Standing balance comment: tenative up on feet due to dizziness                           ADL either performed or assessed with clinical judgement   ADL Overall ADL's : Modified independent                                       General ADL Comments: Has to take increased time due to dizziness when up on her feet. Recommended to  pt to either get a seat to sit on in shower or take a tub bath. Also recommended to her to be really careful in kitchen if handling hot or sharp objects due to dizziness--she says husband does the cooking. Also recommended to pt that she not drive--she fully understand why this is.     Vision Baseline Vision/History: Wears glasses Wears Glasses: At all times Patient Visual Report: Blurring of vision Additional Comments: Pt with left beating nystagmus when she looks left and also when going back to midline from left. When pt looks right no nystagmus noted as well as none noted when going back to midline from right. When pt gets dizzy she can make this subside by either looking right with her eyes or focusing on a target. We also discussed her looking at a target when she gets up and is moving to help keep dizziness at bay. I am getting her a patch to use only when blurring of vision is really impacting her being able to function and she is to only wear it 1 hour at a time per eye (alternating eyes if more than an hour)            Pertinent Vitals/Pain Pain Assessment: No/denies pain     Hand Dominance Right   Extremity/Trunk Assessment Upper Extremity Assessment Upper Extremity Assessment: Overall WFL for  tasks assessed   Lower Extremity Assessment Lower Extremity Assessment: Generalized weakness       Communication Communication Communication: No difficulties   Cognition Arousal/Alertness: Awake/alert Behavior During Therapy: WFL for tasks assessed/performed Overall Cognitive Status: Within Functional Limits for tasks assessed                                                Home Living Family/patient expects to be discharged to:: Private residence Living Arrangements: Spouse/significant other Available Help at Discharge: Family;Available PRN/intermittently Type of Home: House       Home Layout: One level     Bathroom Shower/Tub: Scientist, research (life sciences)Tub/shower unit    Bathroom Toilet: Standard     Home Equipment: None   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      Prior Functioning/Environment Level of Independence: Independent                          OT Goals(Current goals can be found in the care plan section) Acute Rehab OT Goals Patient Stated Goal: to get over this dizziness  OT Frequency:                AM-PAC PT "6 Clicks" Daily Activity     Outcome Measure Help from another person eating meals?: None Help from another person taking care of personal grooming?: A Little Help from another person toileting, which includes using toliet, bedpan, or urinal?: A Little Help from another person bathing (including washing, rinsing, drying)?: A Little Help from another person to put on and taking off regular upper body clothing?: A Little Help from another person to put on and taking off regular lower body clothing?: A Little 6 Click Score: 19   End of Session Equipment Utilized During Treatment: (none)  Activity Tolerance: Patient tolerated treatment well Patient left: in bed;with call bell/phone within reach  OT Visit Diagnosis: Unsteadiness on feet (R26.81)                Time: [id number] OT Time Calculation (min): 25 min Charges:  OT General Charges $OT Visit: 1 Visit OT Evaluation $OT Eval Moderate Complexity: 1 Mod OT Treatments $Self Care/Home Management : 8-22 mins Ignacia PalmaCathy Anaiya Wisinski, OTR/L [phone] 06/16/2017

## 2017-06-16 NOTE — Progress Notes (Signed)
PROGRESS NOTE    Michaela [name] M Michaela [name]  QIO:[id number] DOB: 25-Dec-1950 DOA: 06/14/2017 PCP: Michaela KoyanagiKim, Hannah R, DO   Brief Narrative: 66 year old female with history of hypertension, hyperlipidemia, migraine presented with nausea vomiting and dizziness consistent with possible vertigo.  MRI of the brain with acute sphenoid sinusitis   Assessment & Plan:   #Vertigo/dizziness: Patient having difficulty walking because of vertigo.  MRI of the brain consistent with sinusitis.  Patient does not have fever chills or sinus tenderness.  Also reported diplopia.  No nystagmus noticed today during examination.  Reportedly patient had nystagmus on admission as per patient.  No clinical improvement.  I consulted neurologist to evaluate the patient.  Discussed with Dr. Amada [name] -No orthostatic hypotension -Continue meclizine as needed and provide supportive care. -PT evaluation -TSH level acceptable.  #Acute sphenoid sinusitis, currently on oral amoxicillin.  # Hypokalemia: Replete potassium chloride.  Magnesium level acceptable.  Hold Microzide.  Nausea vomiting improved.  DVT prophylaxis: Lovenox subcutaneous Code Status: Full code Family Communication: Discussed with the patient's husband at bedside Disposition Plan: Currently admitted    Consultants:   Neurologist  Procedures: None Antimicrobials: None  Subjective: Seen and examined at bedside.  Still having vertigo and difficulty walking.  No nausea vomiting chest pain or shortness of breath.  No sinus tenderness on exam.  Objective: Vitals:   06/16/17 0850 06/16/17 1046 06/16/17 1050 06/16/17 1052  BP:  (!) 123/58 134/76 112/77  Pulse:  68 71 98  Resp:  18 16 18   Temp:  98.3 F (36.8 C)    TempSrc:  Oral    SpO2: 96% 98% 98% 98%  Weight:      Height:        Intake/Output Summary (Last 24 hours) at 06/16/2017 1237 Last data filed at 06/16/2017 0730 Gross per 24 hour  Intake 760 ml  Output -  Net 760 ml   Filed Weights   06/14/17 1858 06/15/17 0600 06/16/17 0506  Weight: 67.6 kg (149 lb) 65.9 kg (145 lb 4.5 oz) 70.2 kg (154 lb 12.2 oz)    Examination:  General exam: Appears calm and comfortable  , No sinus tenderness. Respiratory system: Clear to auscultation. Respiratory effort normal. No wheezing or crackle Cardiovascular system: S1 & S2 heard, RRR.  No pedal edema. Gastrointestinal system: Abdomen is nondistended, soft and nontender. Normal bowel sounds heard. Central nervous system: Alert and oriented. No focal neurological deficits. Extremities: Symmetric 5 x 5 power. Skin: No rashes, lesions or ulcers Psychiatry: Judgement and insight appear normal. Mood & affect appropriate.     Data Reviewed: I have personally reviewed following labs and imaging studies  CBC: Recent Labs  Lab 06/14/17 1901  WBC 7.4  HGB 13.8  HCT 39.5  MCV 85.9  PLT 193   Basic Metabolic Panel: Recent Labs  Lab 06/14/17 1901 06/15/17 1102 06/16/17 0822  NA 141 139 137  K 3.0* 3.1* 3.3*  CL 103 104 103  CO2 25 27 28   GLUCOSE 131* 113* 110*  BUN 18 19 16   CREATININE 0.60 0.72 0.64  CALCIUM 9.2 9.1 9.0  MG  --  2.1  --    GFR: Estimated Creatinine Clearance: 65.9 mL/min (by C-G formula based on SCr of 0.64 mg/dL). Liver Function Tests: Recent Labs  Lab 06/14/17 1901  AST 17  ALT 17  ALKPHOS 40  BILITOT 1.0  PROT 7.1  ALBUMIN 4.5   Recent Labs  Lab 06/14/17 1901  LIPASE 23   No results for input(s): AMMONIA  in the last 168 hours. Coagulation Profile: No results for input(s): INR, PROTIME in the last 168 hours. Cardiac Enzymes: Recent Labs  Lab 06/15/17 0729  TROPONINI <0.03   BNP (last 3 results) No results for input(s): PROBNP in the last 8760 hours. HbA1C: No results for input(s): HGBA1C in the last 72 hours. CBG: No results for input(s): GLUCAP in the last 168 hours. Lipid Profile: No results for input(s): CHOL, HDL, LDLCALC, TRIG, CHOLHDL, LDLDIRECT in the last 72 hours. Thyroid  Function Tests: Recent Labs    06/15/17 1434  TSH 1.313   Anemia Panel: No results for input(s): VITAMINB12, FOLATE, FERRITIN, TIBC, IRON, RETICCTPCT in the last 72 hours. Sepsis Labs: No results for input(s): PROCALCITON, LATICACIDVEN in the last 168 hours.  No results found for this or any previous visit (from the past 240 hour(s)).       Radiology Studies: Mr Brain Wo Contrast  Result Date: 06/14/2017 CLINICAL DATA:  Vertigo and nausea beginning last night. History of hypertension, hyperlipidemia and migraine. EXAM: MRI HEAD WITHOUT CONTRAST TECHNIQUE: Multiplanar, multiecho pulse sequences of the brain and surrounding structures were obtained without intravenous contrast. COMPARISON:  None. FINDINGS: Mildly motion degraded examination. BRAIN: No reduced diffusion to suggest acute ischemia. No susceptibility artifact to suggest hemorrhage. The ventricles and sulci are normal for patient's age. No suspicious parenchymal signal, mass or mass effect. No abnormal extra-axial fluid collections. VASCULAR: Normal major intracranial vascular flow voids present at skull base. SKULL AND UPPER CERVICAL SPINE: No abnormal sellar expansion. No suspicious calvarial bone marrow signal. Small sessile LEFT frontal calvarial exostosis. Craniocervical junction maintained. SINUSES/ORBITS: Mild less sphenoid sinus air-fluid level. The included ocular globes and orbital contents are non-suspicious. OTHER: None. IMPRESSION: 1. Negative mildly motion degraded noncontrast MRI of the head for age. 2. Mild acute sphenoid sinusitis. Electronically Signed   By: Awilda Metroourtnay  Bloomer M.D.   On: 06/14/2017 23:47        Scheduled Meds: . amoxicillin  500 mg Oral Q8H  . enoxaparin (LOVENOX) injection  40 mg Subcutaneous Q24H  . fluticasone  2 spray Each Nare Daily  . hydrochlorothiazide  12.5 mg Oral Daily  . [START ON 06/17/2017] potassium chloride  40 mEq Oral Daily   Continuous Infusions:   LOS: 1 day     Dron Michaela CollinsPrasad Bhandari, MD [facility] Pager [phone]  If 7PM-7AM, please contact night-coverage [email or web address] Password [email or web address] 06/16/2017, 12:37 PM

## 2017-06-16 NOTE — Evaluation (Signed)
Physical Therapy Evaluation Patient Details Name: Michaela [name] MRN: [id number] DOB: 03-31-1951 Today's Date: 06/16/2017   History of Present Illness  66 year old female with history of hypertension, hyperlipidemia, migraine presented with nausea, vomiting, and dizziness consistent with possible vertigo.  MRI of the brain with acute sphenoid sinusitis   Clinical Impression  Pt admitted with above diagnosis. Pt currently with functional limitations due to the deficits listed below (see PT Problem List).  Pt will benefit from skilled PT to increase their independence and safety with mobility to allow discharge to the venue listed below.    Pt with left beating horizontal nystagmus at rest as well as looking toward right and left laying supine.  In sitting upon ocular motor testing, nystagmus present with smooth pursuits however not present with looking and holding toward right visual field.  Pt reports dizziness and blurred vision with saccades and VOR.  Nystagmus and diplopia present with focusing on stationary object.  Pt assisted with ambulating in hallway with cues for looking toward right visual field to decrease dizziness, RW also provided and improved steadying assist.  Discussed evaluation with OT, who will see pt and attempt covering eye for improving diplopia (please refer to OT note for further information).  Pt provided with OP PT referral for neuro vestibular PT if symptoms persist.     Follow Up Recommendations Outpatient PT;Supervision for mobility/OOB    Equipment Recommendations  Rolling walker with 5" wheels    Recommendations for Other Services       Precautions / Restrictions Precautions Precautions: Fall Precaution Comments: diplopia      Mobility  Bed Mobility Overal bed mobility: Needs Assistance Bed Mobility: Supine to Sit     Supine to sit: Supervision     General bed mobility comments: supervision for safety due to dizziness and blurred  vision  Transfers Overall transfer level: Needs assistance   Transfers: Sit to/from Stand Sit to Stand: Min guard         General transfer comment: min/guard for safety, cues for hand placement with return to sitting (had RW)  Ambulation/Gait Ambulation/Gait assistance: Min assist Ambulation Distance (Feet): 160 Feet Assistive device: Rolling walker (2 wheeled) Gait Pattern/deviations: Step-through pattern;Decreased stride length Gait velocity: decreased   General Gait Details: very unsteady hesitant gait, verbal cues for looking toward right visual field (less dizzy, less blurred vision per pt), provided RW for returning to room and steadiness improved, pt reports 7/10 dizziness with mobility today (above 10/10 upon admission per pt)  Stairs            Wheelchair Mobility    Modified Rankin (Stroke Patients Only)       Balance Overall balance assessment: Needs assistance(reports no falls prior to admission)         Standing balance support: No upper extremity supported Standing balance-Leahy Scale: Fair                               Pertinent Vitals/Pain Pain Assessment: No/denies pain    Home Living Family/patient expects to be discharged to:: Private residence Living Arrangements: Spouse/significant other   Type of Home: House       Home Layout: One level Home Equipment: None      Prior Function Level of Independence: Independent               Hand Dominance        Extremity/Trunk Assessment  Lower Extremity Assessment Lower Extremity Assessment: Generalized weakness       Communication   Communication: No difficulties  Cognition Arousal/Alertness: Awake/alert Behavior During Therapy: WFL for tasks assessed/performed Overall Cognitive Status: Within Functional Limits for tasks assessed                                        General Comments      Exercises     Assessment/Plan    PT  Assessment Patient needs continued PT services  PT Problem List Decreased strength;Decreased activity tolerance;Decreased mobility;Decreased knowledge of use of DME(dizziness with mobility)       PT Treatment Interventions Gait training;DME instruction;Therapeutic activities;Therapeutic exercise;Patient/family education;Stair training;Functional mobility training;Balance training(compensation strategies)    PT Goals (Current goals can be found in the Care Plan section)  Acute Rehab PT Goals PT Goal Formulation: With patient/family Time For Goal Achievement: 06/23/17 Potential to Achieve Goals: Good    Frequency Min 3X/week   Barriers to discharge        Co-evaluation               AM-PAC PT "6 Clicks" Daily Activity  Outcome Measure Difficulty turning over in bed (including adjusting bedclothes, sheets and blankets)?: None Difficulty moving from lying on back to sitting on the side of the bed? : None Difficulty sitting down on and standing up from a chair with arms ([name].g., wheelchair, bedside commode, etc,.)?: A Little Help needed moving to and from a bed to chair (including a wheelchair)?: A Little Help needed walking in hospital room?: A Lot Help needed climbing 3-5 steps with a railing? : A Lot 6 Click Score: 18    End of Session Equipment Utilized During Treatment: Gait belt Activity Tolerance: Patient tolerated treatment well Patient left: in chair;with call bell/phone within reach;with family/visitor present   PT Visit Diagnosis: Other abnormalities of gait and mobility (R26.89)    Time: [id number] PT Time Calculation (min) (ACUTE ONLY): 24 min   Charges:   PT Evaluation $PT Eval Moderate Complexity: 1 Mod     PT G CodesZenovia Jarred:       Michaela [name], PT, DPT 06/16/2017 Pager: [phone]   Maida SaleLEMYRE,Michaela [name] 06/16/2017, 1:05 PM

## 2017-06-17 DIAGNOSIS — E876 Hypokalemia: Secondary | ICD-10-CM | POA: Diagnosis not present

## 2017-06-17 DIAGNOSIS — J0130 Acute sphenoidal sinusitis, unspecified: Secondary | ICD-10-CM | POA: Diagnosis not present

## 2017-06-17 DIAGNOSIS — R42 Dizziness and giddiness: Secondary | ICD-10-CM | POA: Diagnosis not present

## 2017-06-17 LAB — BASIC METABOLIC PANEL
Anion gap: 9 (ref 5–15)
BUN: 17 mg/dL (ref 6–20)
CHLORIDE: 104 mmol/L (ref 101–111)
CO2: 24 mmol/L (ref 22–32)
CREATININE: 0.52 mg/dL (ref 0.44–1.00)
Calcium: 9.4 mg/dL (ref 8.9–10.3)
GFR calc Af Amer: >60 mL/min (ref >60)
GFR calc non Af Amer: >60 mL/min (ref >60)
Glucose, Bld: 120 mg/dL — ABNORMAL HIGH (ref 65–99)
POTASSIUM: 3.7 mmol/L (ref 3.5–5.1)
Sodium: 137 mmol/L (ref 135–145)

## 2017-06-17 MED ORDER — PREDNISONE 10 MG PO TABS: 10.0000 mg | ORAL_TABLET | Freq: Every day | ORAL | 0 refills | Status: DC

## 2017-06-17 MED ORDER — MECLIZINE HCL 25 MG PO TABS: 25.0000 mg | ORAL_TABLET | Freq: Three times a day (TID) | ORAL | 0 refills | Status: DC | PRN

## 2017-06-17 MED ORDER — POTASSIUM CHLORIDE CRYS ER 20 MEQ PO TBCR: 20.0000 meq | EXTENDED_RELEASE_TABLET | Freq: Every day | ORAL | 0 refills | Status: DC

## 2017-06-17 MED ORDER — AMOXICILLIN 500 MG PO CAPS: 500.0000 mg | ORAL_CAPSULE | Freq: Three times a day (TID) | ORAL | 0 refills | Status: AC

## 2017-06-17 NOTE — Progress Notes (Signed)
Physical Therapy Treatment Patient Details Name: Michaela Cornfieldnn M Moffitt MRN: [id number] DOB: [birth year] Today's Date: 06/17/2017    History of Present Illness 66 year old female with history of hypertension, hyperlipidemia, migraine presented with nausea, vomiting, and dizziness consistent with possible vertigo.  MRI of the brain with acute sphenoid sinusitis; seen by neurology and thought to have labyrinthitis    PT Comments    Pt started on steroids last night and reports not sleeping however states dizziness/blurred vision improved today. Pt ambulated in hallway with RW and educated to use RW upon d/c home for safety and stability.  Also recommended pt f/u with OP Neuro PT (referral handout provided yesterday) if symptoms persist.  Pt feels ready to d/c home today.    Follow Up Recommendations  Outpatient PT;Supervision for mobility/OOB     Equipment Recommendations  Rolling walker with 5" wheels    Recommendations for Other Services       Precautions / Restrictions Precautions Precautions: Fall    Mobility  Bed Mobility Overal bed mobility: Modified Independent                Transfers Overall transfer level: Needs assistance Equipment used: Rolling walker (2 wheeled) Transfers: Sit to/from Stand Sit to Stand: Min guard         General transfer comment: cue to use RW, pt able to stand without holding however dizzy and slightly swaying when tying upper gown  Ambulation/Gait Ambulation/Gait assistance: Min guard Ambulation Distance (Feet): 400 Feet Assistive device: Rolling walker (2 wheeled) Gait Pattern/deviations: Step-through pattern;Decreased stride length     General Gait Details: improved gait with RW today, verbal cues for looking toward right visual field (less dizzy, less blurred vision per pt), pt reports dizziness improved to 5/10 vs 7/10 yesterday   Stairs            Wheelchair Mobility    Modified Rankin (Stroke Patients Only)        Balance                                            Cognition Arousal/Alertness: Awake/alert Behavior During Therapy: WFL for tasks assessed/performed Overall Cognitive Status: Within Functional Limits for tasks assessed                                        Exercises      General Comments        Pertinent Vitals/Pain Pain Assessment: No/denies pain    Home Living                      Prior Function            PT Goals (current goals can now be found in the care plan section) Progress towards PT goals: Progressing toward goals    Frequency    Min 3X/week      PT Plan Current plan remains appropriate    Co-evaluation              AM-PAC PT "6 Clicks" Daily Activity  Outcome Measure  Difficulty turning over in bed (including adjusting bedclothes, sheets and blankets)?: None Difficulty moving from lying on back to sitting on the side of the bed? : None Difficulty sitting down on and standing up from a  chair with arms (e.g., wheelchair, bedside commode, etc,.)?: A Little Help needed moving to and from a bed to chair (including a wheelchair)?: A Little Help needed walking in hospital room?: A Little Help needed climbing 3-5 steps with a railing? : A Little 6 Click Score: 20    End of Session   Activity Tolerance: Patient tolerated treatment well Patient left: with call bell/phone within reach;with family/visitor present;in bed   PT Visit Diagnosis: Other abnormalities of gait and mobility (R26.89)     Time: [id number] PT Time Calculation (min) (ACUTE ONLY): 12 min  Charges:  $Gait Training: 8-22 mins                    G Codes:       Zenovia JarredKati Josue Falconi, PT, DPT 06/17/2017 Pager: [phone] Maida SaleLEMYRE,KATHrine E 06/17/2017, 12:56 PM

## 2017-06-17 NOTE — Progress Notes (Signed)
Patient given discharge instructions, and verbalized an understanding of all discharge instructions.  Patient agrees with discharge plan, and is being discharged in stable medical condition.  Patient given transportation via wheelchair. 

## 2017-06-17 NOTE — Progress Notes (Addendum)
G-Codes for PT evaluation    06/16/17 1208  PT Time Calculation  PT Start Time (ACUTE ONLY) 1104  PT Stop Time (ACUTE ONLY) 1128  PT Time Calculation (min) (ACUTE ONLY) 24 min  PT G-Codes **NOT FOR INPATIENT CLASS**  Functional Assessment Tool Used AM-PAC 6 Clicks Basic Mobility;Clinical judgement  Functional Limitation Mobility: Walking and moving around  Mobility: Walking and Moving Around Current Status (J4782(G8978) CJ  Mobility: Walking and Moving Around Goal Status (N5621(G8979) CI  PT General Charges  $$ ACUTE PT VISIT 1 Visit  PT Evaluation  $PT Eval Moderate Complexity 1 Mod   Zenovia JarredKati Sheron Robin, PT, DPT 06/17/2017 Pager: [phone]

## 2017-06-17 NOTE — Progress Notes (Signed)
Date: June 17, 2017 Chart review for discharge needs:  DME: rolling walker through [facility] Patient has no questions concerning post hospital care.

## 2017-06-17 NOTE — Discharge Summary (Signed)
Physician Discharge Summary  Michaela [name] AOZ:[id number] DOB: [birth year] DOA: 06/14/2017  PCP: [name], Hannah R, DO  Admit date: 06/14/2017 Discharge date: 06/17/2017  Admitted From:home Disposition:home  Recommendations for Outpatient Follow-up:  1. Follow up with PCP in 1-2 weeks 2. Please obtain BMP/CBC in one week   Home Health:no Equipment/Devices:walker Discharge Condition:stable CODE STATUS:full code Diet recommendation:heart healthy  Brief/Interim Summary: 66 year old female with history of hypertension, hyperlipidemia, migraine presented with nausea vomiting and dizziness consistent with possible vertigo. MRI of the brain with acute sphenoid sinusitis.  Vertigo/dizziness likely acute labyrinthitis: MRI of the brain consistent with sinusitis however patient does not have fever chills or sinus tenderness.  Completing a 5 days course of amoxicillin.  Evaluated by neurologist recommended to start oral prednisone for total 10 days with tapering.  Patient reported feeling better.  She is able to walk with help of walker.  Denies headache, nausea vomiting, chest pain or shortness of breath.  She has no orthostatic hypotension.  Ordered meclizine as needed and a rolling walker on discharge.  TSH level acceptable.  I discussed with the patient not to drive until she was evaluated by her family doctor.  On oral potassium for management of hypokalemia.  Recommended to follow-up with PCP to repeat lab.  At this time patient is clinically stable to discharge home with outpatient follow-up.  Nausea vomiting improved.    Discharge Diagnoses:  Principal Problem:   Vertigo Active Problems:   Hypokalemia   Tachycardia   Acute non-recurrent sphenoidal sinusitis    Discharge Instructions  Discharge Instructions    Call MD for:  difficulty breathing, headache or visual disturbances   Complete by:  As directed    Call MD for:  extreme fatigue   Complete by:  As directed    Call MD  for:  hives   Complete by:  As directed    Call MD for:  persistant dizziness or light-headedness   Complete by:  As directed    Call MD for:  persistant nausea and vomiting   Complete by:  As directed    Call MD for:  severe uncontrolled pain   Complete by:  As directed    Call MD for:  temperature >100.4   Complete by:  As directed    Diet - low sodium heart healthy   Complete by:  As directed    Discharge instructions   Complete by:  As directed    Do not drive until your vertigo is resolved and evaluated by your family doctor.  Please check labs, BMP in a week.   Increase activity slowly   Complete by:  As directed      Allergies as of 06/17/2017      Reactions   Sulfonamide Derivatives    REACTION: Hives      Medication List    TAKE these medications   amoxicillin 500 MG capsule Commonly known as:  AMOXIL Take 1 capsule (500 mg total) every 8 (eight) hours for 2 days by mouth.   atorvastatin 40 MG tablet Commonly known as:  LIPITOR TAKE 1 TABLET (40 MG TOTAL) BY MOUTH 2 (TWO) TIMES A WEEK.   fluticasone 50 MCG/ACT nasal spray Commonly known as:  FLONASE SHAKE WELL BEFORE EACH USE AND INSTILL 1 TO 2 SPRAYS IN EACH NOSTRIL ONCE DAILY What changed:  See the new instructions.   hydrochlorothiazide 12.5 MG capsule Commonly known as:  MICROZIDE Take 1 capsule (12.5 mg total) by mouth daily.   meclizine  25 MG tablet Commonly known as:  ANTIVERT Take 1 tablet (25 mg total) 3 (three) times daily as needed by mouth for dizziness.   potassium chloride SA 20 MEQ tablet Commonly known as:  K-DUR,KLOR-CON Take 1 tablet (20 mEq total) daily by mouth. Start taking on:  06/18/2017   predniSONE 10 MG tablet Commonly known as:  DELTASONE Take 1 tablet (10 mg total) daily by mouth. Take 60 mg daily for 4 days then 50 mg for 1 days 40 mg for 1 day 30 mg for 1 day 20 mg for 1 day 10 mg for 1 day and then stop.   PROBIOTIC PO Take 1 tablet daily by mouth.   tretinoin  0.025 % cream Commonly known as:  RETIN-A APPLY ONCE DAILY TO FACE AS DIRECTED            Durable Medical Equipment  (From admission, onward)        Start     Ordered   06/17/17 0937  For home use only DME Walker rolling  [facility](Walkers)  Once    Question:  Patient needs a walker to treat with the following condition  Answer:  Vertigo   06/17/17 0937     Follow-up Information    [name], Hannah R, DO. Schedule an appointment as soon as possible for a visit today.   Specialty:  Family Medicine Contact information: [address]  [place] [address] [phone]          Allergies  Allergen Reactions  . Sulfonamide Derivatives     REACTION: Hives    Consultations: Neurologist  Procedures/Studies: MRI  Subjective: Seen and examined at bedside.  Dizziness is better and able to walk with help of walker.  No headache, nausea vomiting, chest pain, shortness of breath, diarrhea, constipation, dysuria or urgency.  Discharge Exam: Vitals:   06/16/17 2145 06/17/17 0533  BP: (!) 145/61 130/80  Pulse: 78 74  Resp: 18 18  Temp: 98 F (36.7 C) 98.1 F (36.7 C)  SpO2: 96% 96%   Vitals:   06/16/17 1052 06/16/17 1515 06/16/17 2145 06/17/17 0533  BP: 112/77 135/63 (!) 145/61 130/80  Pulse: 98 66 78 74  Resp: 18 18 18 18   Temp:  97.8 F (36.6 C) 98 F (36.7 C) 98.1 F (36.7 C)  TempSrc:  Oral Oral Oral  SpO2: 98% 100% 96% 96%  Weight:      Height:        General: Pt is alert, awake, not in acute distress Cardiovascular: RRR, S1/S2 +, no rubs, no gallops Respiratory: CTA bilaterally, no wheezing, no rhonchi Abdominal: Soft, NT, ND, bowel sounds + Extremities: no edema, no cyanosis    The results of significant diagnostics from this hospitalization (including imaging, microbiology, ancillary and laboratory) are listed below for reference.     Microbiology: No results found for this or any previous visit (from the past 240 hour(s)).   Labs: BNP (last 3  results) No results for input(s): BNP in the last 8760 hours. Basic Metabolic Panel: Recent Labs  Lab 06/14/17 1901 06/15/17 1102 06/16/17 0822 06/17/17 0549  NA 141 139 137 137  K 3.0* 3.1* 3.3* 3.7  CL 103 104 103 104  CO2 25 27 28 24   GLUCOSE 131* 113* 110* 120*  BUN 18 19 16 17   CREATININE 0.60 0.72 0.64 0.52  CALCIUM 9.2 9.1 9.0 9.4  MG  --  2.1  --   --    Liver Function Tests: Recent Labs  Lab 06/14/17 1901  AST 17  ALT 17  ALKPHOS 40  BILITOT 1.0  PROT 7.1  ALBUMIN 4.5   Recent Labs  Lab 06/14/17 1901  LIPASE 23   No results for input(s): AMMONIA in the last 168 hours. CBC: Recent Labs  Lab 06/14/17 1901  WBC 7.4  HGB 13.8  HCT 39.5  MCV 85.9  PLT 193   Cardiac Enzymes: Recent Labs  Lab 06/15/17 0729  TROPONINI <0.03   BNP: Invalid input(s): POCBNP CBG: No results for input(s): GLUCAP in the last 168 hours. D-Dimer No results for input(s): DDIMER in the last 72 hours. Hgb A1c No results for input(s): HGBA1C in the last 72 hours. Lipid Profile No results for input(s): CHOL, HDL, LDLCALC, TRIG, CHOLHDL, LDLDIRECT in the last 72 hours. Thyroid function studies Recent Labs    06/15/17 1434  TSH 1.313   Anemia work up No results for input(s): VITAMINB12, FOLATE, FERRITIN, TIBC, IRON, RETICCTPCT in the last 72 hours. Urinalysis    Component Value Date/Time   COLORURINE YELLOW 06/14/2017 1901   APPEARANCEUR CLEAR 06/14/2017 1901   LABSPEC 1.026 06/14/2017 1901   PHURINE 5.0 06/14/2017 1901   GLUCOSEU NEGATIVE 06/14/2017 1901   HGBUR NEGATIVE 06/14/2017 1901   HGBUR negative 03/29/2010 0759   BILIRUBINUR NEGATIVE 06/14/2017 1901   BILIRUBINUR n 10/06/2013 1032   KETONESUR 80 (A) 06/14/2017 1901   PROTEINUR NEGATIVE 06/14/2017 1901   UROBILINOGEN 0.2 10/06/2013 1032   UROBILINOGEN 0.2 03/29/2010 0759   NITRITE NEGATIVE 06/14/2017 1901   LEUKOCYTESUR NEGATIVE 06/14/2017 1901   Sepsis Labs Invalid input(s): PROCALCITONIN,  WBC,   LACTICIDVEN Microbiology No results found for this or any previous visit (from the past 240 hour(s)).   Time coordinating discharge: 30 minutes  SIGNED:   Maxie Barb, MD  [facility] 06/17/2017, 9:38 AM  If 7PM-7AM, please contact night-coverage [email or web address] Password [email or web address]

## 2017-06-18 ENCOUNTER — Telehealth: Payer: Self-pay | Admitting: *Deleted

## 2017-06-18 NOTE — Telephone Encounter (Signed)
Transition Care Management Follow-up Telephone Call  Per Discharge Summary:  Admit date: 06/14/2017 Discharge date: 06/17/2017  Admitted From:home Disposition:home  Recommendations for Outpatient Follow-up:  1. Follow up with PCP in 1-2 weeks 2. Please obtain BMP/CBC in one week   Home Health:no Equipment/Devices:walker Discharge Condition:stable CODE STATUS:full code Diet recommendation:heart healthy  --   How have you been since you were released from the hospital? "I'm still a little bit wobbly on my legs but I understand there's a walker coming today and I'm just going to walk inside and outside if I can and try to get [my balance] back." She is trying to be active and not lay in bed. She plans to go back to work on 11/26.   Do you understand why you were in the hospital? yes   Do you understand the discharge instructions? yes   Where were you discharged to? Home   Items Reviewed:  Medications reviewed: yes  Allergies reviewed: yes  Dietary changes reviewed: yes, none made per pt  Referrals reviewed: yes, patient would like to discuss [facility] w/ PCP   Functional Questionnaire:   Activities of Daily Living (ADLs):   She states they are independent in the following: ambulation, bathing and hygiene, feeding, continence, grooming, toileting and dressing States they require assistance with the following: none   Any transportation issues/concerns?: no   Any patient concerns? yes, would like to discuss neurorehab and if PCP thinks it is necessary   Confirmed importance and date/time of follow-up visits scheduled yes  Provider Appointment booked with Dr. Kriste BasqueHannah Kim 06/30/17 @ 11am  Confirmed with patient if condition begins to worsen call PCP or go to the ER.  Patient was given the office number and encouraged to call back with question or concerns.  : yes

## 2017-06-19 NOTE — Progress Notes (Signed)
   06/16/17 1352  OT G-codes **NOT FOR INPATIENT CLASS**  Functional Assessment Tool Used Clinical judgement  Functional Limitation Self care  Self Care Current Status [phone](G8987) CI  Self Care Goal Status ([phone](G8988) CI  Self Care Discharge Status [phone](G8989) CI  Ignacia PalmaCathy Matti Killingsworth, OTR/L [phone] 06/19/2017

## 2017-06-27 NOTE — Progress Notes (Signed)
HPI:  Michaela [name] is a pleasant 66 y.o. with a PMH significant for hypertension on a diuretic  here for a hospital follow up. See transitional care phone note in Epic. Per review of discharge documents and patient: Hospitalized 06/14/17 to 06/17/17 Primary admitting complaint(s): N/V/vertigo Primary admitting diagnosis (es) and treatment: sinusitus on MRI - amox, neuro consult, prednisone, walker, meclizine.  She reports that the neurologist told her she had labyrinthitis from the sinus infection. Other significant diagnosis (es) and treatment:hypokalemia - potassium replacement Follow up concerns per discharge document: recheck potassium Reports today: She is doing almost 100% better with no residual vertigo.  She occasionally feels mildly woozy, but thinks it is from the meclizine she continues to take.  She did not know she could stop this.   See a physical therapist about vestibular rehab, as the physical therapist in the hospital told her that this would be helpful in case this were to ever recur.  3 months 1 visit with therapist to learn maneuvers, in case her vertigo returns.  She has not been driving.  Husband is driving.  Reports she has felt great the last few days without vertigo and may start to drive again. Denies: Headache, fevers, dizziness, vertigo, chest pain, shortness of breath   ROS: See pertinent positives and negatives per HPI.  Past Medical History:  Diagnosis Date  . Allergy   . Arthritis    osteoarthritis  . Foot deformity    saw podiatrist remotely  . Heel spur   . Hyperlipidemia   . Hypertension   . Migraine     Past Surgical History:  Procedure Laterality Date  . no prior surgery      Family History  Problem Relation Age of Onset  . Heart disease Mother   . Stroke Mother   . Early death Father        accidental death - trauma  . Colon cancer Paternal Uncle [id number]  . Stomach cancer Neg Hx     Social History   Socioeconomic History  . Marital  status: Married    Spouse name: None  . Number of children: None  . Years of education: None  . Highest education level: None  Social Needs  . Financial resource strain: None  . Food insecurity - worry: None  . Food insecurity - inability: None  . Transportation needs - medical: None  . Transportation needs - non-medical: None  Occupational History  . Occupation: Runner, broadcasting/film/videoteacher  Tobacco Use  . Smoking status: Former Smoker    Last attempt to quit: 10/03/1981    Years since quitting: 35.7  . Smokeless tobacco: Never Used  . Tobacco comment: smoked remotely for 10 years, quit in 1983  Substance and Sexual Activity  . Alcohol use: Yes    Comment: 3 glasses about 4 days per week  . Drug use: No  . Sexual activity: Yes  Other Topics Concern  . 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  atorvastatin (LIPITOR) 40 MG tablet, TAKE 1 TABLET (40 MG TOTAL) BY MOUTH 2 (TWO) TIMES A WEEK., Disp:  26 tablet, Rfl: 4 .  fexofenadine (ALLEGRA) 180 MG tablet, Take 180 mg daily as needed by mouth for allergies or rhinitis., Disp: , Rfl:  .  fluticasone (FLONASE) 50 MCG/ACT nasal spray, SHAKE WELL BEFORE EACH USE AND INSTILL 1 TO 2 SPRAYS IN EACH NOSTRIL ONCE DAILY (Patient taking differently: SHAKE WELL BEFORE EACH USE AND INSTILL 1 TO 2 SPRAYS IN EACH NOSTRIL ONCE DAILY AS NEEDED FOR ALLERGIES), Disp: 16 g, Rfl: 3 .  hydrochlorothiazide (MICROZIDE) 12.5 MG capsule, Take 1 capsule (12.5 mg total) by mouth daily., Disp: 90 capsule, Rfl: 3 .  meclizine (ANTIVERT) 25 MG tablet, Take 1 tablet (25 mg total) 3 (three) times daily as needed by mouth for dizziness., Disp: 30 tablet, Rfl: 0 .  potassium chloride SA  (K-DUR,KLOR-CON) 20 MEQ tablet, Take 1 tablet (20 mEq total) daily by mouth., Disp: 30 tablet, Rfl: 0 .  Probiotic Product (PROBIOTIC PO), Take 1 tablet daily by mouth. , Disp: , Rfl:  .  tretinoin (RETIN-A) 0.025 % cream, APPLY ONCE DAILY TO FACE AS DIRECTED, Disp: 45 g, Rfl: 3  EXAM:  Vitals:   06/30/17 1055  BP: 136/80  Pulse: 76  Temp: 98.5 F (36.9 C)  SpO2: 98%    Body mass index is 25.99 kg/m.  GENERAL: vitals reviewed and listed above, alert, oriented, appears well hydrated and in no acute distress  HEENT: atraumatic, conjunttiva clear, no obvious abnormalities on inspection of external nose and ears  NECK: no obvious masses on inspection  LUNGS: clear to auscultation bilaterally, no wheezes, rales or rhonchi, good air movement  CV: HRRR, no peripheral edema  MS: moves all extremities without noticeable abnormality  PSYCH: pleasant and cooperative, no obvious depression or anxiety  ASSESSMENT AND PLAN:  Discussed the following assessment and plan:  Vertigo - Plan: Ambulatory referral to Physical Therapy  Hypokalemia - Plan: Basic metabolic panel  Acute non-recurrent sinusitis, unspecified location  -Labs to check on potassium, if normal will stop the potassium supplements she is taking and recheck in 1 week -She has persistently low potassium, we discussed changing her blood pressure medicine, or continuing potassium supplementation -Seems that her vertigo has resolved, she still wants to see a physical therapist for 1 session to learn maneuvers in case it were to recur, referral placed, though did discuss that it sounds like this was not positional vertigo according to the hospital evaluation -Not have any symptoms of sinus infection at this time or action needed her treatment of antibiotics and prednisone -Advise she can stop the meclizine and only take if needed -Advised not to drive with vertigo -Advise neurology evaluation outpatient if vertigo is  recurrent or persistent  -Patient advised to return or notify a doctor immediately if symptoms worsen or persist or new concerns arise.  Patient Instructions  BEFORE YOU LEAVE: -Lab -follow up: 3-4 months  If the potassium is normal on this check, you may stop the potassium supplement and schedule a lab visit in 1 week to recheck your potassium.  I sent the referral to the physical therapist per your request.  Do not drive with vertigo.  Please follow-up if symptoms return.    Kriste BasqueKIM, Gloriana Piltz R., DO

## 2017-06-30 ENCOUNTER — Ambulatory Visit: Payer: Medicare Other | Admitting: Family Medicine

## 2017-06-30 ENCOUNTER — Encounter: Payer: Self-pay | Admitting: Family Medicine

## 2017-06-30 VITALS — BP 136/80 | HR 76 | Temp 98.5°F | Ht 63.0 in | Wt 146.7 lb

## 2017-06-30 DIAGNOSIS — R42 Dizziness and giddiness: Secondary | ICD-10-CM | POA: Diagnosis not present

## 2017-06-30 DIAGNOSIS — E876 Hypokalemia: Secondary | ICD-10-CM

## 2017-06-30 DIAGNOSIS — J019 Acute sinusitis, unspecified: Secondary | ICD-10-CM | POA: Diagnosis not present

## 2017-06-30 LAB — BASIC METABOLIC PANEL
BUN: 15 mg/dL (ref 6–23)
CALCIUM: 9.5 mg/dL (ref 8.4–10.5)
CHLORIDE: 98 meq/L (ref 96–112)
CO2: 30 meq/L (ref 19–32)
Creatinine, Ser: 0.73 mg/dL (ref 0.40–1.20)
GFR: 84.77 mL/min (ref >60.00)
GLUCOSE: 87 mg/dL (ref 70–99)
Potassium: 3.3 mEq/L — ABNORMAL LOW (ref 3.5–5.1)
SODIUM: 138 meq/L (ref 135–145)

## 2017-06-30 NOTE — Patient Instructions (Signed)
BEFORE YOU LEAVE: -Lab -follow up: 3-4 months  If the potassium is normal on this check, you may stop the potassium supplement and schedule a lab visit in 1 week to recheck your potassium.  I sent the referral to the physical therapist per your request.  Do not drive with vertigo.  Please follow-up if symptoms return.

## 2017-07-01 ENCOUNTER — Encounter: Payer: Self-pay | Admitting: Family Medicine

## 2017-07-01 MED ORDER — LOSARTAN POTASSIUM 50 MG PO TABS: 50.0000 mg | ORAL_TABLET | Freq: Every day | ORAL | 3 refills | Status: DC

## 2017-07-01 NOTE — Addendum Note (Signed)
Addended by: Johnella MoloneyFUNDERBURK, Dontrel Smethers A on: 07/01/2017 11:12 AM   Modules accepted: Orders

## 2017-07-02 ENCOUNTER — Ambulatory Visit: Payer: Medicare Other

## 2017-07-16 NOTE — Progress Notes (Signed)
HPI:  Follow up HTN and hypokalemia: -stopped diuretic and started losartan 11/26 -reports: Doing well, rarely has any dizziness now except for with certain quick movements of the head first gets out of bed, she will be seeing the vestibular therapist about this -Reports she is tolerating the new blood pressure medicine well -denies: Chest Pain, shortness of breath, vertigo,  ROS: See pertinent positives and negatives per HPI.  Past Medical History:  Diagnosis Date  . Allergy   . Arthritis    osteoarthritis  . Foot deformity    saw podiatrist remotely  . Heel spur   . Hyperlipidemia   . Hypertension   . Migraine     Past Surgical History:  Procedure Laterality Date  . no prior surgery      Family History  Problem Relation Age of Onset  . Heart disease Mother   . Stroke Mother   . Early death Father        accidental death - trauma  . Colon cancer Paternal Uncle [id number]  . Stomach cancer Neg Hx     Social History   Socioeconomic History  . Marital status: Married    Spouse name: None  . Number of children: None  . Years of education: None  . Highest education level: None  Social Needs  . Financial resource strain: None  . Food insecurity - worry: None  . Food insecurity - inability: None  . Transportation needs - medical: None  . Transportation needs - non-medical: None  Occupational History  . Occupation: Runner, broadcasting/film/videoteacher  Tobacco Use  . Smoking status: Former Smoker    Last attempt to quit: 10/03/1981    Years since quitting: 35.8  . Smokeless tobacco: Never Used  . Tobacco comment: smoked remotely for 10 years, quit in 1983  Substance and Sexual Activity  . Alcohol use: Yes    Comment: 3 glasses about 4 days per week  . Drug use: No  . Sexual activity: Yes  Other Topics Concern  . None  Social History Narrative   Work or School: Runner, broadcasting/film/videoteacher - part time - [employer], business and computer classes      Home Situation: lives with husband       Spiritual Beliefs: none      Lifestyle: 3 times per week (walks 1.5 miles and then does weight); diet healthy              Current Outpatient Medications:  .  atorvastatin (LIPITOR) 40 MG tablet, TAKE 1 TABLET (40 MG TOTAL) BY MOUTH 2 (TWO) TIMES A WEEK., Disp: 26 tablet, Rfl: 4 .  fexofenadine (ALLEGRA) 180 MG tablet, Take 180 mg daily as needed by mouth for allergies or rhinitis., Disp: , Rfl:  .  fluticasone (FLONASE) 50 MCG/ACT nasal spray, SHAKE WELL BEFORE EACH USE AND INSTILL 1 TO 2 SPRAYS IN EACH NOSTRIL ONCE DAILY (Patient taking differently: SHAKE WELL BEFORE EACH USE AND INSTILL 1 TO 2 SPRAYS IN EACH NOSTRIL ONCE DAILY AS NEEDED FOR ALLERGIES), Disp: 16 g, Rfl: 3 .  losartan (COZAAR) 50 MG tablet, Take 1 tablet (50 mg total) by mouth daily., Disp: 30 tablet, Rfl: 3 .  Probiotic Product (PROBIOTIC PO), Take 1 tablet daily by mouth. , Disp: , Rfl:  .  tretinoin (RETIN-A) 0.025 % cream, APPLY ONCE DAILY TO FACE AS DIRECTED, Disp: 45 g, Rfl: 3  EXAM:  Vitals:   07/17/17 0919  BP: 116/80  Pulse: 67  Temp: 97.7 F (36.5 C)  Body mass index is 25.51 kg/m.  GENERAL: vitals reviewed and listed above, alert, oriented, appears well hydrated and in no acute distress  HEENT: atraumatic, conjunttiva clear, no obvious abnormalities on inspection of external nose and ears  NECK: no obvious masses on inspection  LUNGS: clear to auscultation bilaterally, no wheezes, rales or rhonchi, good air movement  CV: HRRR, no peripheral edema  MS: moves all extremities without noticeable abnormality  PSYCH: pleasant and cooperative, no obvious depression or anxiety  ASSESSMENT AND PLAN:  Discussed the following assessment and plan:  Essential hypertension - Plan: Basic metabolic panel  Hypokalemia - Plan: Basic metabolic panel  Dizziness  -The blood pressure is better on recheck at 116/78 -We will continue the losartan we will check her basic metabolic panel today to ensure her  potassium is good -she will be doing vestibular therapy, seems her symptoms have resolved the most part -Patient advised to return or notify a doctor immediately if symptoms worsen or persist or new concerns arise.  Patient Instructions  BEFORE YOU LEAVE: -lab -follow up: 3 months  See the vestibular therapist as planned.  Do not drive with vertigo.   We recommend the following healthy lifestyle for LIFE: 1) Small portions. But, make sure to get regular (at least 3 per day), healthy meals and small healthy snacks if needed.  2) Eat a healthy clean diet.   TRY TO EAT: -at least 5-7 servings of low sugar, colorful, and nutrient rich vegetables per day (not corn, potatoes or bananas.) -berries are the best choice if you wish to eat fruit (only eat small amounts if trying to reduce weight)  -lean meets (fish, white meat of chicken or [place]key) -vegan proteins for some meals - beans or tofu, whole grains, nuts and seeds -Replace bad fats with good fats - good fats include: fish, nuts and seeds, canola oil, olive oil -small amounts of low fat or non fat dairy -small amounts of100 % whole grains - check the lables -drink plenty of water  AVOID: -SUGAR, sweets, anything with added sugar, corn syrup or sweeteners - must read labels as even foods advertised as "healthy" often are loaded with sugar -if you must have a sweetener, small amounts of stevia may be best -sweetened beverages and artificially sweetened beverages -simple starches (rice, bread, potatoes, pasta, chips, etc - small amounts of 100% whole grains are ok) -red meat, pork, butter -fried foods, fast food, processed food, excessive dairy, eggs and coconut.  3)Get at least 150 minutes of sweaty aerobic exercise per week.  4)Reduce stress - consider counseling, meditation and relaxation to balance other aspects of your life.     Kriste BasqueKIM, Miner Koral R., DO

## 2017-07-17 ENCOUNTER — Encounter: Payer: Self-pay | Admitting: Family Medicine

## 2017-07-17 ENCOUNTER — Ambulatory Visit: Payer: Medicare Other | Admitting: Family Medicine

## 2017-07-17 VITALS — BP 116/80 | HR 67 | Temp 97.7°F | Ht 63.0 in | Wt 144.0 lb

## 2017-07-17 DIAGNOSIS — R42 Dizziness and giddiness: Secondary | ICD-10-CM | POA: Diagnosis not present

## 2017-07-17 DIAGNOSIS — I10 Essential (primary) hypertension: Secondary | ICD-10-CM | POA: Diagnosis not present

## 2017-07-17 DIAGNOSIS — E876 Hypokalemia: Secondary | ICD-10-CM | POA: Diagnosis not present

## 2017-07-17 LAB — BASIC METABOLIC PANEL
BUN: 12 mg/dL (ref 6–23)
CALCIUM: 9.2 mg/dL (ref 8.4–10.5)
CO2: 30 mEq/L (ref 19–32)
CREATININE: 0.67 mg/dL (ref 0.40–1.20)
Chloride: 105 mEq/L (ref 96–112)
GFR: 93.57 mL/min (ref >60.00)
Glucose, Bld: 76 mg/dL (ref 70–99)
Potassium: 4.1 mEq/L (ref 3.5–5.1)
SODIUM: 141 meq/L (ref 135–145)

## 2017-07-17 NOTE — Patient Instructions (Signed)
BEFORE YOU LEAVE: -lab -follow up: 3 months  See the vestibular therapist as planned.  Do not drive with vertigo.   We recommend the following healthy lifestyle for LIFE: 1) Small portions. But, make sure to get regular (at least 3 per day), healthy meals and small healthy snacks if needed.  2) Eat a healthy clean diet.   TRY TO EAT: -at least 5-7 servings of low sugar, colorful, and nutrient rich vegetables per day (not corn, potatoes or bananas.) -berries are the best choice if you wish to eat fruit (only eat small amounts if trying to reduce weight)  -lean meets (fish, white meat of chicken or [place]) -vegan proteins for some meals - beans or tofu, whole grains, nuts and seeds -Replace bad fats with good fats - good fats include: fish, nuts and seeds, canola oil, olive oil -small amounts of low fat or non fat dairy -small amounts of100 % whole grains - check the lables -drink plenty of water  AVOID: -SUGAR, sweets, anything with added sugar, corn syrup or sweeteners - must read labels as even foods advertised as "healthy" often are loaded with sugar -if you must have a sweetener, small amounts of stevia may be best -sweetened beverages and artificially sweetened beverages -simple starches (rice, bread, potatoes, pasta, chips, etc - small amounts of 100% whole grains are ok) -red meat, pork, butter -fried foods, fast food, processed food, excessive dairy, eggs and coconut.  3)Get at least 150 minutes of sweaty aerobic exercise per week.  4)Reduce stress - consider counseling, meditation and relaxation to balance other aspects of your life.

## 2017-07-21 ENCOUNTER — Ambulatory Visit: Payer: Medicare Other | Attending: Family Medicine

## 2017-07-21 DIAGNOSIS — R42 Dizziness and giddiness: Secondary | ICD-10-CM | POA: Insufficient documentation

## 2017-07-21 DIAGNOSIS — R2689 Other abnormalities of gait and mobility: Secondary | ICD-10-CM | POA: Diagnosis present

## 2017-07-21 NOTE — Therapy (Signed)
[facility] [address] [place], [place], [address] Phone: [phone]   Fax:  [phone]  Physical Therapy Evaluation  Patient Details  Name: Michaela Cornfieldnn M Laughery MRN: [id number] Date of Birth: 1950/08/23 Referring Provider: Dr. Selena BattenKim   Encounter Date: 07/21/2017  PT End of Session - 07/21/17 0855    Visit Number  1    Number of Visits  9    Date for PT Re-Evaluation  08/20/17    Authorization Type  [employer] Medicare: G-CODE AND PN EVERY 10TH VISIT.     PT Start Time  0805    PT Stop Time  [id number]    PT Time Calculation (min)  37 min    Equipment Utilized During Treatment  -- S for safety    Activity Tolerance  Patient tolerated treatment well    Behavior During Therapy  WFL for tasks assessed/performed       Past Medical History:  Diagnosis Date  . Allergy   . Arthritis    osteoarthritis  . Foot deformity    saw podiatrist remotely  . Heel spur   . Hyperlipidemia   . Hypertension   . Migraine     Past Surgical History:  Procedure Laterality Date  . no prior surgery      There were no vitals filed for this visit.   Subjective Assessment - 07/21/17 0810    Subjective  Pt reported dizziness began on 06/14/17, she woke up at 3AM to the room spinning. She also had N/V and vomited for 15 hours, she went to the ED and was admitted with vertigo. Pt was told she has labrynthitis. She's been having balance issues for the last year, and is not sure of the definitive diagnosis (she believes she has vestibular neuritis). She feels better overall but is still dizzy when she first gets OOB and when she moves her head quickly can provoke dizziness. Looking down and up can also cause dizziness. Pt reports dizziness at worst now (4/10), when in hospital dizziness was 10/10, at best: 0/10. Pt reports she no longer feels dizzy (except in the morning), its more lack of balance and fogginess. Pt experience 3 falls during the summer, while  bending forward and working in the world. Pt reported prior episodes of dizziness but not as severe. Large spaces (grocery store) are hard to negotiate for a little bit (<5 minutes). Pt denied loss of hearing, tinnitus, N/T, weakness, or pain.     Pertinent History  OA, HTN, HLD, hx of vertigo, hx of migraines which stopped 20 years ago when pt started BP medication    Patient Stated Goals  To know what to do if she's alone and dizziness occurs, and be as normal as possible doing every day things.     Currently in Pain?  No/denies         [facility] - 07/21/17 0816      Assessment   Medical Diagnosis  Vertigo    Referring Provider  Dr. Selena BattenKim    Onset Date/Surgical Date  06/14/17    Hand Dominance  Right    Prior Therapy  none      Precautions   Precautions  Fall      Restrictions   Weight Bearing Restrictions  No      Balance Screen   Has the patient fallen in the past 6 months  Yes    How many times?  3    Has the patient had  a decrease in activity level because of a fear of falling?   No    Is the patient reluctant to leave their home because of a fear of falling?   No      Home Nurse, mental health  Private residence    Living Arrangements  Other (Comment) husband    Available Help at Discharge  Family    Type of Home  House    Home Access  Level entry    Home Layout  One level    Home Equipment  [place] - single point;Walker - 2 wheels;Shower seat      Prior Function   Level of Independence  Independent    Vocation  Part time employment    Vocation Requirements  Standing on feet for hours at a time, she's retiring in a month    Leisure  Presenter, broadcasting, travel, read       Cognition   Overall Cognitive Status  Within Functional Limits for tasks assessed      Sensation   Additional Comments  Pt denied N/T.      Ambulation/Gait   Ambulation/Gait  Yes    Ambulation/Gait Assistance  5: Supervision    Ambulation/Gait Assistance Details  S to ensure safety.  Pt amb. in guarded manner and states she's been practicing in driveway, to not "wobble and weave".     Ambulation Distance (Feet)  100 Feet    Assistive device  None    Gait Pattern  Step-through pattern;Decreased trunk rotation a little guarded    Ambulation Surface  Level;Indoor    Gait velocity  3.63ft/sec.         Vestibular Assessment - 07/21/17 1610      Vestibular Assessment   General Observation  Pt reported she had nystagmus during smooth pursuits (L eye).       Symptom Behavior   Type of Dizziness  Comment foggy with some dizziness and imbalance    Frequency of Dizziness  Daily    Duration of Dizziness  < 1 minute    Aggravating Factors  Turning body quickly;Turning head quickly;Supine to sit    Relieving Factors  Rest;Closing eyes fouc       Occulomotor Exam   Occulomotor Alignment  Normal    Spontaneous  Absent    Gaze-induced  Absent    Smooth Pursuits  Intact    Saccades  Intact    Comment  Pt denied dizziness during testing. R HIT (+), L HIT (-)      Vestibulo-Occular Reflex   VOR 1 Head Only (x 1 viewing)  Pt reported 2/10 wooziness during VOR and had a hard time with keeping motion fluid.     VOR Cancellation  Normal Pt reported 2/10 dizziness but intact.       Visual Acuity   Static  8    Dynamic  1         Objective measurements completed on examination: See above findings.       Vestibular Treatment/Exercise - 07/21/17 0834      Vestibular Treatment/Exercise   Vestibular Treatment Provided  Gaze    Gaze Exercises  X1 Viewing Horizontal;X1 Viewing Vertical      X1 Viewing Horizontal   Foot Position  seated    Reps  2    Comments  20-30 sec. with cues and demo for technique. Please see pt instructions for HEP details.      X1 Viewing Vertical   Foot Position  seated  Reps  2    Comments  20-30 sec. with cues and demo for technique.             PT Education - 07/21/17 0854    Education provided  Yes    Education Details  PT  discussed outcome measures, PT POC, frequency, and duration. PT also provided pt with gaze stabilization.     Person(s) Educated  Patient    Methods  Explanation;Demonstration;Verbal cues;Handout    Comprehension  Returned demonstration;Verbalized understanding       PT Short Term Goals - 07/21/17 0909      PT SHORT TERM GOAL #1   Title  same as LTGs        PT Long Term Goals - 07/21/17 0909      PT LONG TERM GOAL #1   Title  Pt will be IND in HEP to improve dizziness and balance. TARGET DATE FOR ALL LTGS: 08/18/17    Status  New      PT LONG TERM GOAL #2   Title  Perform FGA and positional testing and write goals as indicated.     Status  New      PT LONG TERM GOAL #3   Title  Pt will report </=1/10 dizziness during all functional activities and position changes to improve safety during functional mobility.     Status  New      PT LONG TERM GOAL #4   Title  Pt will perform SVA vs. DVA with <3 line difference to decr. dizziness.     Status  New             Plan - 07/21/17 0856    Clinical Impression Statement  Pt is a pleasant 66y/o female presenting to OPPT neuro for vertigo. Pt was admitted in 06/2017 for labrynthitis. Pt's PMH is significant for the following: OA, HTN, HLD, hx of vertigo, hx of migraines which ceased 20 years ago once pt started BP medication. The following impairments were noted during exam: dizziness, guarded amb., pt reported impaired balance which PT will assess next session. Pt experienced concordant dizziness during VOR and SVA vs. DVA testing. Pt had a 7 line difference during SVA vs. DVA testing, which is a significant difference. Pt also had a positive R HIT test. PT did not formally assess balance or positional testing 2/2 time constraints and pt's likely has vestibular hypofunction. Pt would benefit from skilled PT to improve safety during functional mobility.     History and Personal Factors relevant to plan of care:  Pt is a Air cabin crewpart-time teacher  and likes to travel    Clinical Presentation  Stable    Clinical Presentation due to:  OA, HTN, HLD, hx of vertigo, migraines (hasn't had one in 20 years)    Clinical Decision Making  Moderate    Rehab Potential  Good    Clinical Impairments Affecting Rehab Potential  see above    PT Frequency  2x / week    PT Duration  4 weeks    PT Treatment/Interventions  ADLs/Self Care Home Management;Biofeedback;Canalith Repostioning;Therapeutic activities;Therapeutic exercise;Manual techniques;Vestibular;Functional mobility training;Stair training;Gait training;Patient/family education;DME Instruction;Neuromuscular re-education;Balance training    PT Next Visit Plan  Perform FGA and positional testing and write goals as indicated. Review x1 viewing HEP and progress as tolerated. Add balance activities to HEP.     Consulted and Agree with Plan of Care  Patient       Patient will benefit from skilled therapeutic intervention in order  to improve the following deficits and impairments:  Abnormal gait, Decreased balance, Dizziness, Decreased knowledge of use of DME  Visit Diagnosis: Dizziness and giddiness - Plan: PT plan of care cert/re-cert  Other abnormalities of gait and mobility - Plan: PT plan of care cert/re-cert  G-Codes - 08/09/2017 0911    Functional Assessment Tool Used (Outpatient Only)  SVA vs. DVA: 7 line difference, dizziness at worst: 4/10    Functional Limitation  Self care    Self Care Current Status (W0981)  At least 40 percent but less than 60 percent impaired, limited or restricted    Self Care Goal Status (X9147)  At least 1 percent but less than 20 percent impaired, limited or restricted        Problem List Patient Active Problem List   Diagnosis Date Noted  . Acute non-recurrent sphenoidal sinusitis   . Vertigo 06/15/2017  . Hypokalemia 06/15/2017  . Tachycardia 06/15/2017  . Osteoarthritis 10/26/2007  . Allergic rhinitis 05/05/2007  . Essential hypertension 04/21/2007   . Hyperlipemia 02/26/2007    Merl Bommarito L 08/09/17, 9:13 AM  [facility] [address] [place], [place], [address] Phone: [phone]   Fax:  [phone]  Name: NOHELY WHITEHORN MRN: [id number] Date of Birth: Jul 04, 1951   Zerita Boers, PT,DPT 08-09-2017 9:14 AM Phone: [phone] Fax: [phone]

## 2017-07-21 NOTE — Patient Instructions (Signed)
Gaze Stabilization: Tip Card  1.Target must remain in focus, not blurry, and appear stationary while head is in motion. 2.Perform exercises with small head movements (45 to either side of midline). 3.Increase speed of head motion so long as target is in focus. 4.If you wear eyeglasses, be sure you can see target through lens (therapist will give specific instructions for bifocal / progressive lenses). 5.These exercises may provoke dizziness or nausea. Work through these symptoms. If too dizzy, slow head movement slightly. Rest between each exercise. 6.Exercises demand concentration; avoid distractions. 7.For safety, perform standing exercises close to a counter, wall, corner, or next to someone.  Copyright  VHI. All rights reserved.    Gaze Stabilization: Sitting    Keeping eyes on target on wall 3  feet away, tilt head down 15-30 and move head side to side for __20-30__ seconds. Repeat while moving head up and down for _20-30___ seconds. Do __2__ sessions per day.  Copyright  VHI. All rights reserved.

## 2017-08-11 ENCOUNTER — Encounter: Payer: Self-pay | Admitting: Physical Therapy

## 2017-08-11 ENCOUNTER — Ambulatory Visit: Payer: Medicare Other | Attending: Family Medicine | Admitting: Physical Therapy

## 2017-08-11 DIAGNOSIS — R2689 Other abnormalities of gait and mobility: Secondary | ICD-10-CM | POA: Diagnosis present

## 2017-08-11 DIAGNOSIS — R42 Dizziness and giddiness: Secondary | ICD-10-CM | POA: Insufficient documentation

## 2017-08-11 NOTE — Therapy (Signed)
[facility] [address] [place], [place], [address] Phone: [phone]   Fax:  [phone]  Physical Therapy Treatment  Patient Details  Name: Michaela [name] MRN: [id number] Date of Birth: 29-Sep-1950 Referring Provider: Dr. Selena Batten   Encounter Date: 08/11/2017  PT End of Session - 08/11/17 0847    Visit Number  2    Number of Visits  9    Date for PT Re-Evaluation  08/20/17    PT Start Time  0803    PT Stop Time  0842    PT Time Calculation (min)  39 min    Activity Tolerance  Patient tolerated treatment well    Behavior During Therapy  [facility] for tasks assessed/performed       Past Medical History:  Diagnosis Date  . Allergy   . Arthritis    osteoarthritis  . Foot deformity    saw podiatrist remotely  . Heel spur   . Hyperlipidemia   . Hypertension   . Migraine     Past Surgical History:  Procedure Laterality Date  . no prior surgery      There were no vitals filed for this visit.  Subjective Assessment - 08/11/17 0805    Subjective  doing well; feels much better.  having trouble with looking up/down and rolling to right    Patient Stated Goals  To know what to do if she's alone and dizziness occurs, and be as normal as possible doing every day things.     Currently in Pain?  No/denies             Vestibular Assessment - 08/11/17 0807      Vestibular Assessment   General Observation  no dizziness or symptoms currently      Positional Testing   Dix-Hallpike  Dix-Hallpike Right;Dix-Hallpike Left    Horizontal Canal Testing  Horizontal Canal Right;Horizontal Canal Left      Dix-Hallpike Right   Dix-Hallpike Right Duration  30 sec    Dix-Hallpike Right Symptoms  Upbeat, right rotatory nystagmus      Dix-Hallpike Left   Dix-Hallpike Left Duration  none    Dix-Hallpike Left Symptoms  No nystagmus      Horizontal Canal Right   Horizontal Canal Right Duration  < 3 sec; c/o blurry vison; no  spinning; resolved with glasses on    Horizontal Canal Right Symptoms  Ageotrophic      Horizontal Canal Left   Horizontal Canal Left Duration  none    Horizontal Canal Left Symptoms  Normal              [facility] Adult PT Treatment/Exercise - 08/11/17 0842      Self-Care   Self-Care  Other Self-Care Comments    Other Self-Care Comments   educated on BPPV and after care      Vestibular Treatment/Exercise - 08/11/17 0831      Vestibular Treatment/Exercise   Vestibular Treatment Provided  Canalith Repositioning    Canalith Repositioning  Epley Manuever Right       EPLEY MANUEVER RIGHT   Number of Reps   2    Overall Response  Symptoms Resolved    Response Details   head shaking performed prior to 2nd rep      X1 Viewing Horizontal   Foot Position  seated; standing    Reps  2    Comments  30 sec each      X1 Viewing Vertical   Foot Position  seated; standing    Reps  2    Comments  30 sec each            PT Education - 08/11/17 0847    Education provided  Yes    Education Details  HEP    Person(s) Educated  Patient    Methods  Explanation;Demonstration;Handout    Comprehension  Verbalized understanding;Returned demonstration       PT Short Term Goals - 07/21/17 0909      PT SHORT TERM GOAL #1   Title  same as LTGs        PT Long Term Goals - 08/11/17 0853      PT LONG TERM GOAL #1   Title  Pt will be IND in HEP to improve dizziness and balance. TARGET DATE FOR ALL LTGS: 08/18/17    Status  On-going      PT LONG TERM GOAL #2   Title  Perform FGA and positional testing and write goals as indicated.     Status  On-going      PT LONG TERM GOAL #3   Title  Pt will report </=1/10 dizziness during all functional activities and position changes to improve safety during functional mobility.     Status  On-going      PT LONG TERM GOAL #4   Title  Pt will perform SVA vs. DVA with <3 line difference to decr. dizziness.     Status  On-going      PT LONG  TERM GOAL #5   Title  demonstrate negative positional testing    Status  New            Plan - 08/11/17 16100853    Clinical Impression Statement  Pt's positional assessment today indicated positive Rt pBPPV with possible multi canal involvement.  Ageotropic nystagmus noted with Rt horizontal roll but pt asymptomatic at this time.  Will continue to monitor and treat as needed.  Progressed gaze to standing today and advised to progress to standing on compliant surface as that becomes easy.  Pt tolerated session well with resolve of BPPV on 2nd rep of epley's.    PT Treatment/Interventions  ADLs/Self Care Home Management;Biofeedback;Canalith Repostioning;Therapeutic activities;Therapeutic exercise;Manual techniques;Vestibular;Functional mobility training;Stair training;Gait training;Patient/family education;DME Instruction;Neuromuscular re-education;Balance training    PT Next Visit Plan  reassess canals; Perform FGA;  Review x1 viewing HEP and progress as tolerated. Add balance activities to HEP.     Consulted and Agree with Plan of Care  Patient       Patient will benefit from skilled therapeutic intervention in order to improve the following deficits and impairments:  Abnormal gait, Decreased balance, Dizziness, Decreased knowledge of use of DME  Visit Diagnosis: Dizziness and giddiness  Other abnormalities of gait and mobility     Problem List Patient Active Problem List   Diagnosis Date Noted  . Acute non-recurrent sphenoidal sinusitis   . Vertigo 06/15/2017  . Hypokalemia 06/15/2017  . Tachycardia 06/15/2017  . Osteoarthritis 10/26/2007  . Allergic rhinitis 05/05/2007  . Essential hypertension 04/21/2007  . Hyperlipemia 02/26/2007      Clarita CraneStephanie F Brealyn Baril, PT, DPT 08/11/17 8:58 AM    [facility] [address] [place], [place], [address] Phone: [phone]   Fax:  [phone]  Name: Michaela [name] MRN:  [id number] Date of Birth: 24-Sep-1950

## 2017-08-11 NOTE — Patient Instructions (Signed)
Gaze Stabilization: Standing Feet Apart    Feet apart to together, keeping eyes on target on wall 3-5 feet away, move head side to side for __30-60__ seconds. Repeat while moving head up and down for _30-60___ seconds. Do _2-3___ sessions per day.  Add a pillow when you can perform the above without any symptoms or loss of balance.  Copyright  VHI. All rights reserved.   Gaze Stabilization: Tip Card  1.Target must remain in focus, not blurry, and appear stationary while head is in motion. 2.Perform exercises with small head movements (45 to either side of midline). 3.Increase speed of head motion so long as target is in focus. 4.If you wear eyeglasses, be sure you can see target through lens (therapist will give specific instructions for bifocal / progressive lenses). 5.These exercises may provoke dizziness or nausea. Work through these symptoms. If too dizzy, slow head movement slightly. Rest between each exercise. 6.Exercises demand concentration; avoid distractions. 7.For safety, perform standing exercises close to a counter, wall, corner, or next to someone.  Copyright  VHI. All rights reserved.

## 2017-08-14 ENCOUNTER — Encounter: Payer: Self-pay | Admitting: Family Medicine

## 2017-08-14 ENCOUNTER — Ambulatory Visit: Payer: Medicare Other | Admitting: Physical Therapy

## 2017-08-14 DIAGNOSIS — R2689 Other abnormalities of gait and mobility: Secondary | ICD-10-CM

## 2017-08-14 DIAGNOSIS — R42 Dizziness and giddiness: Secondary | ICD-10-CM | POA: Diagnosis not present

## 2017-08-14 NOTE — Therapy (Deleted)
[facility] [address] [place], [place], [address] Phone: [phone]   Fax:  [phone]  Physical Therapy Treatment  Patient Details  Name: Michaela [name] Crowell MRN: [id number] Date of Birth: 1951-05-09 Referring Provider: Dr. Selena BattenKim   Encounter Date: 08/14/2017    Past Medical History:  Diagnosis Date  . Allergy   . Arthritis    osteoarthritis  . Foot deformity    saw podiatrist remotely  . Heel spur   . Hyperlipidemia   . Hypertension   . Migraine     Past Surgical History:  Procedure Laterality Date  . no prior surgery      There were no vitals filed for this visit.           14.25 on LLE  8.75 on RLE                   PT Short Term Goals - 07/21/17 0909      PT SHORT TERM GOAL #1   Title  same as LTGs        PT Long Term Goals - 08/11/17 0853      PT LONG TERM GOAL #1   Title  Pt will be IND in HEP to improve dizziness and balance. TARGET DATE FOR ALL LTGS: 08/18/17    Status  On-going      PT LONG TERM GOAL #2   Title  Perform FGA and positional testing and write goals as indicated.     Status  On-going      PT LONG TERM GOAL #3   Title  Pt will report </=1/10 dizziness during all functional activities and position changes to improve safety during functional mobility.     Status  On-going      PT LONG TERM GOAL #4   Title  Pt will perform SVA vs. DVA with <3 line difference to decr. dizziness.     Status  On-going      PT LONG TERM GOAL #5   Title  demonstrate negative positional testing    Status  New              Patient will benefit from skilled therapeutic intervention in order to improve the following deficits and impairments:     Visit Diagnosis: No diagnosis found.     Problem List Patient Active Problem List   Diagnosis Date Noted  . Acute non-recurrent sphenoidal sinusitis   . Vertigo 06/15/2017  . Hypokalemia 06/15/2017  .  Tachycardia 06/15/2017  . Osteoarthritis 10/26/2007  . Allergic rhinitis 05/05/2007  . Essential hypertension 04/21/2007  . Hyperlipemia 02/26/2007    Kary KosDilday, Dawnmarie Breon Suzanne 08/14/2017, 9:13 AM  [facility] [address] [place], [place], [address] Phone: [phone]   Fax:  [phone]  Name: Michaela [name] Borba MRN: [id number] Date of Birth: 1951-05-09

## 2017-08-14 NOTE — Patient Instructions (Addendum)
Tandem Stance    Right foot in front of left, heel touching toe both feet "straight ahead". Stand on Foot Triangle of Support with both feet. Balance in this position _30__ seconds. Do with left foot in front of right.  (RIGHT FOOT IN BACK)      SINGLE LIMB STANCE    Stance: single leg on floor. Raise leg. Hold __10_ seconds. Repeat with other leg. __2_ reps per set, __1_ sets per day, _5__ days per week  Copyright  VHI. All rights reserved.

## 2017-08-15 ENCOUNTER — Encounter: Payer: Self-pay | Admitting: Physical Therapy

## 2017-08-15 NOTE — Therapy (Signed)
[facility] [address] [place], [place], [address] Phone: [phone]   Fax:  [phone]  Physical Therapy Treatment  Patient Details  Name: Michaela [name] MRN: [id number] Date of Birth: August 11, 1950 Referring Provider: Dr. Maudie Mercury   Encounter Date: 08/14/2017  PT End of Session - 08/15/17 1012    Visit Number  3    Number of Visits  9    Date for PT Re-Evaluation  08/20/17    Authorization Type  [employer] Medicare: G-CODE AND PN EVERY 10TH VISIT.     PT Start Time  [phone]    PT Stop Time  0930    PT Time Calculation (min)  43 min       Past Medical History:  Diagnosis Date  . Allergy   . Arthritis    osteoarthritis  . Foot deformity    saw podiatrist remotely  . Heel spur   . Hyperlipidemia   . Hypertension   . Migraine     Past Surgical History:  Procedure Laterality Date  . no prior surgery      There were no vitals filed for this visit.  Subjective Assessment - 08/15/17 1010    Subjective  pt states she is no longer having any of the spinning vertigo since the Epley was done on Monday - states she is doing much better    Pertinent History  OA, HTN, HLD, hx of vertigo, hx of migraines which stopped 20 years ago when pt started BP medication    Patient Stated Goals  To know what to do if she's alone and dizziness occurs, and be as normal as possible doing every day things.     Currently in Pain?  No/denies         Rt Dix-Hallpike test (-) with no nystagmus and no c/o vertigo; Lt Dix-Hallpike test (-) with no nystagmus and no c/o vertigo  Functional gait assessment 28/30    Positional testing (-) including sit to sidelying Rt and Lt and rolling onto Rt and Lt sides - no nystagmus and no c/o vertigo  Rt and Lt horizontal roll test (-) with no nystagmus and no c/o vertigo    SLS times:  RLE 8.75 secs:  LLE 14.25 secs                     PT Education - 08/15/17 1030    Education  provided  Yes    Education Details  tandem and SLS    Person(s) Educated  Patient    Methods  Explanation;Demonstration;Handout    Comprehension  Verbalized understanding;Returned demonstration       PT Short Term Goals - 07/21/17 0909      PT SHORT TERM GOAL #1   Title  same as LTGs        PT Long Term Goals - 08/15/17 1013      PT LONG TERM GOAL #1   Title  Pt will be IND in HEP to improve dizziness and balance. TARGET DATE FOR ALL LTGS: 08/18/17    Status  Achieved      PT LONG TERM GOAL #2   Title  Perform FGA and positional testing and write goals as indicated.     Baseline  FGA score 28/30 - no LTG warranted    Status  Deferred      PT LONG TERM GOAL #3   Title  Pt will report </=1/10 dizziness during all functional activities and position  changes to improve safety during functional mobility.     Baseline  2-3/10 intensity of vertigo reported on 08-14-17    Status  On-going      PT LONG TERM GOAL #4   Title  Pt will perform SVA vs. DVA with <3 line difference to decr. dizziness.     Baseline  4 line difference on 08-14-17    Status  On-going      PT LONG TERM GOAL #5   Title  demonstrate negative positional testing    Baseline  met 08-14-17    Status  Achieved            Plan - 08/15/17 1025    Clinical Impression Statement  Pt has (-) Rt Dix-Hallpike test with no nystagmus and no c/o vertigo - indicative of resolution of Rt BPPV;  FGA score 28/30:  pt continues to have decr. VOR as evidenced by DVA test 4 line difference, however, this is improved from 8 line difference at time of initial evaluation; pt progrtessing fwell - plans on returning to gym for exercise program    PT Frequency  2x / week    PT Duration  4 weeks    PT Treatment/Interventions  ADLs/Self Care Home Management;Biofeedback;Canalith Repostioning;Therapeutic activities;Therapeutic exercise;Manual techniques;Vestibular;Functional mobility training;Stair training;Gait training;Patient/family  education;DME Instruction;Neuromuscular re-education;Balance training    PT Next Visit Plan  pt to be placed on hold for 2 weeks - will reassess and D/C at that time if no occurrence of vertigo; check balance HEP    PT Home Exercise Plan  tandem and SLS; progressed x1 viewing to patterned background on 08-14-17    Consulted and Agree with Plan of Care  Patient       Patient will benefit from skilled therapeutic intervention in order to improve the following deficits and impairments:  Abnormal gait, Decreased balance, Dizziness, Decreased knowledge of use of DME  Visit Diagnosis: Dizziness and giddiness  Other abnormalities of gait and mobility     Problem List Patient Active Problem List   Diagnosis Date Noted  . Acute non-recurrent sphenoidal sinusitis   . Vertigo 06/15/2017  . Hypokalemia 06/15/2017  . Tachycardia 06/15/2017  . Osteoarthritis 10/26/2007  . Allergic rhinitis 05/05/2007  . Essential hypertension 04/21/2007  . Hyperlipemia 02/26/2007    Alda Lea, PT 08/15/2017, 10:30 AM  [facility] [address] [place], [place], [address] Phone: [phone]   Fax:  [phone]  Name: Michaela [name] MRN: [id number] Date of Birth: 03-Mar-1951

## 2017-09-11 ENCOUNTER — Ambulatory Visit: Payer: Medicare Other | Attending: Family Medicine | Admitting: Physical Therapy

## 2017-09-11 DIAGNOSIS — R2681 Unsteadiness on feet: Secondary | ICD-10-CM | POA: Diagnosis present

## 2017-09-11 DIAGNOSIS — H8111 Benign paroxysmal vertigo, right ear: Secondary | ICD-10-CM | POA: Diagnosis present

## 2017-09-11 NOTE — Patient Instructions (Signed)
Sit to Side-Lying    Sit on edge of bed. 1. Turn head 45 to right. 2. Maintain head position and lie down slowly on left side. Hold until symptoms subside. 3. Sit up slowly. Hold until symptoms subside. 4. Turn head 45 to left. 5. Maintain head position and lie down slowly on right side. Hold until symptoms subside. 6. Sit up slowly. Repeat sequence __5__ times per session. Do __3-5_ sessions per day.  LIE DOWN on RIGHT SIDE FIRST  Copyright  VHI. All rights reserved.

## 2017-09-12 ENCOUNTER — Encounter: Payer: Self-pay | Admitting: Physical Therapy

## 2017-09-12 NOTE — Therapy (Signed)
[facility] [address] [place], [place], [address] Phone: [phone]   Fax:  [phone]  Physical Therapy Treatment  Patient Details  Name: Michaela [name] MRN: [id number] Date of Birth: March 04, 1951 Referring Provider: Colin Benton, DO   Encounter Date: 09/11/2017  PT End of Session - 09/12/17 0749    Visit Number  4    Number of Visits  9    Date for PT Re-Evaluation  11/09/17    PT Start Time  0935    PT Stop Time  1607    PT Time Calculation (min)  39 min       Past Medical History:  Diagnosis Date  . Allergy   . Arthritis    osteoarthritis  . Foot deformity    saw podiatrist remotely  . Heel spur   . Hyperlipidemia   . Hypertension   . Migraine     Past Surgical History:  Procedure Laterality Date  . no prior surgery      There were no vitals filed for this visit.  Subjective Assessment - 09/12/17 0747    Subjective  Pt states she has been doing well - has not had very much vertigo but states that she had eye doctor appt yesterday and the doctor said she saw some nystagmus during the exam    Pertinent History  OA, HTN, HLD, hx of vertigo, hx of migraines which stopped 20 years ago when pt started BP medication    Patient Stated Goals  To know what to do if she's alone and dizziness occurs, and be as normal as possible doing every day things.     Currently in Pain?  No/denies         [facility] PT Assessment - 09/12/17 0001      Assessment   Medical Diagnosis  Vertigo    Referring Provider  Colin Benton, DO    Onset Date/Surgical Date  06/14/17    Hand Dominance  Right    Prior Therapy  none       (+) Rt Dix-Hallpike test with Rt rotary upbeating nystagmus indicative of Rt BPPV  (-) Lt Dix-Hallpike test with no nystagmus and no c/o vertigo   Epley maneuver performed 2 reps for Rt posterior canalithiasis   Self Care;   Discussed etiology of vertigo (handout from [facility] was given to pt at  previous PT session) Instructed in Brandt-Daroff exercises - pt verbalized and demonstrated understanding for HEP  Discussed symptoms of BPPV compared to labyrinthitis as pt was initially diagnosed with in Nov. 2018 Pt wishes to schedule follow up appt to have should BPPV re-occur             PT Education - 09/12/17 0748    Education provided  Yes    Education Details  habituation for BPPV - Rt posterior canal    Person(s) Educated  Patient    Methods  Explanation;Demonstration;Handout    Comprehension  Verbalized understanding;Returned demonstration       PT Short Term Goals - 07/21/17 0909      PT SHORT TERM GOAL #1   Title  same as LTGs        PT Long Term Goals - 09/12/17 0754      PT LONG TERM GOAL #1   Title  Pt will be IND in HEP to improve dizziness and balance. TARGET DATE FOR ALL LTGS: 08/18/17    Status  On-going    Target Date  10/16/17  PT LONG TERM GOAL #2   Title  Perform FGA and positional testing and write goals as indicated.     Baseline  FGA score 28/30 - no LTG warranted    Status  Deferred      PT LONG TERM GOAL #3   Title  Pt will report </=1/10 dizziness during all functional activities and position changes to improve safety during functional mobility.     Baseline  2-3/10 intensity of vertigo reported on 08-14-17; met 09-11-17    Status  Achieved      PT LONG TERM GOAL #4   Title  Pt will perform SVA vs. DVA with <3 line difference to decr. dizziness.     Baseline  4 line difference on 08-14-17    Status  Deferred      PT LONG TERM GOAL #5   Title  demonstrate negative positional testing    Baseline  (+) Rt Dix-Hallpike test indicative of Rt BPPV    Status  On-going    Target Date  10/16/17            Plan - 09/12/17 0751    Clinical Impression Statement  Pt had no nystagmus and no c/o vertigo on 2nd rep of Epley maneuver - Rt BPPV posterior canalithiasis appears to be resolved at this time;  pt wishes to schedule follow up  appt in a month for re-assessment should vertigo re-occur     Rehab Potential  Good    Clinical Impairments Affecting Rehab Potential  see above    PT Frequency  1x / week    PT Duration  4 weeks    PT Treatment/Interventions  ADLs/Self Care Home Management;Biofeedback;Canalith Repostioning;Therapeutic activities;Therapeutic exercise;Manual techniques;Vestibular;Functional mobility training;Stair training;Gait training;Patient/family education;DME Instruction;Neuromuscular re-education;Balance training    PT Next Visit Plan  recheck Rt BPPV - D/C if no vertigo    PT Home Exercise Plan  tandem and SLS; progressed x1 viewing to patterned background on 08-14-17    Consulted and Agree with Plan of Care  Patient       Patient will benefit from skilled therapeutic intervention in order to improve the following deficits and impairments:  Abnormal gait, Decreased balance, Dizziness, Decreased knowledge of use of DME  Visit Diagnosis: BPPV (benign paroxysmal positional vertigo), right - Plan: PT plan of care cert/re-cert  Unsteadiness on feet - Plan: PT plan of care cert/re-cert     Problem List Patient Active Problem List   Diagnosis Date Noted  . Acute non-recurrent sphenoidal sinusitis   . Vertigo 06/15/2017  . Hypokalemia 06/15/2017  . Tachycardia 06/15/2017  . Osteoarthritis 10/26/2007  . Allergic rhinitis 05/05/2007  . Essential hypertension 04/21/2007  . Hyperlipemia 02/26/2007    Alda Lea, PT 09/12/2017, 8:04 AM  [facility] [address] [place], [place], [address] Phone: [phone]   Fax:  [phone]  Name: Michaela [name] MRN: [id number] Date of Birth: 1950/12/11

## 2017-09-15 NOTE — Progress Notes (Signed)
HPI:  Here for CPE: She will be discussing her preventive care with Manuela Schwartz today at her annual wellness visit. Due for mammogram and labs  -Concerns and/or follow up today:   Michaela [name] is a pleasant 67 y.o. here for follow up. Chronic medical problems summarized below were reviewed for changes and stability and were updated as needed below. These issues and their treatment remain stable for the most part. Denies CP, SOB, DOE, treatment intolerance or new symptoms.  History of HTN and hypokalemia: -stopped diuretic and started losartan 11/26 -On beta-blocker in the past for a history of palpitations remotely, weaned off beta-blocker in 2018 due to lower heart rate  Obesity/HLD: -meds: atorvastatin -wt 163 02/2016 ---> today 11/18 149 --> 148 2/19 -she has been workong hard on eating healthier and is back to walking 2-3 miles a few times per week -she is pleased with her wt reduction  Seasonal allergies: -meds: flonase, allegra  History of vertigo: -Status post extensive evaluation in the hospital in 2018, diagnosed with labyrinthitis, underwent vestibular rehab -Reports is much much better, but her physical therapist diagnosed her with R BPPV and currently is treating - doing much better with rare symptoms if turns too quickly a certain way  -Diet: variety of foods, balance and well rounded, larger portion sizes -Exercise: no regular exercise -Taking folic acid, vitamin D or calcium: no -Diabetes and Dyslipidemia Screening: fasting for labs -Vaccines: see vaccine section EPIC -pap history: 07/2014 -normal -FDLMP: see nursing notes -sexual activity: yes, female partner, no new partners -wants STI testing (Hep C if born [birth year]): no -FH breast, colon or ovarian ca: see FH Last mammogram: had mammogram last week with [facility] Last colon cancer screening: Colonoscopy 07/2013, Dr. Maurene Capes Breast Ca Risk Assessment: see family history and pt history DEXA (>/= 65): 10/2016  -Alcohol,  Tobacco, drug use: see social history  Review of Systems - no fevers, unintentional weight loss, vision loss, hearing loss, chest pain, sob, hemoptysis, melena, hematochezia, hematuria, genital discharge, changing or concerning skin lesions, bleeding, bruising, loc, thoughts of self harm or SI  Past Medical History:  Diagnosis Date  . Allergy   . Arthritis    osteoarthritis  . Foot deformity    saw podiatrist remotely  . Heel spur   . Hyperlipidemia   . Hypertension   . Migraine     Past Surgical History:  Procedure Laterality Date  . no prior surgery      Family History  Problem Relation Age of Onset  . Heart disease Mother   . Stroke Mother   . Early death Father        accidental death - trauma  . Colon cancer Paternal Uncle 22  . Stomach cancer Neg Hx     Social History   Socioeconomic History  . Marital status: Married    Spouse name: None  . Number of children: None  . Years of education: None  . Highest education level: None  Social Needs  . Financial resource strain: None  . Food insecurity - worry: None  . Food insecurity - inability: None  . Transportation needs - medical: None  . Transportation needs - non-medical: None  Occupational History  . Occupation: Pharmacist, hospital  Tobacco Use  . Smoking status: Former Smoker    Last attempt to quit: 10/03/1981    Years since quitting: 35.9  . Smokeless tobacco: Never Used  . Tobacco comment: smoked remotely for 10 years, quit in 1983  Substance and Sexual  Activity  . Alcohol use: Yes    Comment: 3 glasses about 4 days per week  . Drug use: No  . Sexual activity: Yes  Other Topics Concern  . 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  atorvastatin (LIPITOR) 40 MG tablet, TAKE 1 TABLET (40 MG TOTAL) BY MOUTH 2 (TWO) TIMES A WEEK., Disp: 26 tablet, Rfl: 4 .  fexofenadine (ALLEGRA) 180 MG tablet, Take 180 mg daily as needed by mouth for allergies or rhinitis., Disp: , Rfl:  .  fluticasone (FLONASE) 50 MCG/ACT nasal spray, SHAKE WELL BEFORE EACH USE AND INSTILL 1 TO 2 SPRAYS IN EACH NOSTRIL ONCE DAILY (Patient taking differently: SHAKE WELL BEFORE EACH USE AND INSTILL 1 TO 2 SPRAYS IN EACH NOSTRIL ONCE DAILY AS NEEDED FOR ALLERGIES), Disp: 16 g, Rfl: 3 .  losartan (COZAAR) 50 MG tablet, Take 1 tablet (50 mg total) by mouth daily., Disp: 30 tablet, Rfl: 3 .  Probiotic Product (PROBIOTIC PO), Take 1 tablet daily by mouth. , Disp: , Rfl:  .  tretinoin (RETIN-A) 0.025 % cream, APPLY ONCE DAILY TO FACE AS DIRECTED, Disp: 45 g, Rfl: 3  EXAM:  Vitals:   09/16/17 0908  BP: 120/80  Pulse: 60  Temp: 97.6 F (36.4 C)    GENERAL: vitals reviewed and listed below, alert, oriented, appears well hydrated and in no acute distress  HEENT: head atraumatic, PERRLA, normal appearance of eyes, ears, nose and mouth. moist mucus membranes.  NECK: supple, no masses or lymphadenopathy  LUNGS: clear to auscultation bilaterally, no rales, rhonchi or wheeze  CV: HRRR, no peripheral edema or cyanosis, normal pedal pulses  ABDOMEN: bowel sounds normal, soft, non tender to palpation, no masses, no rebound or guarding  GU/BREAST: declined  SKIN: no rash or abnormal lesions - declined full skin exam, SK back of neck  MS: normal gait, moves all extremities normally  NEURO: normal gait, speech and thought processing grossly intact, muscle tone grossly intact throughout  PSYCH: normal affect, pleasant and cooperative  ASSESSMENT AND PLAN:  Discussed the following assessment and plan:  PREVENTIVE EXAM: -Discussed and advised all [place] preventive services health task force level A and B recommendations for age, sex and  risks. -Advised at least 150 minutes of exercise per week and a healthy diet with avoidance of (less then 1 serving per week) processed foods, white starches, red meat, fast foods and sweets and consisting of: * 5-9 servings of fresh fruits and vegetables (not corn or potatoes) *nuts and seeds, beans *olives and olive oil *lean meats such as fish and white chicken  *whole grains -labs, studies and vaccines per orders this encounter  2. Essential hypertension -continue current meds -bmp done recently, cbc today  3. Hyperlipidemia, unspecified hyperlipidemia type -fasting lipids today, lifestyle recs  4. Vertigo -Much better, did advise that she see neurology if symptoms persist or frequent, she agrees to let us know  5. Screening for depression -Negative   Patient advised to return to clinic immediately if symptoms worsen or persist or new concerns.  Patient Instructions  BEFORE YOU LEAVE: -  labs -AWV with Manuela Schwartz -follow up: 3-4 months   We recommend the following healthy lifestyle for LIFE: 1) Small portions. But, make sure to get regular (at least 3 per day), healthy meals and small healthy snacks if needed.  2) Eat a healthy clean diet.   TRY TO EAT: -at least 5-7 servings of low sugar, colorful, and nutrient rich vegetables per day (not corn, potatoes or bananas.) -berries are the best choice if you wish to eat fruit (only eat small amounts if trying to reduce weight)  -lean meets (fish, white meat of chicken or [place]) -vegan proteins for some meals - beans or tofu, whole grains, nuts and seeds -Replace bad fats with good fats - good fats include: fish, nuts and seeds, canola oil, olive oil -small amounts of low fat or non fat dairy -small amounts of100 % whole grains - check the lables -drink plenty of water  AVOID: -SUGAR, sweets, anything with added sugar, corn syrup or sweeteners - must read labels as even foods advertised as "healthy" often are loaded with  sugar -if you must have a sweetener, small amounts of stevia may be best -sweetened beverages and artificially sweetened beverages -simple starches (rice, bread, potatoes, pasta, chips, etc - small amounts of 100% whole grains are ok) -red meat, pork, butter -fried foods, fast food, processed food, excessive dairy, eggs and coconut.  3)Get at least 150 minutes of sweaty aerobic exercise per week.  4)Reduce stress - consider counseling, meditation and relaxation to balance other aspects of your life.   Preventive Care 46 Years and Older, Female Preventive care refers to lifestyle choices and visits with your health care provider that can promote health and wellness. What does preventive care include?  A yearly physical exam. This is also called an annual well check.  Dental exams once or twice a year.  Routine eye exams. Ask your health care provider how often you should have your eyes checked.  Personal lifestyle choices, including: ? Daily care of your teeth and gums. ? Regular physical activity. ? Eating a healthy diet. ? Avoiding tobacco and drug use. ? Limiting alcohol use. ? Practicing safe sex. ? Taking low-dose aspirin every day. ? Taking vitamin and mineral supplements as recommended by your health care provider. What happens during an annual well check? The services and screenings done by your health care provider during your annual well check will depend on your age, overall health, lifestyle risk factors, and family history of disease. Counseling Your health care provider may ask you questions about your:  Alcohol use.  Tobacco use.  Drug use.  Emotional well-being.  Home and relationship well-being.  Sexual activity.  Eating habits.  History of falls.  Memory and ability to understand (cognition).  Work and work Statistician.  Reproductive health.  Screening You may have the following tests or measurements:  Height, weight, and BMI.  Blood  pressure.  Lipid and cholesterol levels. These may be checked every 5 years, or more frequently if you are over 45 years old.  Skin check.  Lung cancer screening. You may have this screening every year starting at age 72 if you have a 30-pack-year history of smoking and currently smoke or have quit within the past 15 years.  Fecal occult blood test (FOBT) of the stool. You may have this test every year starting at age 20.  Flexible sigmoidoscopy or colonoscopy. You may have a sigmoidoscopy every 5 years or a colonoscopy every 10 years starting at age 70.  Hepatitis C blood test.  Hepatitis B blood test.  Sexually transmitted disease (STD) testing.  Diabetes screening. This is done by checking your blood sugar (glucose) after you have not eaten for a while (fasting). You may have this done every 1-3 years.  Bone density scan. This is done to screen for osteoporosis. You may have this done starting at age 70.  Mammogram. This may be done every 1-2 years. Talk to your health care provider about how often you should have regular mammograms.  Talk with your health care provider about your test results, treatment options, and if necessary, the need for more tests. Vaccines Your health care provider may recommend certain vaccines, such as:  Influenza vaccine. This is recommended every year.  Tetanus, diphtheria, and acellular pertussis (Tdap, Td) vaccine. You may need a Td booster every 10 years.  Varicella vaccine. You may need this if you have not been vaccinated.  Zoster vaccine. You may need this after age 20.  Measles, mumps, and rubella (MMR) vaccine. You may need at least one dose of MMR if you were born in 1957 or later. You may also need a second dose.  Pneumococcal 13-valent conjugate (PCV13) vaccine. One dose is recommended after age 73.  Pneumococcal polysaccharide (PPSV23) vaccine. One dose is recommended after age 70.  Meningococcal vaccine. You may need this if you  have certain conditions.  Hepatitis A vaccine. You may need this if you have certain conditions or if you travel or work in places where you may be exposed to hepatitis A.  Hepatitis B vaccine. You may need this if you have certain conditions or if you travel or work in places where you may be exposed to hepatitis B.  Haemophilus influenzae type b (Hib) vaccine. You may need this if you have certain conditions.  Talk to your health care provider about which screenings and vaccines you need and how often you need them. This information is not intended to replace advice given to you by your health care provider. Make sure you discuss any questions you have with your health care provider. Document Released: 08/18/2015 Document Revised: 04/10/2016 Document Reviewed: 05/23/2015 Elsevier Interactive Patient Education  2018 [employer].     No Follow-up on file.  Lucretia Kern, DO

## 2017-09-15 NOTE — Progress Notes (Signed)
Subjective:   Michaela [name] is a 67 y.o. female who presents for an Initial Medicare Annual Wellness Visit.  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 Maudie Mercury prior to AWV  Diet Chol/hdl ratio 3; hdl 66 Lost 30 lbs;  Gave up ETOH Does not eat seconds Stays away from whilte  Exercise Curtailed due to vertigo but working with rehab who encouraged her to start exercising again and she is doing so Started back on treadmill x 4 miles 3 miles x 1.5 h x 3 days a week   There are no preventive care reminders to display for this patient. Colonoscopy 04/2013 - due 04/2023 Bone density 10/2016 -1.2 Educated on calcium in food and osteoporosis foundation site Mammogram 08/2016;  Had mammogram last week  Educated regarding shingrix Is on the list at pharmacy   ETOH 3 to 4 glasses a week; Stopped drinking wine and only drinks periodically  Tobacco - quit 83'   Cardiac Risk Factors include: advanced age (>47mn, >>39women)     Objective:    Today's Vitals   09/16/17 1015  BP: 120/80  Pulse: 60  Weight: 148 lb (67.1 kg)  Height: 5' 2.5" (1.588 m)   Body mass index is 26.64 kg/m.  Advanced Directives 09/16/2017 07/21/2017 06/16/2017  Does Patient Have a Medical Advance Directive? Yes Yes Yes  [place]  Does patient want to make changes to medical advance directive? - No - Patient declined No - Patient declined  Copy of [place] Chart? - No - copy requested -    Current Medications (verified) Outpatient Encounter Medications as of 09/16/2017  Medication Sig  . atorvastatin (LIPITOR) 40 MG tablet TAKE 1 TABLET (40 MG TOTAL) BY MOUTH 2 (TWO) TIMES A WEEK.  . fexofenadine (ALLEGRA) 180 MG  tablet Take 180 mg daily as needed by mouth for allergies or rhinitis.  . fluticasone (FLONASE) 50 MCG/ACT nasal spray SHAKE WELL BEFORE EACH USE AND INSTILL 1 TO 2 SPRAYS IN EACH NOSTRIL ONCE DAILY (Patient taking differently: SHAKE WELL BEFORE EACH USE AND INSTILL 1 TO 2 SPRAYS IN EACH NOSTRIL ONCE DAILY AS NEEDED FOR ALLERGIES)  . losartan (COZAAR) 50 MG tablet Take 1 tablet (50 mg total) by mouth daily.  . Probiotic Product (PROBIOTIC PO) Take 1 tablet daily by mouth.   . tretinoin (RETIN-A) 0.025 % cream APPLY ONCE DAILY TO FACE AS DIRECTED   No facility-administered encounter medications on file as of 09/16/2017.     Allergies (verified) Sulfonamide derivatives   History: Past Medical History:  Diagnosis Date  . Allergy   . Arthritis    osteoarthritis  . Foot deformity    saw podiatrist remotely  . Heel spur   . Hyperlipidemia   . Hypertension   . Migraine    Past Surgical History:  Procedure Laterality Date  . no prior surgery     Family History  Problem Relation Age of Onset  . Heart disease Mother   . Stroke Mother   . Early death Father        accidental death - trauma  . Colon cancer Paternal Uncle [phone] . Stomach cancer Neg Hx    Social History  Socioeconomic History  . Marital status: Married    Spouse name: Not on file  . Number of children: Not on file  . Years of education: Not on file  . Highest education level: Not on file  Social Needs  . Financial resource strain: Not on file  . Food insecurity - worry: Not on file  . Food insecurity - inability: Not on file  . Transportation needs - medical: Not on file  . Transportation needs - non-medical: Not on file  Occupational History  . Occupation: Pharmacist, hospital  Tobacco Use  . Smoking status: Former Smoker    Last attempt to quit: 10/03/1981    Years since quitting: 35.9  . Smokeless tobacco: Never Used  . Tobacco comment: smoked remotely for 10 years, quit in 1983  Substance and Sexual Activity  .  Alcohol use: No    Frequency: Never    Comment: stopped drinking wine in an effort to lose weight; only drinks periodically  . Drug use: No  . Sexual activity: Yes  Other Topics Concern  . 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 N N  Vision? N N  Difficulty concentrating or making decisions? N N  Walking or climbing stairs? Y N  Comment use the handrail due to vertigo -  Dressing or bathing? N N  Doing errands, shopping? N N  Preparing Food and eating ? N -  Using the Toilet? N -  In the past six months, have you accidently leaked urine? N -  Do you have problems with loss of bowel control? N -  Comment constipated, takes citrocel -  Managing your Medications? N -  Managing your Finances? N -  Housekeeping or managing your Housekeeping? N -  Some recent data might be hidden     Immunizations and Health Maintenance Immunization History  Administered Date(s) Administered  . Influenza Split 04/15/2012  . Influenza Whole 08/05/2005, 05/10/2009, 05/31/2010  . Influenza, High Dose Seasonal PF 05/16/2017  . Influenza,inj,Quad PF,6+ Mos 04/12/2013, 05/04/2014, 05/11/2015, 05/23/2016  . Pneumococcal Conjugate-13 07/26/2014  . Pneumococcal Polysaccharide-23 08/05/2005, 09/05/2016  . Td 03/05/1997, 12/23/2005  . Tdap 02/15/2016  . Zoster 01/09/2012   There are no preventive care reminders to display for this patient.  Patient Care  Team: Lucretia Kern, DO as PCP - General (Family Medicine)  Indicate any recent Medical Services you may have received from other than [facility] providers in the past year (date may be approximate).     Assessment:   This is a routine wellness examination for Michaela [name].  Hearing/Vision screen Hearing Screening Comments: No hearing issues  Vision Screening Comments: Vision issues Went to her eye doctor last week Dr. Bing Plume  Dietary issues and exercise activities discussed: Current Exercise Habits: Home exercise routine, Type of exercise: treadmill, Time (Minutes): 60, Frequency (Times/Week): 3, Weekly Exercise (Minutes/Week): 180, Intensity:  Moderate  Goals    . Exercise 150 min/wk Moderate Activity     Will continue your diet and try Tai chi      Depression Screen PHQ 2/9 Scores 09/16/2017 09/16/2017 09/05/2016 06/08/2012  PHQ - 2 Score 0 0 0 0    Fall Risk Fall Risk  09/16/2017 09/16/2017 09/05/2016  Falls in the past year? No No No   Had episode of vertigo Now starting to exercise Plans to try tai chi at the [place] Exam 09/16/2017  Not completed: (No Data)     Ad8 score reviewed for issues:  Issues making decisions:  Less interest in hobbies / activities:  Repeats questions, stories (family complaining):  Trouble using ordinary gadgets (microwave, computer, phone):  Forgets the month or year:   Mismanaging finances:   Remembering appts:  Daily problems with thinking and/or memory: Ad8 score is=0 Mother's brother had alz She is engaged in classes at [employer]; Enjoys lifelong learning     Screening Tests Health Maintenance  Topic Date Due  . MAMMOGRAM  09/05/2018  . DEXA SCAN  10/11/2018  . COLONOSCOPY  04/17/2023  . TETANUS/TDAP  02/14/2026  . INFLUENZA VACCINE  Completed  . Hepatitis C Screening  Completed  . PNA vac Low Risk Adult  Completed    Qualifies for Shingles Vaccine? Is on a waiting list       Plan:      PCP Notes   Health Maintenance Educated regarding the shingrix and she is currently on a waiting list at the pharmacy  Mammogram completed last week and awaiting faxed report  Good exercise resumed on treadmill   Abnormal Screens  none  Referrals  none  Patient concerns; none  Nurse Concerns; As noted   Next PCP apt In 3 to 4 months - TBS Was seen today      I have personally reviewed and noted the following in the patient's chart:   . Medical and social history . Use of alcohol, tobacco or illicit drugs  . Current medications and supplements . Functional ability and status . Nutritional status . Physical activity . Advanced directives . List of other physicians . Hospitalizations, surgeries, and ER visits in previous 12 months . Vitals . Screenings to include cognitive, depression, and falls . Referrals and appointments  In addition, I have reviewed and discussed with patient certain preventive protocols, quality metrics, and best practice recommendations. A written personalized care plan for preventive services as well as general preventive health recommendations were provided to patient.     Wynetta Fines, RN   09/16/2017

## 2017-09-16 ENCOUNTER — Other Ambulatory Visit: Payer: Self-pay | Admitting: Family Medicine

## 2017-09-16 ENCOUNTER — Ambulatory Visit: Payer: Medicare Other

## 2017-09-16 ENCOUNTER — Encounter: Payer: Self-pay | Admitting: Family Medicine

## 2017-09-16 ENCOUNTER — Ambulatory Visit (INDEPENDENT_AMBULATORY_CARE_PROVIDER_SITE_OTHER): Payer: Medicare Other | Admitting: Family Medicine

## 2017-09-16 VITALS — BP 120/80 | HR 60 | Temp 97.6°F | Ht 62.5 in | Wt 148.8 lb

## 2017-09-16 VITALS — BP 120/80 | HR 60 | Ht 62.5 in | Wt 148.0 lb

## 2017-09-16 DIAGNOSIS — Z1331 Encounter for screening for depression: Secondary | ICD-10-CM

## 2017-09-16 DIAGNOSIS — I10 Essential (primary) hypertension: Secondary | ICD-10-CM | POA: Diagnosis not present

## 2017-09-16 DIAGNOSIS — E785 Hyperlipidemia, unspecified: Secondary | ICD-10-CM | POA: Diagnosis not present

## 2017-09-16 DIAGNOSIS — R42 Dizziness and giddiness: Secondary | ICD-10-CM

## 2017-09-16 DIAGNOSIS — Z0000 Encounter for general adult medical examination without abnormal findings: Secondary | ICD-10-CM

## 2017-09-16 LAB — CBC
HEMATOCRIT: 41.5 % (ref 36.0–46.0)
HEMOGLOBIN: 13.9 g/dL (ref 12.0–15.0)
MCHC: 33.6 g/dL (ref 30.0–36.0)
MCV: 89.4 fl (ref 78.0–100.0)
Platelets: 183 10*3/uL (ref 150.0–400.0)
RBC: 4.64 Mil/uL (ref 3.87–5.11)
RDW: 13.2 % (ref 11.5–15.5)
WBC: 4.2 10*3/uL (ref 4.0–10.5)

## 2017-09-16 LAB — HEMOGLOBIN A1C: HEMOGLOBIN A1C: 5 % (ref 4.6–6.5)

## 2017-09-16 LAB — LIPID PANEL
CHOL/HDL RATIO: 3
Cholesterol: 195 mg/dL (ref 0–200)
HDL: 70.7 mg/dL (ref >39.00)
LDL CALC: 109 mg/dL — AB (ref 0–99)
NonHDL: 124.54
TRIGLYCERIDES: 80 mg/dL (ref 0.0–149.0)
VLDL: 16 mg/dL (ref 0.0–40.0)

## 2017-09-16 NOTE — Progress Notes (Signed)
Michaela Kohlman R Skyrah Krupp, DO  

## 2017-09-16 NOTE — Patient Instructions (Addendum)
Michaela [name] , Thank you for taking time to come for your Medicare Wellness Visit. I appreciate your ongoing commitment to your health goals. Please review the following plan we discussed and let me know if I can assist you in the future.   Recommendations for Dexa Scan Female over the age of 34 Man age 67 or older If you broke a bone past the age of 67 Women menopausal age with risk factors (thin frame; smoker; hx of fx ) Post menopausal women under the age of 38 with risk factors A man age 88 to 67 with risk factors Other: Spine xray that is showing break of bone loss Back pain with possible break Height loss of 1/2 inch or more within one year Total loss in height of 1.5 inches from your original height  Calcium 1214m with Vit D 800u per day; more as directed by physician Strength building exercises discussed; can include walking; housework; small weights or stretch bands; silver sneakers if access to the  Please visit the osteoporosis [email or web address] for up to date recommendations    These are the goals we discussed: Goals    . Exercise 150 min/wk Moderate Activity     Will continue your diet and try Tai chi       This is a list of the screening recommended for you and due dates:  Health Maintenance  Topic Date Due  . Mammogram  09/05/2018  . DEXA scan (bone density measurement)  10/11/2018  . Colon Cancer Screening  04/17/2023  . Tetanus Vaccine  02/14/2026  . Flu Shot  Completed  .  Hepatitis C: One time screening is recommended by Center for Disease Control  (CDC) for  adults born from [birth year]through 1965.   Completed  . Pneumonia vaccines  Completed    Prevention of falls: Remove rugs or any tripping hazards in the home Use Non slip mats in bathtubs and showers Placing grab bars next to the toilet and or shower Placing handrails on both sides of the stair way Adding extra lighting in the home.   Personal safety issues reviewed:  1. Consider starting a community  watch program [facility].  Changes batteries is smoke detector and/or carbon monoxide detector  3.  If you have firearms; keep them in a safe place 4.  Wear protection when in the sun; Always wear sunscreen or a hat; It is good to have your doctor check your skin annually or review any new areas of concern 5. Driving safety; Keep in the right lane; stay 3 car lengths behind the car in front of you on the highway; look 3 times prior to pulling out; carry your cell phone everywhere you go!    Learn about the Yellow Dot program:  The program allows first responders at your emergency to have access to who your physician is, as well as your medications and medical conditions.  Citizens requesting the Yellow Dot Packages should contact Master Corporal KNunzio Cobbsat the [phone] the first week of the program and beginning the week after Easter citizens should contact their lScientist, physiological      Health Maintenance, Female Adopting a healthy lifestyle and getting preventive care can go a long way to promote health and wellness. Talk with your health care provider about what schedule of regular examinations is right for you. This is a good chance for you to check in with your provider about disease prevention and  staying healthy. In between checkups, there are plenty of things you can do on your own. Experts have done a lot of research about which lifestyle changes and preventive measures are most likely to keep you healthy. Ask your health care provider for more information. Weight and diet Eat a healthy diet  Be sure to include plenty of vegetables, fruits, low-fat dairy products, and lean protein.  Do not eat a lot of foods high in solid fats, added sugars, or salt.  Get regular exercise. This is one of the most important things you can do for your health. ? Most adults should exercise for at least 150 minutes each week.  The exercise should increase your heart rate and make you sweat (moderate-intensity exercise). ? Most adults should also do strengthening exercises at least twice a week. This is in addition to the moderate-intensity exercise.  Maintain a healthy weight  Body mass index (BMI) is a measurement that can be used to identify possible weight problems. It estimates body fat based on height and weight. Your health care provider can help determine your BMI and help you achieve or maintain a healthy weight.  For females 77 years of age and older: ? A BMI below 18.5 is considered underweight. ? A BMI of 18.5 to 24.9 is normal. ? A BMI of 25 to 29.9 is considered overweight. ? A BMI of 30 and above is considered obese.  Watch levels of cholesterol and blood lipids  You should start having your blood tested for lipids and cholesterol at 67 years of age, then have this test every 5 years.  You may need to have your cholesterol levels checked more often if: ? Your lipid or cholesterol levels are high. ? You are older than 67 years of age. ? You are at high risk for heart disease.  Cancer screening Lung Cancer  Lung cancer screening is recommended for adults 93-26 years old who are at high risk for lung cancer because of a history of smoking.  A yearly low-dose CT scan of the lungs is recommended for people who: ? Currently smoke. ? Have quit within the past 15 years. ? Have at least a 30-pack-year history of smoking. A pack year is smoking an average of one pack of cigarettes a day for 1 year.  Yearly screening should continue until it has been 15 years since you quit.  Yearly screening should stop if you develop a health problem that would prevent you from having lung cancer treatment.  Breast Cancer  Practice breast self-awareness. This means understanding how your breasts normally appear and feel.  It also means doing regular breast self-exams. Let your health care provider know about  any changes, no matter how small.  If you are in your 20s or 30s, you should have a clinical breast exam (CBE) by a health care provider every 1-3 years as part of a regular health exam.  If you are 73 or older, have a CBE every year. Also consider having a breast X-ray (mammogram) every year.  If you have a family history of breast cancer, talk to your health care provider about genetic screening.  If you are at high risk for breast cancer, talk to your health care provider about having an MRI and a mammogram every year.  Breast cancer gene (BRCA) assessment is recommended for women who have family members with BRCA-related cancers. BRCA-related cancers include: ? Breast. ? Ovarian. ? Tubal. ? Peritoneal cancers.  Results of the assessment will  determine the need for genetic counseling and BRCA1 and BRCA2 testing.  Cervical Cancer Your health care provider may recommend that you be screened regularly for cancer of the pelvic organs (ovaries, uterus, and vagina). This screening involves a pelvic examination, including checking for microscopic changes to the surface of your cervix (Pap test). You may be encouraged to have this screening done every 3 years, beginning at age 20.  For women ages 82-65, health care providers may recommend pelvic exams and Pap testing every 3 years, or they may recommend the Pap and pelvic exam, combined with testing for human papilloma virus (HPV), every 5 years. Some types of HPV increase your risk of cervical cancer. Testing for HPV may also be done on women of any age with unclear Pap test results.  Other health care providers may not recommend any screening for nonpregnant women who are considered low risk for pelvic cancer and who do not have symptoms. Ask your health care provider if a screening pelvic exam is right for you.  If you have had past treatment for cervical cancer or a condition that could lead to cancer, you need Pap tests and screening for  cancer for at least 20 years after your treatment. If Pap tests have been discontinued, your risk factors (such as having a new sexual partner) need to be reassessed to determine if screening should resume. Some women have medical problems that increase the chance of getting cervical cancer. In these cases, your health care provider may recommend more frequent screening and Pap tests.  Colorectal Cancer  This type of cancer can be detected and often prevented.  Routine colorectal cancer screening usually begins at 66 years of age and continues through 67 years of age.  Your health care provider may recommend screening at an earlier age if you have risk factors for colon cancer.  Your health care provider may also recommend using home test kits to check for hidden blood in the stool.  A small camera at the end of a tube can be used to examine your colon directly (sigmoidoscopy or colonoscopy). This is done to check for the earliest forms of colorectal cancer.  Routine screening usually begins at age 43.  Direct examination of the colon should be repeated every 5-10 years through 67 years of age. However, you may need to be screened more often if early forms of precancerous polyps or small growths are found.  Skin Cancer  Check your skin from head to toe regularly.  Tell your health care provider about any new moles or changes in moles, especially if there is a change in a mole's shape or color.  Also tell your health care provider if you have a mole that is larger than the size of a pencil eraser.  Always use sunscreen. Apply sunscreen liberally and repeatedly throughout the day.  Protect yourself by wearing long sleeves, pants, a wide-brimmed hat, and sunglasses whenever you are outside.  Heart disease, diabetes, and high blood pressure  High blood pressure causes heart disease and increases the risk of stroke. High blood pressure is more likely to develop in: ? People who have blood  pressure in the high end of the normal range (130-139/85-89 mm Hg). ? People who are overweight or obese. ? People who are African American.  If you are 98-29 years of age, have your blood pressure checked every 3-5 years. If you are 15 years of age or older, have your blood pressure checked every year. You should  have your blood pressure measured twice-once when you are at a hospital or clinic, and once when you are not at a hospital or clinic. Record the average of the two measurements. To check your blood pressure when you are not at a hospital or clinic, you can use: ? An automated blood pressure machine at a pharmacy. ? A home blood pressure monitor.  If you are between 79 years and 80 years old, ask your health care provider if you should take aspirin to prevent strokes.  Have regular diabetes screenings. This involves taking a blood sample to check your fasting blood sugar level. ? If you are at a normal weight and have a low risk for diabetes, have this test once every three years after 67 years of age. ? If you are overweight and have a high risk for diabetes, consider being tested at a younger age or more often. Preventing infection Hepatitis B  If you have a higher risk for hepatitis B, you should be screened for this virus. You are considered at high risk for hepatitis B if: ? You were born in a country where hepatitis B is common. Ask your health care provider which countries are considered high risk. ? Your parents were born in a high-risk country, and you have not been immunized against hepatitis B (hepatitis B vaccine). ? You have HIV or AIDS. ? You use needles to inject street drugs. ? You live with someone who has hepatitis B. ? You have had sex with someone who has hepatitis B. ? You get hemodialysis treatment. ? You take certain medicines for conditions, including cancer, organ transplantation, and autoimmune conditions.  Hepatitis C  Blood testing is recommended  for: ? Everyone born from [birth year] through 1965. ? Anyone with known risk factors for hepatitis C.  Sexually transmitted infections (STIs)  You should be screened for sexually transmitted infections (STIs) including gonorrhea and chlamydia if: ? You are sexually active and are younger than 67 years of age. ? You are older than 67 years of age and your health care provider tells you that you are at risk for this type of infection. ? Your sexual activity has changed since you were last screened and you are at an increased risk for chlamydia or gonorrhea. Ask your health care provider if you are at risk.  If you do not have HIV, but are at risk, it may be recommended that you take a prescription medicine daily to prevent HIV infection. This is called pre-exposure prophylaxis (PrEP). You are considered at risk if: ? You are sexually active and do not regularly use condoms or know the HIV status of your partner(s). ? You take drugs by injection. ? You are sexually active with a partner who has HIV.  Talk with your health care provider about whether you are at high risk of being infected with HIV. If you choose to begin PrEP, you should first be tested for HIV. You should then be tested every 3 months for as long as you are taking PrEP. Pregnancy  If you are premenopausal and you may become pregnant, ask your health care provider about preconception counseling.  If you may become pregnant, take 400 to 800 micrograms (mcg) of folic acid every day.  If you want to prevent pregnancy, talk to your health care provider about birth control (contraception). Osteoporosis and menopause  Osteoporosis is a disease in which the bones lose minerals and strength with aging. This can result in serious bone fractures.  Your risk for osteoporosis can be identified using a bone density scan.  If you are 66 years of age or older, or if you are at risk for osteoporosis and fractures, ask your health care provider if  you should be screened.  Ask your health care provider whether you should take a calcium or vitamin D supplement to lower your risk for osteoporosis.  Menopause may have certain physical symptoms and risks.  Hormone replacement therapy may reduce some of these symptoms and risks. Talk to your health care provider about whether hormone replacement therapy is right for you. Follow these instructions at home:  Schedule regular health, dental, and eye exams.  Stay current with your immunizations.  Do not use any tobacco products including cigarettes, chewing tobacco, or electronic cigarettes.  If you are pregnant, do not drink alcohol.  If you are breastfeeding, limit how much and how often you drink alcohol.  Limit alcohol intake to no more than 1 drink per day for nonpregnant women. One drink equals 12 ounces of beer, 5 ounces of wine, or 1 ounces of hard liquor.  Do not use street drugs.  Do not share needles.  Ask your health care provider for help if you need support or information about quitting drugs.  Tell your health care provider if you often feel depressed.  Tell your health care provider if you have ever been abused or do not feel safe at home. This information is not intended to replace advice given to you by your health care provider. Make sure you discuss any questions you have with your health care provider. Document Released: 02/04/2011 Document Revised: 12/28/2015 Document Reviewed: 04/25/2015 Elsevier Interactive Patient Education  2018 [place] in the Home Falls can cause injuries and can affect people from all age groups. There are many simple things that you can do to make your home safe and to help prevent falls. What can I do on the outside of my home?  Regularly repair the edges of walkways and driveways and fix any cracks.  Remove high doorway thresholds.  Trim any shrubbery on the main path into your home.  Use bright  outdoor lighting.  Clear walkways of debris and clutter, including tools and rocks.  Regularly check that handrails are securely fastened and in good repair. Both sides of any steps should have handrails.  Install guardrails along the edges of any raised decks or porches.  Have leaves, snow, and ice cleared regularly.  Use sand or salt on walkways during winter months.  In the garage, clean up any spills right away, including grease or oil spills. What can I do in the bathroom?  Use night lights.  Install grab bars by the toilet and in the tub and shower. Do not use towel bars as grab bars.  Use non-skid mats or decals on the floor of the tub or shower.  If you need to sit down while you are in the shower, use a plastic, non-slip stool.  Keep the floor dry. Immediately clean up any water that spills on the floor.  Remove soap buildup in the tub or shower on a regular basis.  Attach bath mats securely with double-sided non-slip rug tape.  Remove throw rugs and other tripping hazards from the floor. What can I do in the bedroom?  Use night lights.  Make sure that a bedside light is easy to reach.  Do not use oversized bedding that drapes onto the floor.  Have  a firm chair that has side arms to use for getting dressed.  Remove throw rugs and other tripping hazards from the floor. What can I do in the kitchen?  Clean up any spills right away.  Avoid walking on wet floors.  Place frequently used items in easy-to-reach places.  If you need to reach for something above you, use a sturdy step stool that has a grab bar.  Keep electrical cables out of the way.  Do not use floor polish or wax that makes floors slippery. If you have to use wax, make sure that it is non-skid floor wax.  Remove throw rugs and other tripping hazards from the floor. What can I do in the stairways?  Do not leave any items on the stairs.  Make sure that there are handrails on both sides of  the stairs. Fix handrails that are broken or loose. Make sure that handrails are as long as the stairways.  Check any carpeting to make sure that it is firmly attached to the stairs. Fix any carpet that is loose or worn.  Avoid having throw rugs at the top or bottom of stairways, or secure the rugs with carpet tape to prevent them from moving.  Make sure that you have a light switch at the top of the stairs and the bottom of the stairs. If you do not have them, have them installed. What are some other fall prevention tips?  Wear closed-toe shoes that fit well and support your feet. Wear shoes that have rubber soles or low heels.  When you use a stepladder, make sure that it is completely opened and that the sides are firmly locked. Have someone hold the ladder while you are using it. Do not climb a closed stepladder.  Add color or contrast paint or tape to grab bars and handrails in your home. Place contrasting color strips on the first and last steps.  Use mobility aids as needed, such as canes, walkers, scooters, and crutches.  Turn on lights if it is dark. Replace any light bulbs that burn out.  Set up furniture so that there are clear paths. Keep the furniture in the same spot.  Fix any uneven floor surfaces.  Choose a carpet design that does not hide the edge of steps of a stairway.  Be aware of any and all pets.  Review your medicines with your healthcare provider. Some medicines can cause dizziness or changes in blood pressure, which increase your risk of falling. Talk with your health care provider about other ways that you can decrease your risk of falls. This may include working with a physical therapist or trainer to improve your strength, balance, and endurance. This information is not intended to replace advice given to you by your health care provider. Make sure you discuss any questions you have with your health care provider. Document Released: 07/12/2002 Document  Revised: 12/19/2015 Document Reviewed: 08/26/2014 Elsevier Interactive Patient Education  [employer].

## 2017-09-16 NOTE — Patient Instructions (Signed)
BEFORE YOU LEAVE: -labs -AWV with Manuela Schwartz -follow up: 3-4 months   We recommend the following healthy lifestyle for LIFE: 1) Small portions. But, make sure to get regular (at least 3 per day), healthy meals and small healthy snacks if needed.  2) Eat a healthy clean diet.   TRY TO EAT: -at least 5-7 servings of low sugar, colorful, and nutrient rich vegetables per day (not corn, potatoes or bananas.) -berries are the best choice if you wish to eat fruit (only eat small amounts if trying to reduce weight)  -lean meets (fish, white meat of chicken or [place]) -vegan proteins for some meals - beans or tofu, whole grains, nuts and seeds -Replace bad fats with good fats - good fats include: fish, nuts and seeds, canola oil, olive oil -small amounts of low fat or non fat dairy -small amounts of100 % whole grains - check the lables -drink plenty of water  AVOID: -SUGAR, sweets, anything with added sugar, corn syrup or sweeteners - must read labels as even foods advertised as "healthy" often are loaded with sugar -if you must have a sweetener, small amounts of stevia may be best -sweetened beverages and artificially sweetened beverages -simple starches (rice, bread, potatoes, pasta, chips, etc - small amounts of 100% whole grains are ok) -red meat, pork, butter -fried foods, fast food, processed food, excessive dairy, eggs and coconut.  3)Get at least 150 minutes of sweaty aerobic exercise per week.  4)Reduce stress - consider counseling, meditation and relaxation to balance other aspects of your life.   Preventive Care 55 Years and Older, Female Preventive care refers to lifestyle choices and visits with your health care provider that can promote health and wellness. What does preventive care include?  A yearly physical exam. This is also called an annual well check.  Dental exams once or twice a year.  Routine eye exams. Ask your health care provider how often you should have your  eyes checked.  Personal lifestyle choices, including: ? Daily care of your teeth and gums. ? Regular physical activity. ? Eating a healthy diet. ? Avoiding tobacco and drug use. ? Limiting alcohol use. ? Practicing safe sex. ? Taking low-dose aspirin every day. ? Taking vitamin and mineral supplements as recommended by your health care provider. What happens during an annual well check? The services and screenings done by your health care provider during your annual well check will depend on your age, overall health, lifestyle risk factors, and family history of disease. Counseling Your health care provider may ask you questions about your:  Alcohol use.  Tobacco use.  Drug use.  Emotional well-being.  Home and relationship well-being.  Sexual activity.  Eating habits.  History of falls.  Memory and ability to understand (cognition).  Work and work Statistician.  Reproductive health.  Screening You may have the following tests or measurements:  Height, weight, and BMI.  Blood pressure.  Lipid and cholesterol levels. These may be checked every 5 years, or more frequently if you are over 50 years old.  Skin check.  Lung cancer screening. You may have this screening every year starting at age 71 if you have a 30-pack-year history of smoking and currently smoke or have quit within the past 15 years.  Fecal occult blood test (FOBT) of the stool. You may have this test every year starting at age 56.  Flexible sigmoidoscopy or colonoscopy. You may have a sigmoidoscopy every 5 years or a colonoscopy every 10 years starting at  age 60.  Hepatitis C blood test.  Hepatitis B blood test.  Sexually transmitted disease (STD) testing.  Diabetes screening. This is done by checking your blood sugar (glucose) after you have not eaten for a while (fasting). You may have this done every 1-3 years.  Bone density scan. This is done to screen for osteoporosis. You may have this  done starting at age 72.  Mammogram. This may be done every 1-2 years. Talk to your health care provider about how often you should have regular mammograms.  Talk with your health care provider about your test results, treatment options, and if necessary, the need for more tests. Vaccines Your health care provider may recommend certain vaccines, such as:  Influenza vaccine. This is recommended every year.  Tetanus, diphtheria, and acellular pertussis (Tdap, Td) vaccine. You may need a Td booster every 10 years.  Varicella vaccine. You may need this if you have not been vaccinated.  Zoster vaccine. You may need this after age 38.  Measles, mumps, and rubella (MMR) vaccine. You may need at least one dose of MMR if you were born in 1957 or later. You may also need a second dose.  Pneumococcal 13-valent conjugate (PCV13) vaccine. One dose is recommended after age 24.  Pneumococcal polysaccharide (PPSV23) vaccine. One dose is recommended after age 59.  Meningococcal vaccine. You may need this if you have certain conditions.  Hepatitis A vaccine. You may need this if you have certain conditions or if you travel or work in places where you may be exposed to hepatitis A.  Hepatitis B vaccine. You may need this if you have certain conditions or if you travel or work in places where you may be exposed to hepatitis B.  Haemophilus influenzae type b (Hib) vaccine. You may need this if you have certain conditions.  Talk to your health care provider about which screenings and vaccines you need and how often you need them. This information is not intended to replace advice given to you by your health care provider. Make sure you discuss any questions you have with your health care provider. Document Released: 08/18/2015 Document Revised: 04/10/2016 Document Reviewed: 05/23/2015 Elsevier Interactive Patient Education  [employer].

## 2017-09-25 ENCOUNTER — Other Ambulatory Visit: Payer: Self-pay | Admitting: Family Medicine

## 2017-09-25 NOTE — Telephone Encounter (Signed)
Rx done. 

## 2017-10-16 ENCOUNTER — Ambulatory Visit: Payer: Medicare Other | Attending: Family Medicine | Admitting: Physical Therapy

## 2017-10-16 ENCOUNTER — Encounter: Payer: Self-pay | Admitting: Physical Therapy

## 2017-10-16 DIAGNOSIS — H8111 Benign paroxysmal vertigo, right ear: Secondary | ICD-10-CM | POA: Diagnosis present

## 2017-10-16 DIAGNOSIS — R2681 Unsteadiness on feet: Secondary | ICD-10-CM | POA: Diagnosis present

## 2017-10-16 NOTE — Patient Instructions (Signed)
Feet Apart (Compliant Surface) Head Motion - Eyes Closed    Stand on compliant surface: __pillows______ with feet shoulder width apart. Close eyes and move head slowly, up and down. Repeat __1__ times per session. Do _1___ sessions per day.  Copyright  VHI. All rights reserved.  Feet Together (Compliant Surface) Varied Arm Positions - Eyes Open    With eyes open, standing on compliant surface: _pillows_______, feet together and arms out, look at a stationary object. Hold _60___ seconds. Repeat __1__ times per session. Do _1___ sessions per day.  Copyright  VHI. All rights reserved.

## 2017-10-17 ENCOUNTER — Encounter: Payer: Self-pay | Admitting: Physical Therapy

## 2017-10-17 NOTE — Therapy (Signed)
[facility] [address] [place], [place], [address] Phone: [phone]   Fax:  [phone]  Physical Therapy Treatment  Patient Details  Name: Michaela [name] MRN: [id number] Date of Birth: 1951-04-04 Referring Provider: Colin Benton, DO   Encounter Date: 10/16/2017  PT End of Session - 10/16/17 1445    Visit Number  5    Number of Visits  9    Date for PT Re-Evaluation  01/02/18    Authorization Type  UHC Medicare: G-CODE AND PN EVERY 10TH VISIT.     PT Start Time  0935    PT Stop Time  1014    PT Time Calculation (min)  39 min       Past Medical History:  Diagnosis Date  . Allergy   . Arthritis    osteoarthritis  . Foot deformity    saw podiatrist remotely  . Heel spur   . Hyperlipidemia   . Hypertension   . Migraine     Past Surgical History:  Procedure Laterality Date  . no prior surgery      There were no vitals filed for this visit.  Subjective Assessment - 10/17/17 1431    Subjective  Pt states she was doing well until she did yard work earlier in the week and had vertigo when she lied down on Tuesday night (10-14-17); pt states she feels "off" in the mornings when she gets up (dysequilibrium)    Pertinent History  OA, HTN, HLD, hx of vertigo, hx of migraines which stopped 20 years ago when pt started BP medication    Patient Stated Goals  To know what to do if she's alone and dizziness occurs, and be as normal as possible doing every day things.     Currently in Pain?  No/denies                Pt had (+) Rt Dix-Hallpike test with rotary upbeating nystagmus, indicative of Rt posterior canalithiasis  Epley maneuver performed for 2 reps - with husband present for instruction in how to perform maneuver for pt at home If needed No nystagmus noted nor c/o vertigo reported on 2nd rep of Epley's - indicative of resolution of Rt BPPV           Balance Exercises - 10/17/17 1431      Balance Exercises: Standing   Standing Eyes Opened  Narrow base of support (BOS);Wide (BOA);Head turns;Foam/compliant surface;5 reps    Standing Eyes Closed  Narrow base of support (BOS);Wide (BOA);Head turns;Foam/compliant surface;5 reps        PT Education - 10/16/17 1443    Education provided  Yes    Education Details  instructed pt's husband, Eduard Clos, in Epley maneuver for Rt BPPV for self-treatment at home prn;  added  standing on foam with EO and EC with feet apart/together for HEP    Person(s) Educated  Patient;Spouse    Methods  Explanation;Demonstration;Handout    Comprehension  Verbalized understanding;Returned demonstration       PT Short Term Goals - 07/21/17 0909      PT SHORT TERM GOAL #1   Title  same as LTGs        PT Long Term Goals - 10/17/17 1436      PT LONG TERM GOAL #1   Title  Pt will be IND in HEP to improve dizziness and balance.     Baseline  added balance on foam on 10-16-17    Status  On-going    Target Date  11/16/17      PT LONG TERM GOAL #2   Title  Perform FGA and positional testing and write goals as indicated.     Baseline  FGA score 28/30 - no LTG warranted    Status  Deferred      PT LONG TERM GOAL #3   Title  Pt will report no dizziness during all functional activities and position changes to improve safety during functional mobility.     Status  New    Target Date  11/16/17      PT LONG TERM GOAL #4   Title  Pt will perform SVA vs. DVA with <3 line difference to decr. dizziness.     Status  Deferred      PT LONG TERM GOAL #5   Title  demonstrate negative positional testing to indicate resolution of BPPV (re-occurrence on 10-14-17)    Baseline  (+) Rt Dix-Hallpike test indicative of Rt BPPV on 10-16-17    Time  4    Period  Weeks    Status  New    Target Date  11/16/17            Plan - 10/17/17 1432    Clinical Impression Statement  Pt had Rt rotary upbeating nystagmus in Rt Dix-Hallpike test position, indicative of Rt  BPPV; pt states she had not had any vertigo since previous PT rx session until Tues., 10-14-17 night; pt has signs and symptoms of Rt BPPV re-occurrence but symptoms appear to be resolved on 2nd rep of Epley maneuver today    Rehab Potential  Good    Clinical Impairments Affecting Rehab Potential  see above    PT Frequency  1x / week    PT Duration  4 weeks    PT Treatment/Interventions  ADLs/Self Care Home Management;Biofeedback;Canalith Repostioning;Therapeutic activities;Therapeutic exercise;Manual techniques;Vestibular;Functional mobility training;Stair training;Gait training;Patient/family education;DME Instruction;Neuromuscular re-education;Balance training    PT Next Visit Plan  recheck Rt BPPV and balance on foam exs. for HEP - D/C if no vertigo; ask about Tai Chi video    PT Home Exercise Plan  tandem and SLS; progressed x1 viewing to patterned background on 08-14-17    Consulted and Agree with Plan of Care  Patient;Family member/caregiver       Patient will benefit from skilled therapeutic intervention in order to improve the following deficits and impairments:  Abnormal gait, Decreased balance, Dizziness, Decreased knowledge of use of DME  Visit Diagnosis: BPPV (benign paroxysmal positional vertigo), right - Plan: PT plan of care cert/re-cert  Unsteadiness on feet - Plan: PT plan of care cert/re-cert     Problem List Patient Active Problem List   Diagnosis Date Noted  . Acute non-recurrent sphenoidal sinusitis   . Vertigo 06/15/2017  . Hypokalemia 06/15/2017  . Tachycardia 06/15/2017  . Osteoarthritis 10/26/2007  . Allergic rhinitis 05/05/2007  . Essential hypertension 04/21/2007  . Hyperlipemia 02/26/2007    [place], PT 10/17/2017, 2:42 PM  [facility] [address] [place], [place], [address] Phone: [phone]   Fax:  [phone]  Name: Michaela [name] MRN: [id number] Date of Birth:  [birth year]

## 2017-11-17 ENCOUNTER — Ambulatory Visit: Payer: Medicare Other | Attending: Family Medicine | Admitting: Physical Therapy

## 2017-11-17 ENCOUNTER — Encounter: Payer: Self-pay | Admitting: Physical Therapy

## 2017-11-17 DIAGNOSIS — R2681 Unsteadiness on feet: Secondary | ICD-10-CM | POA: Diagnosis present

## 2017-11-17 NOTE — Therapy (Signed)
[facility] [address] [place], [place], [address] Phone: [phone]   Fax:  [phone]  Physical Therapy Treatment  Patient Details  Name: Michaela [name] MRN: [id number] Date of Birth: Dec 17, 1950 Referring Provider: Colin Benton, DO   Encounter Date: 11/17/2017  PT End of Session - 11/17/17 2125    Visit Number  6    Number of Visits  9    Date for PT Re-Evaluation  01/02/18    Authorization Type  UHC Medicare: G-CODE AND PN EVERY 10TH VISIT.     PT Start Time  [phone]    PT Stop Time  1016    PT Time Calculation (min)  43 min       Past Medical History:  Diagnosis Date  . Allergy   . Arthritis    osteoarthritis  . Foot deformity    saw podiatrist remotely  . Heel spur   . Hyperlipidemia   . Hypertension   . Migraine     Past Surgical History:  Procedure Laterality Date  . no prior surgery      There were no vitals filed for this visit.  Subjective Assessment - 11/17/17 2114    Subjective  Pt states she has not had any episodes of vertigo since previous session     Pertinent History  OA, HTN, HLD, hx of vertigo, hx of migraines which stopped 20 years ago when pt started BP medication    Patient Stated Goals  To know what to do if she's alone and dizziness occurs, and be as normal as possible doing every day things.     Currently in Pain?  No/denies         Sensory Organization Test; Composite score 50/100 with N= 70/100  Condition 1 - all 3 trials WNL's Condition 2 - all 3 trials WNL's Condition 3 - all 3 trials WNL's Condition 4 - trial 1 below N;  Trials 2 and 3 WNL's Condition 5 - FALL on all 3 trials Condition 6 - FALL on all 3 trials   Somatosensory and visual inputs WNL's Vestibular input minimal to no input per SOT     Pt performed balance on foam exercises - EO and EC with feet apart and feet together   Static visual acuity - line 9 Dynamic visual acuity  - line 5 with pt able  to read 2 letters in line 6 (4 line difference)              Balance Exercises - 11/17/17 2119      Balance Exercises: Standing   Standing Eyes Opened  Narrow base of support (BOS);Wide (BOA);Head turns;Foam/compliant surface;5 reps    Standing Eyes Closed  Narrow base of support (BOS);Wide (BOA);Head turns;Foam/compliant surface;5 reps    Tandem Stance  Eyes open;1 rep;15 secs    SLS  Eyes open;1 rep;10 secs        PT Education - 11/17/17 2123    Education provided  Yes    Education Details  Reviewed standing on foam:  x1 viewing exercise  - plain x 60 secs and progress to patterned background     Person(s) Educated  Patient;Spouse    Methods  Explanation;Demonstration    Comprehension  Verbalized understanding;Returned demonstration       PT Short Term Goals - 07/21/17 0909      PT SHORT TERM GOAL #1   Title  same as LTGs        PT Long  Term Goals - 10/17/17 1436      PT LONG TERM GOAL #1   Title  Pt will be IND in HEP to improve dizziness and balance.     Baseline  added balance on foam on 10-16-17    Status  On-going    Target Date  11/16/17      PT LONG TERM GOAL #2   Title  Perform FGA and positional testing and write goals as indicated.     Baseline  FGA score 28/30 - no LTG warranted    Status  Deferred      PT LONG TERM GOAL #3   Title  Pt will report no dizziness during all functional activities and position changes to improve safety during functional mobility.     Status  New    Target Date  11/16/17      PT LONG TERM GOAL #4   Title  Pt will perform SVA vs. DVA with <3 line difference to decr. dizziness.     Status  Deferred      PT LONG TERM GOAL #5   Title  demonstrate negative positional testing to indicate resolution of BPPV (re-occurrence on 10-14-17)    Baseline  (+) Rt Dix-Hallpike test indicative of Rt BPPV on 10-16-17    Time  4    Period  Weeks    Status  New    Target Date  11/16/17            Plan - 11/17/17 2125     Clinical Impression Statement  Pt has vestibular hypofunction with pt unable to maintain balance on conditions 5 and 6;  minimal to no vestibular input in maintaining balance per SOT    Rehab Potential  Good    PT Frequency  1x / week    PT Duration  4 weeks    PT Treatment/Interventions  ADLs/Self Care Home Management;Biofeedback;Canalith Repostioning;Therapeutic activities;Therapeutic exercise;Manual techniques;Vestibular;Functional mobility training;Stair training;Gait training;Patient/family education;DME Instruction;Neuromuscular re-education;Balance training    PT Next Visit Plan  recheck  balance on foam exs. for HEP - D/C if no vertigo; ask about Tai Chi video    PT Home Exercise Plan  tandem and SLS; progressed x1 viewing to patterned background on 08-14-17    Consulted and Agree with Plan of Care  Patient;Family member/caregiver       Patient will benefit from skilled therapeutic intervention in order to improve the following deficits and impairments:  Abnormal gait, Decreased balance, Dizziness, Decreased knowledge of use of DME  Visit Diagnosis: Unsteadiness on feet     Problem List Patient Active Problem List   Diagnosis Date Noted  . Acute non-recurrent sphenoidal sinusitis   . Vertigo 06/15/2017  . Hypokalemia 06/15/2017  . Tachycardia 06/15/2017  . Osteoarthritis 10/26/2007  . Allergic rhinitis 05/05/2007  . Essential hypertension 04/21/2007  . Hyperlipemia 02/26/2007    Alda Lea, PT 11/17/2017, 9:32 PM  [facility] [address] [place], [place], [address] Phone: [phone]   Fax:  [phone]  Name: BERYLE BAGSBY MRN: [id number] Date of Birth: Jul 03, 1951

## 2017-12-16 ENCOUNTER — Ambulatory Visit: Payer: Medicare Other | Attending: Family Medicine | Admitting: Physical Therapy

## 2017-12-16 DIAGNOSIS — R2689 Other abnormalities of gait and mobility: Secondary | ICD-10-CM | POA: Diagnosis present

## 2017-12-16 DIAGNOSIS — R2681 Unsteadiness on feet: Secondary | ICD-10-CM | POA: Diagnosis present

## 2017-12-16 NOTE — Patient Instructions (Signed)
  VESTIBULAR Exs.:    1) Balance on Foam - 4 positions - approx. 30 secs  2) Walk with head turns  3) Walk with eyes closed   4) Walk backwards - eyes open - or can walk in the dark (in hallway)   BALANCE:  1) Single limb stance - -try for at least 10 secs  2) Tandem (heel to toe) - 30 secs - do each position  3)  Alternate tap ups at the steps - 1st step, 2nd step - VERY SLOWLY to increase difficulty

## 2017-12-17 ENCOUNTER — Encounter: Payer: Self-pay | Admitting: Physical Therapy

## 2017-12-17 NOTE — Therapy (Signed)
[facility] [address] [place], [place], [address] Phone: [phone]   Fax:  [phone]  Physical Therapy Treatment  Patient Details  Name: Michaela [name] MRN: [id number] Date of Birth: Sep 11, 1950 Referring Provider: Colin Benton, DO   Encounter Date: 12/16/2017  PT End of Session - 12/17/17 1515    Visit Number  7    Number of Visits  9    Date for PT Re-Evaluation  01/02/18    Authorization Type  [employer] Medicare: G-CODE AND PN EVERY 10TH VISIT.     PT Start Time  [phone]    PT Stop Time  1016    PT Time Calculation (min)  43 min       Past Medical History:  Diagnosis Date  . Allergy   . Arthritis    osteoarthritis  . Foot deformity    saw podiatrist remotely  . Heel spur   . Hyperlipidemia   . Hypertension   . Migraine     Past Surgical History:  Procedure Laterality Date  . no prior surgery      There were no vitals filed for this visit.  Subjective Assessment - 12/17/17 1513    Subjective  Pt states she is doing really well; states she feels she is doing about the best that she is going to do - pleased with progress and feels that she is able to manage the vertigo if it should occur    Pertinent History  OA, HTN, HLD, hx of vertigo, hx of migraines which stopped 20 years ago when pt started BP medication    Patient Stated Goals  To know what to do if she's alone and dizziness occurs, and be as normal as possible doing every day things.     Currently in Pain?  No/denies         [facility] PT Assessment - 12/16/17 0944      Functional Gait  Assessment   Gait Level Surface  Walks 20 ft in less than 5.5 sec, no assistive devices, good speed, no evidence for imbalance, normal gait pattern, deviates no more than 6 in outside of the 12 in walkway width.    Change in Gait Speed  Able to smoothly change walking speed without loss of balance or gait deviation. Deviate no more than 6 in outside of the 12 in walkway  width.    Gait with Horizontal Head Turns  Performs head turns smoothly with no change in gait. Deviates no more than 6 in outside 12 in walkway width    Gait with Vertical Head Turns  Performs head turns with no change in gait. Deviates no more than 6 in outside 12 in walkway width.    Gait and Pivot Turn  Pivot turns safely within 3 sec and stops quickly with no loss of balance.    Step Over Obstacle  Is able to step over 2 stacked shoe boxes taped together (9 in total height) without changing gait speed. No evidence of imbalance.    Gait with Narrow Base of Support  Is able to ambulate for 10 steps heel to toe with no staggering.    Gait with Eyes Closed  Walks 20 ft, no assistive devices, good speed, no evidence of imbalance, normal gait pattern, deviates no more than 6 in outside 12 in walkway width. Ambulates 20 ft in less than 7 sec.    Ambulating Backwards  Walks 20 ft, no assistive devices, good speed, no evidence  for imbalance, normal gait    Steps  Alternating feet, no rail.    Total Score  30      Sensory Organization Test - composite score 64/100 - N= 68/100  Somatosensory - WNL's  Visual - WNL's Vestibular - score 30/100 with N=53/100      Reviewed HEP - see pt instructions               PT Education - 12/17/17 1521    Education provided  Yes    Education Details  see pt instructions    Person(s) Educated  Patient    Methods  Explanation;Demonstration;Handout    Comprehension  Verbalized understanding;Returned demonstration       PT Short Term Goals - 07/21/17 0909      PT SHORT TERM GOAL #1   Title  same as LTGs        PT Long Term Goals - 12/16/17 0941      PT LONG TERM GOAL #1   Title  Pt will be IND in HEP to improve dizziness and balance.     Baseline  met 12-16-17    Status  Achieved      PT LONG TERM GOAL #2   Title  Perform FGA and positional testing and write goals as indicated.     Baseline  score 30/30    Status  Deferred       PT LONG TERM GOAL #3   Title  Pt will report no dizziness during all functional activities and position changes to improve safety during functional mobility.     Status  Achieved      PT LONG TERM GOAL #4   Title  Pt will perform SVA vs. DVA with <3 line difference to decr. dizziness.     Status  Deferred      PT LONG TERM GOAL #5   Title  demonstrate negative positional testing to indicate resolution of BPPV (re-occurrence on 10-14-17)    Status  Achieved            Plan - 12/17/17 1517    Clinical Impression Statement  Pt has met LTG's #1, 3 and 5:  goals # 2 and 4 deferrred; pt has signficantly increased vestibular input per SOT from minimal input from initial score to approx.  30/100 with N=53/100, demonstrating improved balance and vestibular input in maintaining balance.      Rehab Potential  Good    Clinical Impairments Affecting Rehab Potential  see above    PT Frequency  1x / week    PT Duration  4 weeks    PT Treatment/Interventions  ADLs/Self Care Home Management;Biofeedback;Canalith Repostioning;Therapeutic activities;Therapeutic exercise;Manual techniques;Vestibular;Functional mobility training;Stair training;Gait training;Patient/family education;DME Instruction;Neuromuscular re-education;Balance training    PT Next Visit Plan  D/C    PT Home Exercise Plan  tandem and SLS; progressed x1 viewing to patterned background on 08-14-17    Consulted and Agree with Plan of Care  Patient       Patient will benefit from skilled therapeutic intervention in order to improve the following deficits and impairments:  Abnormal gait, Decreased balance, Dizziness, Decreased knowledge of use of DME  Visit Diagnosis: Unsteadiness on feet  Other abnormalities of gait and mobility     Problem List Patient Active Problem List   Diagnosis Date Noted  . Acute non-recurrent sphenoidal sinusitis   . Vertigo 06/15/2017  . Hypokalemia 06/15/2017  . Tachycardia 06/15/2017  .  Osteoarthritis 10/26/2007  . Allergic rhinitis 05/05/2007  .  Essential hypertension 04/21/2007  . Hyperlipemia 02/26/2007    PHYSICAL THERAPY DISCHARGE SUMMARY  Visits from Start of Care: 7  Current functional level related to goals / functional outcomes: See above for progress towards goals   Remaining deficits: Continued decreased vestibular input in maintaining balance per SOT - much improved since initial SOT done 6  weeks ago  Education / Equipment: Pt has been instructed in HEP for balance and vestibular exercises Plan: Patient agrees to discharge.  Patient goals were met. Patient is being discharged due to meeting the stated rehab goals.  ?????        Hagen, Tidd, PT 12/17/2017, 3:22 PM  [facility] [address] [place], [place], [address] Phone: [phone]   Fax:  [phone]  Name: Michaela [name] MRN: [id number] Date of Birth: [birth year]

## 2018-01-11 NOTE — Progress Notes (Signed)
HPI:  Using dictation device. Unfortunately this device frequently misinterprets words/phrases.  Michaela [name] is a pleasant 67 y.o. here for follow up. Chronic medical problems summarized below were reviewed for changes.  Reports doing well.  Finished physical therapy for vertigo and has been doing much better.  Has been working in the garden a lot.  Continues to try to eat healthier. Denies CP, SOB, DOE, treatment intolerance or new symptoms. Due for labs  History of HTN and hypokalemia: -stopped diuretic and started losartan 11/26 -On beta-blocker in the past for a history of palpitations remotely, weaned off beta-blocker in 2018 due to lower heart rate  Obesity/HLD: -meds: atorvastatin -wt 163 02/2016 ---> today 11/18 149 --> 148 2/19 --> 147 (6/19) -she has been workong hard on eating healthier and is back to walking 2-3 miles a few times per week -she is pleased with her wt reduction  Seasonal allergies: -meds: flonase, allegra  History of vertigo: -Status post extensive evaluation in the hospital in 2018, diagnosed with labyrinthitis, underwent vestibular rehab -physical therapist diagnosed her with R BPPV and did PT in 2019  - doing much better with rare symptoms if turns too quickly a certain way   ROS: See pertinent positives and negatives per HPI.  Past Medical History:  Diagnosis Date  . Allergy   . Arthritis    osteoarthritis  . Foot deformity    saw podiatrist remotely  . Heel spur   . Hyperlipidemia   . Hypertension   . Migraine     Past Surgical History:  Procedure Laterality Date  . no prior surgery      Family History  Problem Relation Age of Onset  . Heart disease Mother   . Stroke Mother   . Early death Father        accidental death - trauma  . Colon cancer Paternal Uncle [id number]  . Stomach cancer Neg Hx     SOCIAL HX: See HPI   Current Outpatient Medications:  .  atorvastatin (LIPITOR) 40 MG tablet, TAKE 1 TABLET (40 MG TOTAL) BY MOUTH 2  (TWO) TIMES A WEEK., Disp: 26 tablet, Rfl: 4 .  fexofenadine (ALLEGRA) 180 MG tablet, Take 180 mg daily as needed by mouth for allergies or rhinitis., Disp: , Rfl:  .  fluticasone (FLONASE) 50 MCG/ACT nasal spray, SHAKE WELL BEFORE EACH USE AND INSTILL 1 TO 2 SPRAYS INTO EACH NOSTRIL ONCE DAILY, Disp: 16 g, Rfl: 5 .  losartan (COZAAR) 50 MG tablet, TAKE ONE TABLET BY MOUTH DAILY, Disp: 90 tablet, Rfl: 2 .  Probiotic Product (PROBIOTIC PO), Take 1 tablet daily by mouth. , Disp: , Rfl:  .  tretinoin (RETIN-A) 0.025 % cream, APPLY ONCE DAILY TO FACE AS DIRECTED, Disp: 45 g, Rfl: 3  EXAM:  Vitals:   01/12/18 0904  BP: 118/72  Pulse: 64  Temp: 98 F (36.7 C)    Body mass index is 26.58 kg/m.  GENERAL: vitals reviewed and listed above, alert, oriented, appears well hydrated and in no acute distress  HEENT: atraumatic, conjunttiva clear, no obvious abnormalities on inspection of external nose and ears  NECK: no obvious masses on inspection  LUNGS: clear to auscultation bilaterally, no wheezes, rales or rhonchi, good air movement  CV: HRRR, no peripheral edema  MS: moves all extremities without noticeable abnormality  PSYCH: pleasant and cooperative, no obvious depression or anxiety  ASSESSMENT AND PLAN:  Discussed the following assessment and plan:  Essential hypertension  Hyperlipidemia, unspecified hyperlipidemia type  Vertigo  BMI 26.0-26.9,adult  -Labs today -Healthy diet and regular exercise -Continue current medications, blood pressure better on recheck after sitting, discussed losartan recall -Follow-up in about 3 to 4 months, sooner as needed   Patient Instructions  BEFORE YOU LEAVE: -Labs -follow up: Follow-up in about 4 months  We have ordered labs or studies at this visit. It can take up to 1-2 weeks for results and processing. IF results require follow up or explanation, we will call you with instructions. Clinically stable results will be released to  your [facility]. If you have not heard from [place] or cannot find your results in [facility] in 2 weeks please contact our office at [phone].  If you are not yet signed up for [facility], please consider signing up.  Continue a healthy diet and regular exercise.  Continue current medications.  Check with your pharmacy regarding the losartan recall.           Terressa Koyanagi, DO

## 2018-01-12 ENCOUNTER — Ambulatory Visit: Payer: Medicare Other | Admitting: Family Medicine

## 2018-01-12 ENCOUNTER — Encounter: Payer: Self-pay | Admitting: Family Medicine

## 2018-01-12 VITALS — BP 118/72 | HR 64 | Temp 98.0°F | Ht 62.5 in | Wt 147.7 lb

## 2018-01-12 DIAGNOSIS — R42 Dizziness and giddiness: Secondary | ICD-10-CM | POA: Diagnosis not present

## 2018-01-12 DIAGNOSIS — E785 Hyperlipidemia, unspecified: Secondary | ICD-10-CM | POA: Diagnosis not present

## 2018-01-12 DIAGNOSIS — I10 Essential (primary) hypertension: Secondary | ICD-10-CM

## 2018-01-12 DIAGNOSIS — Z6826 Body mass index (BMI) 26.0-26.9, adult: Secondary | ICD-10-CM | POA: Diagnosis not present

## 2018-01-12 LAB — BASIC METABOLIC PANEL
BUN: 14 mg/dL (ref 6–23)
CALCIUM: 9.6 mg/dL (ref 8.4–10.5)
CO2: 29 mEq/L (ref 19–32)
CREATININE: 0.7 mg/dL (ref 0.40–1.20)
Chloride: 105 mEq/L (ref 96–112)
GFR: 88.83 mL/min (ref >60.00)
Glucose, Bld: 80 mg/dL (ref 70–99)
Potassium: 4.5 mEq/L (ref 3.5–5.1)
Sodium: 142 mEq/L (ref 135–145)

## 2018-01-12 LAB — CBC
HCT: 41.2 % (ref 36.0–46.0)
Hemoglobin: 14 g/dL (ref 12.0–15.0)
MCHC: 34 g/dL (ref 30.0–36.0)
MCV: 88.8 fl (ref 78.0–100.0)
Platelets: 182 10*3/uL (ref 150.0–400.0)
RBC: 4.64 Mil/uL (ref 3.87–5.11)
RDW: 13.4 % (ref 11.5–15.5)
WBC: 4.2 10*3/uL (ref 4.0–10.5)

## 2018-01-12 NOTE — Patient Instructions (Addendum)
BEFORE YOU LEAVE: -Labs -follow up: Follow-up in about 4 months  We have ordered labs or studies at this visit. It can take up to 1-2 weeks for results and processing. IF results require follow up or explanation, we will call you with instructions. Clinically stable results will be released to your [facility]. If you have not heard from us or cannot find your results in [facility] in 2 weeks please contact our office at [phone].  If you are not yet signed [facility] for [facility], please consider signing up.  Continue a healthy diet and regular exercise.  Continue current medications.  Check with your pharmacy regarding the losartan recall.

## 2018-01-13 ENCOUNTER — Ambulatory Visit: Payer: Medicare Other | Admitting: Family Medicine

## 2018-05-14 NOTE — Progress Notes (Signed)
HPI:  Using dictation device. Unfortunately this device frequently misinterprets words/phrases.  Michaela [name] is a pleasant 67 y.o. here for follow up. Chronic medical problems summarized below were reviewed for changes and stability and were updated as needed below. These issues and their treatment remain stable for the most part.  Doing well today.  Reports she had a cold last week that is now improving.  She had congestion and a cough.  She always worries about getting labyrinthitis again.  She had a history of vertigo in the past that was quite severe and we are unsure if this was labyrinthitis, sinusitis or paroxysmal positional vertigo.  She is not having any trouble with this currently.. Denies CP, SOB, treatment intolerance or new symptoms. AWV 09/2017 with Michaela [name] Due for flu shot, BP labs  History ofHTN and hypokalemia: -stopped diuretic and started losartan -On beta-blocker in the past for a history of palpitations remotely,weaned off beta-blocker in 2018 due to lower heart rate  Obesity/HLD: -meds: atorvastatin -wt 163 02/2016 ---> today 11/18 149--> 148 2/19 --> 147 (6/19) --> 146 10/19 -she has been workinghard on eating healthier and is backto walking2-3 miles a few times per week -she is pleased with her wt reduction  Seasonal allergies: -meds: flonase, allegra  History of vertigo: -Status post extensive evaluation in the hospital in 2018,diagnosed with labyrinthitis,underwent vestibular rehab -physical therapist diagnosed her withR BPPV and did PT in 2019  - doing much better with rare symptoms if turns too quickly a certain way   ROS: See pertinent positives and negatives per HPI.  Past Medical History:  Diagnosis Date  . Allergy   . Arthritis    osteoarthritis  . Foot deformity    saw podiatrist remotely  . Heel spur   . Hyperlipidemia   . Hypertension   . Migraine     Past Surgical History:  Procedure Laterality Date  . no prior surgery       Family History  Problem Relation Age of Onset  . Heart disease Mother   . Stroke Mother   . Early death Father        accidental death - trauma  . Colon cancer Paternal Uncle 33  . Stomach cancer Neg Hx     SOCIAL HX: See HPI   Current Outpatient Medications:  .  atorvastatin (LIPITOR) 40 MG tablet, TAKE 1 TABLET (40 MG TOTAL) BY MOUTH 2 (TWO) TIMES A WEEK., Disp: 26 tablet, Rfl: 4 .  fexofenadine (ALLEGRA) 180 MG tablet, Take 180 mg daily as needed by mouth for allergies or rhinitis., Disp: , Rfl:  .  fluticasone (FLONASE) 50 MCG/ACT nasal spray, SHAKE WELL BEFORE EACH USE AND INSTILL 1 TO 2 SPRAYS INTO EACH NOSTRIL ONCE DAILY, Disp: 16 g, Rfl: 5 .  losartan (COZAAR) 50 MG tablet, TAKE ONE TABLET BY MOUTH DAILY, Disp: 90 tablet, Rfl: 2 .  tretinoin (RETIN-A) 0.025 % cream, APPLY ONCE DAILY TO FACE AS DIRECTED, Disp: 45 g, Rfl: 3  EXAM:  Vitals:   05/18/18 0913  BP: 120/78  Pulse: 64  Temp: 98.3 F (36.8 C)  SpO2: 98%    Body mass index is 26.3 kg/m.  GENERAL: vitals reviewed and listed above, alert, oriented, appears well hydrated and in no acute distress  HEENT: atraumatic, conjunttiva clear, no obvious abnormalities on inspection of external nose and ears, normal appearance of ear canals and TMs, clear nasal congestion, mild post oropharyngeal erythema with PND, no tonsillar edema or exudate, no sinus TTP  NECK: no obvious masses on inspection  LUNGS: clear to auscultation bilaterally, no wheezes, rales or rhonchi, good air movement  CV: HRRR, no peripheral edema  MS: moves all extremities without noticeable abnormality  PSYCH: pleasant and cooperative, no obvious depression or anxiety  ASSESSMENT AND PLAN:  Discussed the following assessment and plan:  Essential hypertension - Plan: Basic metabolic panel, CBC  Hyperlipidemia, unspecified hyperlipidemia type  BMI 26.0-26.9,adult  Hx of vertigo  -Labs today -Flu shot today -Discussed various  causes of vertigo  -Follow-up and annual wellness visit in February -Continued healthy diet and regular exercise, she continues to decrease her weight -Follow-up sooner as needed   Patient Instructions  BEFORE YOU LEAVE: -flu shot -please print her out a copy of her vaccine record here -labs -follow up: February for AWV with susan and follow up with Dr. Selena [name]  We have ordered labs or studies at this visit. It can take up to 1-2 weeks for results and processing. IF results require follow up or explanation, we will call you with instructions. Clinically stable results will be released to your [facility]. If you have not heard from [place] or cannot find your results in [facility] in 2 weeks please contact our office at [phone].  If you are not yet signed up for [facility], please consider signing up.    We recommend the following healthy lifestyle for LIFE: 1) Small portions. But, make sure to get regular (at least 3 per day), healthy meals and small healthy snacks if needed.  2) Eat a healthy clean diet.   TRY TO EAT: -at least 5-7 servings of low sugar, colorful, and nutrient rich vegetables per day (not corn, potatoes or bananas.) -berries are the best choice if you wish to eat fruit (only eat small amounts if trying to reduce weight)  -lean meets (fish, white meat of chicken or [place]) -vegan proteins for some meals - beans or tofu, whole grains, nuts and seeds -Replace bad fats with good fats - good fats include: fish, nuts and seeds, canola oil, olive oil -small amounts of low fat or non fat dairy -small amounts of100 % whole grains - check the lables -drink plenty of water  AVOID: -SUGAR, sweets, anything with added sugar, corn syrup or sweeteners - must read labels as even foods advertised as "healthy" often are loaded with sugar -if you must have a sweetener, small amounts of stevia may be best -sweetened beverages and artificially sweetened beverages -simple starches (rice, bread,  potatoes, pasta, chips, etc - small amounts of 100% whole grains are ok) -red meat, pork, butter -fried foods, fast food, processed food, excessive dairy, eggs and coconut.  3)Get at least 150 minutes of sweaty aerobic exercise per week.  4)Reduce stress - consider counseling, meditation and relaxation to balance other aspects of your life.        Terressa Koyanagi, DO

## 2018-05-18 ENCOUNTER — Encounter: Payer: Self-pay | Admitting: Family Medicine

## 2018-05-18 ENCOUNTER — Ambulatory Visit: Payer: Medicare Other | Admitting: Family Medicine

## 2018-05-18 VITALS — BP 120/78 | HR 64 | Temp 98.3°F | Ht 62.5 in | Wt 146.1 lb

## 2018-05-18 DIAGNOSIS — Z6826 Body mass index (BMI) 26.0-26.9, adult: Secondary | ICD-10-CM

## 2018-05-18 DIAGNOSIS — E785 Hyperlipidemia, unspecified: Secondary | ICD-10-CM

## 2018-05-18 DIAGNOSIS — Z87898 Personal history of other specified conditions: Secondary | ICD-10-CM | POA: Diagnosis not present

## 2018-05-18 DIAGNOSIS — I10 Essential (primary) hypertension: Secondary | ICD-10-CM

## 2018-05-18 DIAGNOSIS — Z23 Encounter for immunization: Secondary | ICD-10-CM

## 2018-05-18 NOTE — Addendum Note (Signed)
Addended by: Sallee Lange A on: 05/18/2018 10:00 AM   Modules accepted: Orders

## 2018-05-18 NOTE — Patient Instructions (Signed)
BEFORE YOU LEAVE: -flu shot -please print her out a copy of her vaccine record here -labs -follow up: February for AWV with susan and follow up with Dr. Selena Batten  We have ordered labs or studies at this visit. It can take up to 1-2 weeks for results and processing. IF results require follow up or explanation, we will call you with instructions. Clinically stable results will be released to your [facility]. If you have not heard from [place] or cannot find your results in [facility] in 2 weeks please contact our office at [phone].  If you are not yet signed up for [facility], please consider signing up.    We recommend the following healthy lifestyle for LIFE: 1) Small portions. But, make sure to get regular (at least 3 per day), healthy meals and small healthy snacks if needed.  2) Eat a healthy clean diet.   TRY TO EAT: -at least 5-7 servings of low sugar, colorful, and nutrient rich vegetables per day (not corn, potatoes or bananas.) -berries are the best choice if you wish to eat fruit (only eat small amounts if trying to reduce weight)  -lean meets (fish, white meat of chicken or [place]) -vegan proteins for some meals - beans or tofu, whole grains, nuts and seeds -Replace bad fats with good fats - good fats include: fish, nuts and seeds, canola oil, olive oil -small amounts of low fat or non fat dairy -small amounts of100 % whole grains - check the lables -drink plenty of water  AVOID: -SUGAR, sweets, anything with added sugar, corn syrup or sweeteners - must read labels as even foods advertised as "healthy" often are loaded with sugar -if you must have a sweetener, small amounts of stevia may be best -sweetened beverages and artificially sweetened beverages -simple starches (rice, bread, potatoes, pasta, chips, etc - small amounts of 100% whole grains are ok) -red meat, pork, butter -fried foods, fast food, processed food, excessive dairy, eggs and coconut.  3)Get at least 150 minutes of  sweaty aerobic exercise per week.  4)Reduce stress - consider counseling, meditation and relaxation to balance other aspects of your life.

## 2018-05-20 ENCOUNTER — Telehealth: Payer: Self-pay | Admitting: Family Medicine

## 2018-05-20 NOTE — Telephone Encounter (Signed)
Michaela Lout, NP with [facility] called to give a result of Quantoflo Study today. She says normal value > 0.99. Patient's left foot normal at 1.2, right foot abnormal at 0.89. She says a copy of the notes with the flow study attached will be sent out in 2-4 weeks to the provider.

## 2018-05-20 NOTE — Telephone Encounter (Signed)
FYI

## 2018-05-20 NOTE — Telephone Encounter (Signed)
Please call this NP. Why did she go to her house? Why did she do this test?   If they are going to do tests, then they should manage the results. Please advise her to manage the results or instruct the patient on what to do with the results. If she needs me to do something then she should explain the results to the patient, then have the patient follow up with me for an appointment if anything further is needed. Thanks.

## 2018-05-21 NOTE — Telephone Encounter (Signed)
Left message on machine for NP to return our call. CRM

## 2018-07-20 ENCOUNTER — Other Ambulatory Visit: Payer: Self-pay | Admitting: Family Medicine

## 2018-08-12 ENCOUNTER — Other Ambulatory Visit: Payer: Self-pay | Admitting: Family Medicine

## 2018-08-31 LAB — HM MAMMOGRAPHY

## 2018-09-16 NOTE — Progress Notes (Signed)
HPI:  Using dictation device. Unfortunately this device frequently misinterprets words/phrases.  Michaela [name] is a pleasant 68 y.o. here for follow up. Chronic medical problems summarized below were reviewed for changes. Reports has been doing really well over all. Feels balance is much better.  She has some R shoulder pain intermittent. This has been there for 3-4 years. Feels mild-mod pain at times particulately with activities that involve abd above 90 degrees. No radiation, weakness, malaise. Denies CP, SOB, DOE, treatment intolerance or new symptoms. Did AWV with Michaela [name], [facility] today - see separate note. Due for labs.  History ofHTN and hypokalemia: -stopped diuretic and started losartan -On beta-blocker in the past for a history of palpitations remotely,weaned off beta-blocker in 2018 due to lower heart rate  Obesity/HLD: -meds: atorvastatin -wt 163 02/2016 ---> today 11/18 149--> 148 2/19--> 147 (6/19) --> 146 10/19 --> 152 09/2018 -she has been workinghard on eating healthier and is backto walking2-3 miles a few times per week -she is pleased with her wt reduction  Seasonal allergies: -meds: flonase, allegra  History of vertigo: -Status post extensive evaluation in the hospital in 2018,diagnosed with labyrinthitis,underwent vestibular rehab -physical therapist diagnosed her withR BPPV anddid PT in 2019   ROS: See pertinent positives and negatives per HPI.  Past Medical History:  Diagnosis Date  . Allergy   . Arthritis    osteoarthritis  . Foot deformity    saw podiatrist remotely  . Heel spur   . Hyperlipidemia   . Hypertension   . Migraine     Past Surgical History:  Procedure Laterality Date  . no prior surgery      Family History  Problem Relation Age of Onset  . Heart disease Mother   . Stroke Mother   . Early death Father        accidental death - trauma  . Colon cancer Paternal Uncle 14  . Stomach cancer Neg Hx     SOCIAL HX:  see hpi   Current Outpatient Medications:  .  atorvastatin (LIPITOR) 40 MG tablet, TAKE ONE TABLET BY MOUTH TWO TIMES PER WEEK, Disp: 26 tablet, Rfl: 2 .  fexofenadine (ALLEGRA) 180 MG tablet, Take 180 mg daily as needed by mouth for allergies or rhinitis., Disp: , Rfl:  .  fluticasone (FLONASE) 50 MCG/ACT nasal spray, SHAKE WELL BEFORE EACH USE AND INSTILL 1 TO 2 SPRAYS INTO EACH NOSTRIL ONCE DAILY, Disp: 16 g, Rfl: 5 .  losartan (COZAAR) 50 MG tablet, TAKE ONE TABLET BY MOUTH DAILY, Disp: 90 tablet, Rfl: 1 .  tretinoin (RETIN-A) 0.025 % cream, APPLY ONCE DAILY TO FACE AS DIRECTED, Disp: 45 g, Rfl: 3  EXAM:  Vitals:   09/17/18 1050  BP: 120/72  Pulse: (!) 54  Temp: 98.4 F (36.9 C)    Body mass index is 26.93 kg/m.  GENERAL: vitals reviewed and listed above, alert, oriented, appears well hydrated and in no acute distress  HEENT: atraumatic, conjunttiva clear, no obvious abnormalities on inspection of external nose and ears  NECK: no obvious masses on inspection  LUNGS: clear to auscultation bilaterally, no wheezes, rales or rhonchi, good air movement  CV: HRRR, no peripheral edema  MS: moves all extremities without noticeable abnormality, head forward shoulder position, TTP R shoulder in area of RTC attachment to the humerus R, + impingment testing, normal strength throughout in the bilat UEs/shoulder, neg empty can, neg shawl sign, neg speeds, NV intact distal  PSYCH: pleasant and cooperative, no obvious depression or  anxiety  ASSESSMENT AND PLAN:  Discussed the following assessment and plan:  Essential hypertension - Plan: Basic metabolic panel, CBC  Hyperlipidemia, unspecified hyperlipidemia type - Plan: Lipid panel  Chronic right shoulder pain  BMI 26.0-26.9,adult (overweight)  -labs per orders -lifestyle recs below -we discussed possible serious and likely etiologies, workup and treatment, treatment risks and return precautions for the shoulder pain, query RTC  tendinopathy -after this discussion, Michaela [name] opted for HEP, postural changes, prn symptomatic care, she prefers this to further eval at this point; advised prompt re-eval if not improving  -follow up advised 1 month if shoulder issues, o/w 4-6 months -of course, we advised Michaela [name]  to return or notify a doctor immediately if symptoms worsen or persist or new concerns arise.    Patient Instructions  BEFORE YOU LEAVE: -rotator cuff exercises -labs -follow up: 1 month if any shoulder pain persists; 4-6 months otherwise  Do the exercises for the shoulder  Ice as needed  Topical menthol if needed for pain (tiger balm), tylenol or aleve per instructions only if needed for pain.  I hope the shoulder is feeling better soon. Follow up in 1 month if persists, sooner if worsening.  We have ordered labs or studies at this visit. It can take up to 1-2 weeks for results and processing. IF results require follow up or explanation, we will call you with instructions. Clinically stable results will be released to your [facility]. If you have not heard from [place] or cannot find your results in [facility] in 2 weeks please contact our office at [phone].  If you are not yet signed up for [facility], please consider signing up.   We recommend the following healthy lifestyle for LIFE: 1) Small portions. But, make sure to get regular (at least 3 per day), healthy meals and small healthy snacks if needed.  2) Eat a healthy clean diet.   TRY TO EAT: -at least 5-7 servings of low sugar, colorful, and nutrient rich vegetables per day (not corn, potatoes or bananas.) -berries are the best choice if you wish to eat fruit (only eat small amounts if trying to reduce weight)  -lean meets (fish, white meat of chicken or [place]) -vegan proteins for some meals - beans or tofu, whole grains, nuts and seeds -Replace bad fats with good fats - good fats include: fish, nuts and seeds, canola oil, olive oil -small amounts of low fat or  non fat dairy -small amounts of100 % whole grains - check the lables -drink plenty of water  AVOID: -SUGAR, sweets, anything with added sugar, corn syrup or sweeteners - must read labels as even foods advertised as "healthy" often are loaded with sugar -if you must have a sweetener, small amounts of stevia may be best -sweetened beverages and artificially sweetened beverages -simple starches (rice, bread, potatoes, pasta, chips, etc - small amounts of 100% whole grains are ok) -red meat, pork, butter -fried foods, fast food, processed food, excessive dairy, eggs and coconut.  3)Get at least 150 minutes of sweaty aerobic exercise per week.  4)Reduce stress - consider counseling, meditation and relaxation to balance other aspects of your life.            Terressa Koyanagi, DO

## 2018-09-17 ENCOUNTER — Ambulatory Visit (INDEPENDENT_AMBULATORY_CARE_PROVIDER_SITE_OTHER): Payer: Medicare Other

## 2018-09-17 ENCOUNTER — Ambulatory Visit (INDEPENDENT_AMBULATORY_CARE_PROVIDER_SITE_OTHER): Payer: Medicare Other | Admitting: Family Medicine

## 2018-09-17 ENCOUNTER — Encounter: Payer: Self-pay | Admitting: Family Medicine

## 2018-09-17 VITALS — BP 132/76 | HR 54 | Temp 98.4°F | Resp 16 | Ht 63.0 in | Wt 152.0 lb

## 2018-09-17 VITALS — BP 120/72 | HR 54 | Temp 98.4°F | Ht 63.0 in | Wt 152.0 lb

## 2018-09-17 DIAGNOSIS — M25511 Pain in right shoulder: Secondary | ICD-10-CM | POA: Diagnosis not present

## 2018-09-17 DIAGNOSIS — Z13820 Encounter for screening for osteoporosis: Secondary | ICD-10-CM | POA: Diagnosis not present

## 2018-09-17 DIAGNOSIS — E785 Hyperlipidemia, unspecified: Secondary | ICD-10-CM

## 2018-09-17 DIAGNOSIS — I10 Essential (primary) hypertension: Secondary | ICD-10-CM

## 2018-09-17 DIAGNOSIS — G8929 Other chronic pain: Secondary | ICD-10-CM

## 2018-09-17 DIAGNOSIS — E663 Overweight: Secondary | ICD-10-CM

## 2018-09-17 DIAGNOSIS — Z6826 Body mass index (BMI) 26.0-26.9, adult: Secondary | ICD-10-CM

## 2018-09-17 DIAGNOSIS — Z0000 Encounter for general adult medical examination without abnormal findings: Secondary | ICD-10-CM | POA: Diagnosis not present

## 2018-09-17 LAB — CBC
HCT: 41.7 % (ref 36.0–46.0)
Hemoglobin: 14.1 g/dL (ref 12.0–15.0)
MCHC: 33.8 g/dL (ref 30.0–36.0)
MCV: 89.3 fl (ref 78.0–100.0)
PLATELETS: 202 10*3/uL (ref 150.0–400.0)
RBC: 4.67 Mil/uL (ref 3.87–5.11)
RDW: 12.7 % (ref 11.5–15.5)
WBC: 5.2 10*3/uL (ref 4.0–10.5)

## 2018-09-17 LAB — LIPID PANEL
Cholesterol: 206 mg/dL — ABNORMAL HIGH (ref 0–200)
HDL: 84.3 mg/dL (ref >39.00)
LDL Cholesterol: 101 mg/dL — ABNORMAL HIGH (ref 0–99)
NonHDL: 121.81
Total CHOL/HDL Ratio: 2
Triglycerides: 106 mg/dL (ref 0.0–149.0)
VLDL: 21.2 mg/dL (ref 0.0–40.0)

## 2018-09-17 LAB — BASIC METABOLIC PANEL
BUN: 16 mg/dL (ref 6–23)
CO2: 28 mEq/L (ref 19–32)
Calcium: 9.3 mg/dL (ref 8.4–10.5)
Chloride: 104 mEq/L (ref 96–112)
Creatinine, Ser: 0.75 mg/dL (ref 0.40–1.20)
GFR: 77.02 mL/min (ref >60.00)
GLUCOSE: 80 mg/dL (ref 70–99)
Potassium: 3.8 mEq/L (ref 3.5–5.1)
Sodium: 141 mEq/L (ref 135–145)

## 2018-09-17 NOTE — Patient Instructions (Addendum)
Bring a copy of your living will and/or healthcare power of attorney to your next office visit.  Follow-up with Dr. Maudie Mercury about need for vaccines (particularly Hep A), or need for titers prior to travel in [place]  Follow-up with Dr. Maudie Mercury about gait instability concern, and R arm pain/numbness.  Keep staying physically and mentally active!   [name] , Thank you for taking time to come for your Medicare Wellness Visit. I appreciate your ongoing commitment to your health goals. Please review the following plan we discussed and let me know if I can assist you in the future.   These are the goals we discussed: Goals    . Exercise 150 min/wk Moderate Activity     Will continue your diet and try Tai chi    . Patient Stated     Stay healthy, keep travel plans for [place]!  Increase walking daily!  Incorporate Vitamin D into med regimine for fracture prevention!       This is a list of the screening recommended for you and due dates:  Health Maintenance  Topic Date Due  . Mammogram  09/05/2018  . DEXA scan (bone density measurement)  10/11/2018  . Colon Cancer Screening  04/17/2023  . Tetanus Vaccine  02/14/2026  . Flu Shot  Completed  .  Hepatitis C: One time screening is recommended by Center for Disease Control  (CDC) for  adults born from [birth year] through 1965.   Completed  . Pneumonia vaccines  Completed     Fall Prevention in the Home, Adult Falls can cause injuries. They can happen to people of all ages. There are many things you can do to make your home safe and to help prevent falls. Ask for help when making these changes, if needed. What actions can I take to prevent falls? General Instructions  Use good lighting in all rooms. Replace any light bulbs that burn out.  Turn on the lights when you go into a dark area. Use night-lights.  Keep items that you use often in easy-to-reach places. Lower the shelves around your home if necessary.  Set up your furniture so you have a  clear path. Avoid moving your furniture around.  Do not have throw rugs and other things on the floor that can make you trip.  Avoid walking on wet floors.  If any of your floors are uneven, fix them.  Add color or contrast paint or tape to clearly mark and help you see: ? Any grab bars or handrails. ? First and last steps of stairways. ? Where the edge of each step is.  If you use a stepladder: ? Make sure that it is fully opened. Do not climb a closed stepladder. ? Make sure that both sides of the stepladder are locked into place. ? Ask someone to hold the stepladder for you while you use it.  If there are any pets around you, be aware of where they are. What can I do in the bathroom?      Keep the floor dry. Clean up any water that spills onto the floor as soon as it happens.  Remove soap buildup in the tub or shower regularly.  Use non-skid mats or decals on the floor of the tub or shower.  Attach bath mats securely with double-sided, non-slip rug tape.  If you need to sit down in the shower, use a plastic, non-slip stool.  Install grab bars by the toilet and in the tub and shower. Do  not use towel bars as grab bars. What can I do in the bedroom?  Make sure that you have a light by your bed that is easy to reach.  Do not use any sheets or blankets that are too big for your bed. They should not hang down onto the floor.  Have a firm chair that has side arms. You can use this for support while you get dressed. What can I do in the kitchen?  Clean up any spills right away.  If you need to reach something above you, use a strong step stool that has a grab bar.  Keep electrical cords out of the way.  Do not use floor polish or wax that makes floors slippery. If you must use wax, use non-skid floor wax. What can I do with my stairs?  Do not leave any items on the stairs.  Make sure that you have a light switch at the top of the stairs and the bottom of the stairs.  If you do not have them, ask someone to add them for you.  Make sure that there are handrails on both sides of the stairs, and use them. Fix handrails that are broken or loose. Make sure that handrails are as long as the stairways.  Install non-slip stair treads on all stairs in your home.  Avoid having throw rugs at the top or bottom of the stairs. If you do have throw rugs, attach them to the floor with carpet tape.  Choose a carpet that does not hide the edge of the steps on the stairway.  Check any carpeting to make sure that it is firmly attached to the stairs. Fix any carpet that is loose or worn. What can I do on the outside of my home?  Use bright outdoor lighting.  Regularly fix the edges of walkways and driveways and fix any cracks.  Remove anything that might make you trip as you walk through a door, such as a raised step or threshold.  Trim any bushes or trees on the path to your home.  Regularly check to see if handrails are loose or broken. Make sure that both sides of any steps have handrails.  Install guardrails along the edges of any raised decks and porches.  Clear walking paths of anything that might make someone trip, such as tools or rocks.  Have any leaves, snow, or ice cleared regularly.  Use sand or salt on walking paths during winter.  Clean up any spills in your garage right away. This includes grease or oil spills. What other actions can I take?  Wear shoes that: ? Have a low heel. Do not wear high heels. ? Have rubber bottoms. ? Are comfortable and fit you well. ? Are closed at the toe. Do not wear open-toe sandals.  Use tools that help you move around (mobility aids) if they are needed. These include: ? Canes. ? Walkers. ? Scooters. ? Crutches.  Review your medicines with your doctor. Some medicines can make you feel dizzy. This can increase your chance of falling. Ask your doctor what other things you can do to help prevent falls. Where to  find more information  Centers for Disease Control and Prevention, [facility]AD[facility]: https://garcia.biz/  [employer] on Aging: BrainJudge.co.uk Contact a doctor if:  You are afraid of falling at home.  You feel weak, drowsy, or dizzy at home.  You fall at home. Summary  There are many simple things that you can do to make  your home safe and to help prevent falls.  Ways to make your home safe include removing tripping hazards and installing grab bars in the bathroom.  Ask for help when making these changes in your home. This information is not intended to replace advice given to you by your health care provider. Make sure you discuss any questions you have with your health care provider. Document Released: 05/18/2009 Document Revised: 03/06/2017 Document Reviewed: 03/06/2017 Elsevier Interactive Patient Education  2019 [place] Maintenance, Female Adopting a healthy lifestyle and getting preventive care can go a long way to promote health and wellness. Talk with your health care provider about what schedule of regular examinations is right for you. This is a good chance for you to check in with your provider about disease prevention and staying healthy. In between checkups, there are plenty of things you can do on your own. Experts have done a lot of research about which lifestyle changes and preventive measures are most likely to keep you healthy. Ask your health care provider for more information. Weight and diet Eat a healthy diet  Be sure to include plenty of vegetables, fruits, low-fat dairy products, and lean protein.  Do not eat a lot of foods high in solid fats, added sugars, or salt.  Get regular exercise. This is one of the most important things you can do for your health. ? Most adults should exercise for at least 150 minutes each week. The exercise should increase your heart rate and make you sweat (moderate-intensity exercise). ? Most adults should  also do strengthening exercises at least twice a week. This is in addition to the moderate-intensity exercise. Maintain a healthy weight  Body mass index (BMI) is a measurement that can be used to identify possible weight problems. It estimates body fat based on height and weight. Your health care provider can help determine your BMI and help you achieve or maintain a healthy weight.  For females 29 years of age and older: ? A BMI below 18.5 is considered underweight. ? A BMI of 18.5 to 24.9 is normal. ? A BMI of 25 to 29.9 is considered overweight. ? A BMI of 30 and above is considered obese. Watch levels of cholesterol and blood lipids  You should start having your blood tested for lipids and cholesterol at 68 years of age, then have this test every 5 years.  You may need to have your cholesterol levels checked more often if: ? Your lipid or cholesterol levels are high. ? You are older than 68 years of age. ? You are at high risk for heart disease. Cancer screening Lung Cancer  Lung cancer screening is recommended for adults 37-45 years old who are at high risk for lung cancer because of a history of smoking.  A yearly low-dose CT scan of the lungs is recommended for people who: ? Currently smoke. ? Have quit within the past 15 years. ? Have at least a 30-pack-year history of smoking. A pack year is smoking an average of one pack of cigarettes a day for 1 year.  Yearly screening should continue until it has been 15 years since you quit.  Yearly screening should stop if you develop a health problem that would prevent you from having lung cancer treatment. Breast Cancer  Practice breast self-awareness. This means understanding how your breasts normally appear and feel.  It also means doing regular breast self-exams. Let your health care provider know about any changes, no matter how  small.  If you are in your 20s or 30s, you should have a clinical breast exam (CBE) by a health  care provider every 1-3 years as part of a regular health exam.  If you are 40 or older, have a CBE every year. Also consider having a breast X-ray (mammogram) every year.  If you have a family history of breast cancer, talk to your health care provider about genetic screening.  If you are at high risk for breast cancer, talk to your health care provider about having an MRI and a mammogram every year.  Breast cancer gene (BRCA) assessment is recommended for women who have family members with BRCA-related cancers. BRCA-related cancers include: ? Breast. ? Ovarian. ? Tubal. ? Peritoneal cancers.  Results of the assessment will determine the need for genetic counseling and BRCA1 and BRCA2 testing. Cervical Cancer Your health care provider may recommend that you be screened regularly for cancer of the pelvic organs (ovaries, uterus, and vagina). This screening involves a pelvic examination, including checking for microscopic changes to the surface of your cervix (Pap test). You may be encouraged to have this screening done every 3 years, beginning at age 43.  For women ages 34-65, health care providers may recommend pelvic exams and Pap testing every 3 years, or they may recommend the Pap and pelvic exam, combined with testing for human papilloma virus (HPV), every 5 years. Some types of HPV increase your risk of cervical cancer. Testing for HPV may also be done on women of any age with unclear Pap test results.  Other health care providers may not recommend any screening for nonpregnant women who are considered low risk for pelvic cancer and who do not have symptoms. Ask your health care provider if a screening pelvic exam is right for you.  If you have had past treatment for cervical cancer or a condition that could lead to cancer, you need Pap tests and screening for cancer for at least 20 years after your treatment. If Pap tests have been discontinued, your risk factors (such as having a new  sexual partner) need to be reassessed to determine if screening should resume. Some women have medical problems that increase the chance of getting cervical cancer. In these cases, your health care provider may recommend more frequent screening and Pap tests. Colorectal Cancer  This type of cancer can be detected and often prevented.  Routine colorectal cancer screening usually begins at 68 years of age and continues through 68 years of age.  Your health care provider may recommend screening at an earlier age if you have risk factors for colon cancer.  Your health care provider may also recommend using home test kits to check for hidden blood in the stool.  A small camera at the end of a tube can be used to examine your colon directly (sigmoidoscopy or colonoscopy). This is done to check for the earliest forms of colorectal cancer.  Routine screening usually begins at age 80.  Direct examination of the colon should be repeated every 5-10 years through 68 years of age. However, you may need to be screened more often if early forms of precancerous polyps or small growths are found. Skin Cancer  Check your skin from head to toe regularly.  Tell your health care provider about any new moles or changes in moles, especially if there is a change in a mole's shape or color.  Also tell your health care provider if you have a mole that is larger  than the size of a pencil eraser.  Always use sunscreen. Apply sunscreen liberally and repeatedly throughout the day.  Protect yourself by wearing long sleeves, pants, a wide-brimmed hat, and sunglasses whenever you are outside. Heart disease, diabetes, and high blood pressure  High blood pressure causes heart disease and increases the risk of stroke. High blood pressure is more likely to develop in: ? People who have blood pressure in the high end of the normal range (130-139/85-89 mm Hg). ? People who are overweight or obese. ? People who are African  American.  If you are 72-9 years of age, have your blood pressure checked every 3-5 years. If you are 33 years of age or older, have your blood pressure checked every year. You should have your blood pressure measured twice-once when you are at a hospital or clinic, and once when you are not at a hospital or clinic. Record the average of the two measurements. To check your blood pressure when you are not at a hospital or clinic, you can use: ? An automated blood pressure machine at a pharmacy. ? A home blood pressure monitor.  If you are between 66 years and 3 years old, ask your health care provider if you should take aspirin to prevent strokes.  Have regular diabetes screenings. This involves taking a blood sample to check your fasting blood sugar level. ? If you are at a normal weight and have a low risk for diabetes, have this test once every three years after 68 years of age. ? If you are overweight and have a high risk for diabetes, consider being tested at a younger age or more often. Preventing infection Hepatitis B  If you have a higher risk for hepatitis B, you should be screened for this virus. You are considered at high risk for hepatitis B if: ? You were born in a country where hepatitis B is common. Ask your health care provider which countries are considered high risk. ? Your parents were born in a high-risk country, and you have not been immunized against hepatitis B (hepatitis B vaccine). ? You have HIV or AIDS. ? You use needles to inject street drugs. ? You live with someone who has hepatitis B. ? You have had sex with someone who has hepatitis B. ? You get hemodialysis treatment. ? You take certain medicines for conditions, including cancer, organ transplantation, and autoimmune conditions. Hepatitis C  Blood testing is recommended for: ? Everyone born from [birth year] through 1965. ? Anyone with known risk factors for hepatitis C. Sexually transmitted infections  (STIs)  You should be screened for sexually transmitted infections (STIs) including gonorrhea and chlamydia if: ? You are sexually active and are younger than 68 years of age. ? You are older than 68 years of age and your health care provider tells you that you are at risk for this type of infection. ? Your sexual activity has changed since you were last screened and you are at an increased risk for chlamydia or gonorrhea. Ask your health care provider if you are at risk.  If you do not have HIV, but are at risk, it may be recommended that you take a prescription medicine daily to prevent HIV infection. This is called pre-exposure prophylaxis (PrEP). You are considered at risk if: ? You are sexually active and do not regularly use condoms or know the HIV status of your partner(s). ? You take drugs by injection. ? You are sexually active with a partner who  has HIV. Talk with your health care provider about whether you are at high risk of being infected with HIV. If you choose to begin PrEP, you should first be tested for HIV. You should then be tested every 3 months for as long as you are taking PrEP. Pregnancy  If you are premenopausal and you may become pregnant, ask your health care provider about preconception counseling.  If you may become pregnant, take 400 to 800 micrograms (mcg) of folic acid every day.  If you want to prevent pregnancy, talk to your health care provider about birth control (contraception). Osteoporosis and menopause  Osteoporosis is a disease in which the bones lose minerals and strength with aging. This can result in serious bone fractures. Your risk for osteoporosis can be identified using a bone density scan.  If you are 10 years of age or older, or if you are at risk for osteoporosis and fractures, ask your health care provider if you should be screened.  Ask your health care provider whether you should take a calcium or vitamin D supplement to lower your risk  for osteoporosis.  Menopause may have certain physical symptoms and risks.  Hormone replacement therapy may reduce some of these symptoms and risks. Talk to your health care provider about whether hormone replacement therapy is right for you. Follow these instructions at home:  Schedule regular health, dental, and eye exams.  Stay current with your immunizations.  Do not use any tobacco products including cigarettes, chewing tobacco, or electronic cigarettes.  If you are pregnant, do not drink alcohol.  If you are breastfeeding, limit how much and how often you drink alcohol.  Limit alcohol intake to no more than 1 drink per day for nonpregnant women. One drink equals 12 ounces of beer, 5 ounces of wine, or 1 ounces of hard liquor.  Do not use street drugs.  Do not share needles.  Ask your health care provider for help if you need support or information about quitting drugs.  Tell your health care provider if you often feel depressed.  Tell your health care provider if you have ever been abused or do not feel safe at home. This information is not intended to replace advice given to you by your health care provider. Make sure you discuss any questions you have with your health care provider. Document Released: 02/04/2011 Document Revised: 12/28/2015 Document Reviewed: 04/25/2015 Elsevier Interactive Patient Education  2019 [employer].

## 2018-09-17 NOTE — Progress Notes (Signed)
Subjective:   Michaela [name] is a 68 y.o. female who presents for Medicare Annual (Subsequent) preventive examination.  Review of Systems:  No ROS.  Medicare Wellness Visit. Additional risk factors are reflected in the social history.  Cardiac Risk Factors include: advanced age (>46mn, >>32women) Sleep patterns: feels rested on waking, does not get up to void and sleeps 8 hours nightly.    Home Safety/Smoke Alarms: Feels safe in home. Smoke alarms in place.  Living environment; residence and Firearm Safety: 1-story house/ trailer. No use or need for DME at this time.  Seat Belt Safety/Bike Helmet: Wears seat belt.   Female:   Pap-  N/A d/t age     M1 09/2018 per pt. Report. Awaiting report from [facility]. Will re-confirm with [facility].     Dexa scan- 10/2016, due 10/2018. Order placed       CCS- 04/2013, due 04/2023     Objective:     Vitals: BP 132/76 (BP Location: Right Arm, Patient Position: Sitting, Cuff Size: Normal)   Pulse (!) 54   Temp 98.4 F (36.9 C) (Oral)   Resp 16   Ht '5\' 3"'$  (1.6 m)   Wt 152 lb (68.9 kg)   SpO2 98%   BMI 26.93 kg/m   Body mass index is 26.93 kg/m.  Advanced Directives 09/17/2018 09/16/2017 07/21/2017 06/16/2017  Does Patient Have a Medical Advance Directive? Yes Yes Yes Yes  Type of AParamedicof ASylvaLiving will - HElsinoreLiving will Living will  Does patient want to make changes to medical advance directive? - - No - Patient declined No - Patient declined  Copy of HPrincetonin Chart? No - copy requested - No - copy requested -    Tobacco Social History   Tobacco Use  Smoking Status Former Smoker  . Last attempt to quit: 10/03/1981  . Years since quitting: 36.9  Smokeless Tobacco Never Used  Tobacco Comment   smoked remotely for 10 years, [place] Answered Comment: smoked remotely for 10 years, quit in 1983    Past Medical History:    Diagnosis Date  . Allergy   . Arthritis    osteoarthritis  . Foot deformity    saw podiatrist remotely  . Heel spur   . Hyperlipidemia   . Hypertension   . Migraine    Past Surgical History:  Procedure Laterality Date  . no prior surgery     Family History  Problem Relation Age of Onset  . Heart disease Mother   . Stroke Mother   . Early death Father        accidental death - trauma  . Colon cancer Paternal Uncle 765 . Stomach cancer Neg Hx    Social History   Socioeconomic History  . Marital status: Married    Spouse name: Not on file  . Number of children: 0  . Years of education: Not on file  . Highest education level: Not on file  Occupational History  . 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 Financial resource strain: Not hard at all  . Food insecurity:    Worry: Never true    Inability: Never true  . Transportation needs:    Medical: No    Non-medical: No  Tobacco Use  . Smoking status: Former Smoker    Last attempt to quit: 10/03/1981  Years since quitting: 36.9  . Smokeless tobacco: Never Used  . Tobacco comment: smoked remotely for 10 years, quit in 1983  Substance and Sexual Activity  . Alcohol use: Yes    Alcohol/week: 1.0 standard drinks    Types: 1 Glasses of wine per week    Frequency: Never  . Drug use: No  . Sexual activity: Yes  Lifestyle  . Physical activity:    Days per week: 3 days    Minutes per session: 30 min  . Stress: Not at all  Relationships  . Social connections:    Talks on phone: More than three times a week    Gets together: More than three times a week    Attends religious service: Never    Active member of club or organization: Yes    Attends meetings of clubs or organizations: More than 4 times per year    Relationship status: Married  Other Topics Concern  . 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 atorvastatin (LIPITOR) 40 MG tablet TAKE ONE TABLET BY MOUTH TWO TIMES PER WEEK  . fexofenadine (ALLEGRA) 180 MG tablet Take 180 mg daily as needed by mouth for allergies or rhinitis.  . fluticasone (FLONASE) 50 MCG/ACT nasal spray SHAKE WELL BEFORE EACH USE AND INSTILL 1 TO 2 SPRAYS INTO EACH NOSTRIL ONCE DAILY  . losartan (COZAAR) 50 MG tablet TAKE ONE TABLET BY MOUTH DAILY  . tretinoin (RETIN-A) 0.025 % cream APPLY ONCE DAILY TO FACE AS DIRECTED   No facility-administered encounter medications on file as of 09/17/2018.     Activities of Daily Living In your present state of health, do you have any difficulty performing the following activities: 09/17/2018  Hearing? N  Vision? N  Difficulty concentrating or making decisions? N  Walking or climbing stairs? Y  Comment some occassional gait instability. Has had extensive vertigo work-up in recent past. PCP aware.  Dressing or bathing? N  Doing errands, shopping? N  Preparing Food and eating ? N  Using the Toilet? N  In the past six months, have you accidently leaked urine? N  Do you have problems with loss of bowel control? N  Managing your Medications? N  Managing your Finances? N  Housekeeping or managing your Housekeeping? N  Some recent data might be hidden    Patient Care Team: Lucretia Kern, DO as PCP - General (Family Medicine) Calvert Cantor, MD as Consulting Physician (Ophthalmology)    Assessment:   This is a routine wellness examination for Michaela [name]. Physical assessment deferred to PCP.   Exercise Activities and Dietary  recommendations Current Exercise Habits: The patient does not participate in regular exercise at present, Exercise limited by: neurologic condition(s);orthopedic condition(s) Diet (meal preparation, eat out, water intake, caffeinated beverages, dairy products, fruits and vegetables): in general, a "healthy" diet         Goals    . Exercise  150 min/wk Moderate Activity     Will continue your diet and try Tai chi    . Patient Stated     Stay healthy, keep travel plans for [place]!  Increase walking daily!  Incorporate Vitamin D into med regimine for fracture prevention!       Fall Risk Fall Risk  09/17/2018 09/16/2017 09/16/2017 09/05/2016  Falls in the past year? 1 No No No  Number falls in past yr: 0 - - -  Injury with Fall? 0 - - -  Risk for fall due to : Impaired balance/gait - - -  Follow up Education provided;Falls prevention discussed - - -    Depression Screen PHQ 2/9 Scores 09/17/2018 09/16/2017 09/16/2017 09/05/2016  PHQ - 2 Score 0 0 0 0  PHQ- 9 Score 0 - - -     Cognitive Function MMSE - Mini Mental State Exam 09/16/2017  Not completed: (No Data)       Ad8 score reviewed for issues:  Issues making decisions: no  Less interest in hobbies / activities: no  Repeats questions, stories (family complaining): no  Trouble using ordinary gadgets (microwave, computer, phone):no  Forgets the month or year: no  Mismanaging finances: no  Remembering appts: no  Daily problems with thinking and/or memory: no Ad8 score is= 0    Immunization History  Administered Date(s) Administered  . Influenza Split 04/15/2012  . Influenza Whole 08/05/2005, 05/10/2009, 05/31/2010  . Influenza, High Dose Seasonal PF 05/16/2017, 05/18/2018  . Influenza,inj,Quad PF,6+ Mos 04/12/2013, 05/04/2014, 05/11/2015, 05/23/2016  . Pneumococcal Conjugate-13 07/26/2014  . Pneumococcal Polysaccharide-23 08/05/2005, 09/05/2016  . Td 03/05/1997, 12/23/2005  . Tdap 02/15/2016  . Zoster 01/09/2012    . Zoster Recombinat (Shingrix) 12/09/2017, 03/03/2018    Qualifies for Shingles Vaccine? Already received full series of shingrix.  Screening Tests Health Maintenance  Topic Date Due  . MAMMOGRAM  09/05/2018  . DEXA SCAN  10/11/2018  . COLONOSCOPY  04/17/2023  . TETANUS/TDAP  02/14/2026  . INFLUENZA VACCINE  Completed  . Hepatitis C Screening  Completed  . PNA vac Low Risk Adult  Completed        Plan:     Bring a copy of your living will and/or healthcare power of attorney to your next office visit.  Follow-up with Dr. Maudie Mercury about need for vaccines (particularly Hep A), or need for titers prior to travel in [place]  Follow-up with Dr. Maudie Mercury about gait instability concern, and R arm pain/numbness.  Keep staying physically and mentally active! I have personally reviewed and noted the following in the patient's chart:   . Medical and social history . Use of alcohol, tobacco or illicit drugs  . Current medications and supplements . Functional ability and status . Nutritional status . Physical activity . Advanced directives . List of other physicians . Vitals . Screenings to include cognitive, depression, and falls . Referrals and appointments  In addition, I have reviewed and discussed with patient certain preventive protocols, quality metrics, and best practice recommendations. A written personalized care plan for preventive services as well as general preventive health recommendations were provided to patient.     Alphia Moh, RN  09/17/2018

## 2018-09-17 NOTE — Patient Instructions (Signed)
BEFORE YOU LEAVE: -rotator cuff exercises -labs -follow up: 1 month if any shoulder pain persists; 4-6 months otherwise  Do the exercises for the shoulder  Ice as needed  Topical menthol if needed for pain (tiger balm), tylenol or aleve per instructions only if needed for pain.  I hope the shoulder is feeling better soon. Follow up in 1 month if persists, sooner if worsening.  We have ordered labs or studies at this visit. It can take up to 1-2 weeks for results and processing. IF results require follow up or explanation, we will call you with instructions. Clinically stable results will be released to your [facility]. If you have not heard from [place] or cannot find your results in [facility] in 2 weeks please contact our office at [phone].  If you are not yet signed up for [facility], please consider signing up.   We recommend the following healthy lifestyle for LIFE: 1) Small portions. But, make sure to get regular (at least 3 per day), healthy meals and small healthy snacks if needed.  2) Eat a healthy clean diet.   TRY TO EAT: -at least 5-7 servings of low sugar, colorful, and nutrient rich vegetables per day (not corn, potatoes or bananas.) -berries are the best choice if you wish to eat fruit (only eat small amounts if trying to reduce weight)  -lean meets (fish, white meat of chicken or [place]) -vegan proteins for some meals - beans or tofu, whole grains, nuts and seeds -Replace bad fats with good fats - good fats include: fish, nuts and seeds, canola oil, olive oil -small amounts of low fat or non fat dairy -small amounts of100 % whole grains - check the lables -drink plenty of water  AVOID: -SUGAR, sweets, anything with added sugar, corn syrup or sweeteners - must read labels as even foods advertised as "healthy" often are loaded with sugar -if you must have a sweetener, small amounts of stevia may be best -sweetened beverages and artificially sweetened beverages -simple  starches (rice, bread, potatoes, pasta, chips, etc - small amounts of 100% whole grains are ok) -red meat, pork, butter -fried foods, fast food, processed food, excessive dairy, eggs and coconut.  3)Get at least 150 minutes of sweaty aerobic exercise per week.  4)Reduce stress - consider counseling, meditation and relaxation to balance other aspects of your life.

## 2018-09-29 ENCOUNTER — Inpatient Hospital Stay: Admission: RE | Admit: 2018-09-29 | Payer: Medicare Other | Source: Ambulatory Visit

## 2018-09-30 ENCOUNTER — Encounter: Payer: Self-pay | Admitting: Family Medicine

## 2018-10-05 ENCOUNTER — Other Ambulatory Visit: Payer: Self-pay | Admitting: Family Medicine

## 2018-10-09 ENCOUNTER — Other Ambulatory Visit: Payer: Self-pay | Admitting: Family Medicine

## 2018-10-20 ENCOUNTER — Inpatient Hospital Stay: Admission: RE | Admit: 2018-10-20 | Payer: Medicare Other | Source: Ambulatory Visit

## 2019-01-18 ENCOUNTER — Other Ambulatory Visit: Payer: Medicare Other

## 2019-01-18 ENCOUNTER — Ambulatory Visit: Payer: Medicare Other | Admitting: Family Medicine

## 2019-01-19 ENCOUNTER — Other Ambulatory Visit: Payer: Self-pay

## 2019-01-19 ENCOUNTER — Ambulatory Visit (INDEPENDENT_AMBULATORY_CARE_PROVIDER_SITE_OTHER): Payer: Medicare Other | Admitting: Internal Medicine

## 2019-01-19 DIAGNOSIS — I10 Essential (primary) hypertension: Secondary | ICD-10-CM | POA: Diagnosis not present

## 2019-01-19 DIAGNOSIS — R2689 Other abnormalities of gait and mobility: Secondary | ICD-10-CM

## 2019-01-19 DIAGNOSIS — R42 Dizziness and giddiness: Secondary | ICD-10-CM | POA: Diagnosis not present

## 2019-01-19 DIAGNOSIS — E785 Hyperlipidemia, unspecified: Secondary | ICD-10-CM | POA: Diagnosis not present

## 2019-01-19 NOTE — Progress Notes (Signed)
Virtual Visit via Telephone Note  I connected with Michaela [name] on 01/19/19 at  9:30 AM EDT by telephone and verified that I am speaking with the correct person using two identifiers.   I discussed the limitations, risks, security and privacy concerns of performing an evaluation and management service by telephone and the availability of in person appointments. I also discussed with the patient that there may be a patient responsible charge related to this service. The patient expressed understanding and agreed to proceed.  We initially attempted to connect via video chat but were unable to due to technical difficulties on the patient's end, so we converted this visit to a phone visit.  Location patient: home Location provider: work office Participants present for the call: patient, provider Patient did not have a visit in the prior 7 days to address this/these issue(s).   History of Present Illness:  This is a scheduled visit to establish care and to discuss chronic medical conditions. She has no acute complaints today. PMH is significant for:  1. HTN that has been well controlled on Losartan. She has no measurements to share with me today.  2. HLD on lipitor.  3. She states she was diagnosed with acute labyrinthitis a few years ago and it was "the most awful thing that has ever happened to me". She would like to make sure it never happens again. She sometimes has vertigo with head positioning, but mostly she continues to have issues with balance.    Observations/Objective: Patient sounds cheerful and well on the phone. I do not appreciate any increased work of breathing. Speech and thought processing are grossly intact. Patient reported vitals: none reported   Current Outpatient Medications:  .  atorvastatin (LIPITOR) 40 MG tablet, TAKE ONE TABLET BY MOUTH TWO TIMES PER WEEK, Disp: 26 tablet, Rfl: 2 .  fexofenadine (ALLEGRA) 180 MG tablet, Take 180 mg daily as needed by  mouth for allergies or rhinitis., Disp: , Rfl:  .  fluticasone (FLONASE) 50 MCG/ACT nasal spray, SHAKE WELL BEFORE EACH USE AND SPRAY ONE TO  TWO SPRAYS IN EACH NOSTRIL ONCE DAILY, Disp: 16 g, Rfl: 4 .  losartan (COZAAR) 50 MG tablet, TAKE ONE TABLET BY MOUTH DAILY, Disp: 90 tablet, Rfl: 1 .  tretinoin (RETIN-A) 0.025 % cream, APPLY ONCE DAILY TO FACE AS DIRECTED, Disp: 45 g, Rfl: 3  Review of Systems:  Constitutional: Denies fever, chills, diaphoresis, appetite change and fatigue.  HEENT: Denies photophobia, eye pain, redness, hearing loss, ear pain, congestion, sore throat, rhinorrhea, sneezing, mouth sores, trouble swallowing, neck pain, neck stiffness and tinnitus.   Respiratory: Denies SOB, DOE, cough, chest tightness,  and wheezing.   Cardiovascular: Denies chest pain, palpitations and leg swelling.  Gastrointestinal: Denies nausea, vomiting, abdominal pain, diarrhea, constipation, blood in stool and abdominal distention.  Genitourinary: Denies dysuria, urgency, frequency, hematuria, flank pain and difficulty urinating.  Endocrine: Denies: hot or cold intolerance, sweats, changes in hair or nails, polyuria, polydipsia. Musculoskeletal: Denies myalgias, back pain, joint swelling, arthralgias. Skin: Denies pallor, rash and wound.  Neurological: Denies seizures, syncope, weakness, light-headedness, numbness and headaches.  Hematological: Denies adenopathy. Easy bruising, personal or family bleeding history  Psychiatric/Behavioral: Denies suicidal ideation, mood changes, confusion, nervousness, sleep disturbance and agitation   Assessment and Plan:  Hyperlipidemia, unspecified hyperlipidemia type -Continue lipitor. -Last LDL 101 in 2/20.  Essential hypertension -Has been well controlled in past. -Continue losartan.  Vertigo  Balance disorder  -By her description, sounds more like  BPPV as it happens with head turning to right. -She did vestibular PT and knows how to do the Epley  maneuver. -Balance issues sound very suspicious for B12 deficiency. Also possibly hypothyroidism, altho less likely. -Will schedule lab appointment for B12 and TSH.    I discussed the assessment and treatment plan with the patient. The patient was provided an opportunity to ask questions and all were answered. The patient agreed with the plan and demonstrated an understanding of the instructions.   The patient was advised to call back or seek an in-person evaluation if the symptoms worsen or if the condition fails to improve as anticipated.  I provided 22 minutes of non-face-to-face time during this encounter.   Michaela JanEstela Hernandez Acosta, MD [facility]

## 2019-01-22 ENCOUNTER — Encounter: Payer: Self-pay | Admitting: Internal Medicine

## 2019-01-24 ENCOUNTER — Other Ambulatory Visit: Payer: Self-pay | Admitting: Family Medicine

## 2019-02-08 ENCOUNTER — Other Ambulatory Visit (INDEPENDENT_AMBULATORY_CARE_PROVIDER_SITE_OTHER): Payer: Medicare Other

## 2019-02-08 ENCOUNTER — Other Ambulatory Visit: Payer: Self-pay

## 2019-02-08 DIAGNOSIS — R42 Dizziness and giddiness: Secondary | ICD-10-CM | POA: Diagnosis not present

## 2019-02-08 DIAGNOSIS — R2689 Other abnormalities of gait and mobility: Secondary | ICD-10-CM

## 2019-02-08 LAB — TSH: TSH: 2.55 u[IU]/mL (ref 0.35–4.50)

## 2019-02-08 LAB — VITAMIN B12: Vitamin B-12: 348 pg/mL (ref 211–911)

## 2019-02-10 ENCOUNTER — Encounter: Payer: Self-pay | Admitting: Internal Medicine

## 2019-04-28 ENCOUNTER — Other Ambulatory Visit: Payer: Self-pay | Admitting: Family Medicine

## 2019-05-13 ENCOUNTER — Ambulatory Visit (INDEPENDENT_AMBULATORY_CARE_PROVIDER_SITE_OTHER): Payer: Medicare Other

## 2019-05-13 ENCOUNTER — Encounter: Payer: Self-pay | Admitting: Internal Medicine

## 2019-05-13 ENCOUNTER — Other Ambulatory Visit: Payer: Self-pay

## 2019-05-13 DIAGNOSIS — Z23 Encounter for immunization: Secondary | ICD-10-CM

## 2019-05-27 ENCOUNTER — Ambulatory Visit: Payer: Medicare Other

## 2019-07-15 ENCOUNTER — Other Ambulatory Visit: Payer: Self-pay | Admitting: Internal Medicine

## 2019-09-04 ENCOUNTER — Ambulatory Visit: Payer: Medicare Other

## 2019-09-09 ENCOUNTER — Ambulatory Visit: Payer: Medicare Other

## 2019-09-11 ENCOUNTER — Ambulatory Visit: Payer: Medicare PPO | Attending: Internal Medicine

## 2019-09-11 DIAGNOSIS — Z23 Encounter for immunization: Secondary | ICD-10-CM | POA: Insufficient documentation

## 2019-09-11 NOTE — Progress Notes (Signed)
   Covid-19 Vaccination Clinic  Name:  JACQUILINE ZURCHER    MRN: [id number] DOB: [birth year]  09/11/2019  Ms. Manahan was observed post Covid-19 immunization for 15 minutes without incidence. She was provided with Vaccine Information Sheet and instruction to access the V-Safe system.   Ms. Gin was instructed to call 911 with any severe reactions post vaccine: Marland Kitchen Difficulty breathing  . Swelling of your face and throat  . A fast heartbeat  . A bad rash all over your body  . Dizziness and weakness    Immunizations Administered    Name Date Dose VIS Date Route   Pfizer COVID-19 Vaccine 09/11/2019  5:37 PM 0.3 mL 07/16/2019 Intramuscular   Manufacturer: [employer], [employer]   Lot: [id number]   NDC: [address]-5

## 2019-10-05 ENCOUNTER — Ambulatory Visit: Payer: Medicare PPO | Attending: Internal Medicine

## 2019-10-05 DIAGNOSIS — Z23 Encounter for immunization: Secondary | ICD-10-CM

## 2019-10-05 NOTE — Progress Notes (Signed)
   Covid-19 Vaccination Clinic  Name:  Michaela [name]    MRN: [id number] DOB: May 01, 1951  10/05/2019  [name] was observed post Covid-19 immunization for 15 minutes without incident. She was provided with Vaccine Information Sheet and instruction to access the V-Safe system.   [name] was instructed to call 911 with any severe reactions post vaccine: Marland Kitchen Difficulty breathing  . Swelling of face and throat  . A fast heartbeat  . A bad rash all over body  . Dizziness and weakness   Immunizations Administered    Name Date Dose VIS Date Route   Pfizer COVID-19 Vaccine 10/05/2019 10:20 AM 0.3 mL 07/16/2019 Intramuscular   Manufacturer: [employer], [employer]   Lot: [id number]   NDC: [address]-2

## 2019-10-13 ENCOUNTER — Other Ambulatory Visit: Payer: Self-pay | Admitting: Family Medicine

## 2019-10-13 ENCOUNTER — Other Ambulatory Visit: Payer: Self-pay | Admitting: Internal Medicine

## 2019-11-10 ENCOUNTER — Encounter: Payer: Self-pay | Admitting: Internal Medicine

## 2019-11-10 ENCOUNTER — Other Ambulatory Visit: Payer: Self-pay

## 2019-11-10 DIAGNOSIS — Z1231 Encounter for screening mammogram for malignant neoplasm of breast: Secondary | ICD-10-CM | POA: Diagnosis not present

## 2019-11-10 LAB — HM MAMMOGRAPHY

## 2019-11-11 ENCOUNTER — Ambulatory Visit (INDEPENDENT_AMBULATORY_CARE_PROVIDER_SITE_OTHER): Payer: Medicare PPO | Admitting: Internal Medicine

## 2019-11-11 ENCOUNTER — Encounter: Payer: Self-pay | Admitting: Internal Medicine

## 2019-11-11 VITALS — BP 170/90 | HR 66 | Temp 98.1°F | Wt 157.9 lb

## 2019-11-11 DIAGNOSIS — G5601 Carpal tunnel syndrome, right upper limb: Secondary | ICD-10-CM

## 2019-11-11 DIAGNOSIS — I10 Essential (primary) hypertension: Secondary | ICD-10-CM

## 2019-11-11 DIAGNOSIS — M8580 Other specified disorders of bone density and structure, unspecified site: Secondary | ICD-10-CM | POA: Diagnosis not present

## 2019-11-11 DIAGNOSIS — E785 Hyperlipidemia, unspecified: Secondary | ICD-10-CM

## 2019-11-11 MED ORDER — LOSARTAN POTASSIUM 50 MG PO TABS: 50.0000 mg | ORAL_TABLET | Freq: Every day | ORAL | 0 refills | Status: DC

## 2019-11-11 MED ORDER — ATORVASTATIN CALCIUM 40 MG PO TABS: 40.0000 mg | ORAL_TABLET | Freq: Every day | ORAL | 0 refills | Status: DC

## 2019-11-11 NOTE — Patient Instructions (Signed)
-Nice seeing you today!!  -Lab work today; will notify you once results are available.  -Please schedule follow up in 6-8 weeks for your physical and to check your blood pressure. Please come in fasting.  -Low salt diet (see below).  -Check BP at home 2-3 times per week and bring in those numbers to your next visit.   DASH Eating Plan DASH stands for "Dietary Approaches to Stop Hypertension." The DASH eating plan is a healthy eating plan that has been shown to reduce high blood pressure (hypertension). It may also reduce your risk for type 2 diabetes, heart disease, and stroke. The DASH eating plan may also help with weight loss. What are tips for following this plan?  General guidelines  Avoid eating more than 2,300 mg (milligrams) of salt (sodium) a day. If you have hypertension, you may need to reduce your sodium intake to 1,500 mg a day.  Limit alcohol intake to no more than 1 drink a day for nonpregnant women and 2 drinks a day for men. One drink equals 12 oz of beer, 5 oz of wine, or 1 oz of hard liquor.  Work with your health care provider to maintain a healthy body weight or to lose weight. Ask what an ideal weight is for you.  Get at least 30 minutes of exercise that causes your heart to beat faster (aerobic exercise) most days of the week. Activities may include walking, swimming, or biking.  Work with your health care provider or diet and nutrition specialist (dietitian) to adjust your eating plan to your individual calorie needs. Reading food labels   Check food labels for the amount of sodium per serving. Choose foods with less than 5 percent of the Daily Value of sodium. Generally, foods with less than 300 mg of sodium per serving fit into this eating plan.  To find whole grains, look for the word "whole" as the first word in the ingredient list. Shopping  Buy products labeled as "low-sodium" or "no salt added."  Buy fresh foods. Avoid canned foods and premade or  frozen meals. Cooking  Avoid adding salt when cooking. Use salt-free seasonings or herbs instead of table salt or sea salt. Check with your health care provider or pharmacist before using salt substitutes.  Do not fry foods. Cook foods using healthy methods such as baking, boiling, grilling, and broiling instead.  Cook with heart-healthy oils, such as olive, canola, soybean, or sunflower oil. Meal planning  Eat a balanced diet that includes: ? 5 or more servings of fruits and vegetables each day. At each meal, try to fill half of your plate with fruits and vegetables. ? Up to 6-8 servings of whole grains each day. ? Less than 6 oz of lean meat, poultry, or fish each day. A 3-oz serving of meat is about the same size as a deck of cards. One egg equals 1 oz. ? 2 servings of low-fat dairy each day. ? A serving of nuts, seeds, or beans 5 times each week. ? Heart-healthy fats. Healthy fats called Omega-3 fatty acids are found in foods such as flaxseeds and coldwater fish, like sardines, salmon, and mackerel.  Limit how much you eat of the following: ? Canned or prepackaged foods. ? Food that is high in trans fat, such as fried foods. ? Food that is high in saturated fat, such as fatty meat. ? Sweets, desserts, sugary drinks, and other foods with added sugar. ? Full-fat dairy products.  Do not salt foods before  eating.  Try to eat at least 2 vegetarian meals each week.  Eat more home-cooked food and less restaurant, buffet, and fast food.  When eating at a restaurant, ask that your food be prepared with less salt or no salt, if possible. What foods are recommended? The items listed may not be a complete list. Talk with your dietitian about what dietary choices are best for you. Grains Whole-grain or whole-wheat bread. Whole-grain or whole-wheat pasta. Brown rice. [place]. Bulgur. Whole-grain and low-sodium cereals. Pita bread. Low-fat, low-sodium crackers. Whole-wheat flour  tortillas. Vegetables Fresh or frozen vegetables (raw, steamed, roasted, or grilled). Low-sodium or reduced-sodium tomato and vegetable juice. Low-sodium or reduced-sodium tomato sauce and tomato paste. Low-sodium or reduced-sodium canned vegetables. Fruits All fresh, dried, or frozen fruit. Canned fruit in natural juice (without added sugar). Meat and other protein foods Skinless chicken or [place]. Ground chicken or [place]. Pork with fat trimmed off. Fish and seafood. Egg whites. Dried beans, peas, or lentils. Unsalted nuts, nut butters, and seeds. Unsalted canned beans. Lean cuts of beef with fat trimmed off. Low-sodium, lean deli meat. Dairy Low-fat (1%) or fat-free (skim) milk. Fat-free, low-fat, or reduced-fat cheeses. Nonfat, low-sodium ricotta or cottage cheese. Low-fat or nonfat yogurt. Low-fat, low-sodium cheese. Fats and oils Soft margarine without trans fats. Vegetable oil. Low-fat, reduced-fat, or light mayonnaise and salad dressings (reduced-sodium). Canola, safflower, olive, soybean, and sunflower oils. Avocado. Seasoning and other foods Herbs. Spices. Seasoning mixes without salt. Unsalted popcorn and pretzels. Fat-free sweets. What foods are not recommended? The items listed may not be a complete list. Talk with your dietitian about what dietary choices are best for you. Grains Baked goods made with fat, such as croissants, muffins, or some breads. Dry pasta or rice meal packs. Vegetables Creamed or fried vegetables. Vegetables in a cheese sauce. Regular canned vegetables (not low-sodium or reduced-sodium). Regular canned tomato sauce and paste (not low-sodium or reduced-sodium). Regular tomato and vegetable juice (not low-sodium or reduced-sodium). Angie Fava. Olives. Fruits Canned fruit in a light or heavy syrup. Fried fruit. Fruit in cream or butter sauce. Meat and other protein foods Fatty cuts of meat. Ribs. Fried meat. Berniece Salines. Sausage. Bologna and other processed lunch meats.  Salami. Fatback. Hotdogs. Bratwurst. Salted nuts and seeds. Canned beans with added salt. Canned or smoked fish. Whole eggs or egg yolks. Chicken or [place] with skin. Dairy Whole or 2% milk, cream, and half-and-half. Whole or full-fat cream cheese. Whole-fat or sweetened yogurt. Full-fat cheese. Nondairy creamers. Whipped toppings. Processed cheese and cheese spreads. Fats and oils Butter. Stick margarine. Lard. Shortening. Ghee. Bacon fat. Tropical oils, such as coconut, palm kernel, or palm oil. Seasoning and other foods Salted popcorn and pretzels. Onion salt, garlic salt, seasoned salt, table salt, and sea salt. Worcestershire sauce. Tartar sauce. Barbecue sauce. Teriyaki sauce. Soy sauce, including reduced-sodium. Steak sauce. Canned and packaged gravies. Fish sauce. Oyster sauce. Cocktail sauce. Horseradish that you find on the shelf. Ketchup. Mustard. Meat flavorings and tenderizers. Bouillon cubes. Hot sauce and Tabasco sauce. Premade or packaged marinades. Premade or packaged taco seasonings. Relishes. Regular salad dressings. Where to find more information:  National Heart, Lung, and [place]: https://wilson-eaton.com/  American Heart Association: www.heart.org Summary  The DASH eating plan is a healthy eating plan that has been shown to reduce high blood pressure (hypertension). It may also reduce your risk for type 2 diabetes, heart disease, and stroke.  With the DASH eating plan, you should limit salt (sodium) intake to 2,300 mg a  day. If you have hypertension, you may need to reduce your sodium intake to 1,500 mg a day.  When on the DASH eating plan, aim to eat more fresh fruits and vegetables, whole grains, lean proteins, low-fat dairy, and heart-healthy fats.  Work with your health care provider or diet and nutrition specialist (dietitian) to adjust your eating plan to your individual calorie needs. This information is not intended to replace advice given to you by your health  care provider. Make sure you discuss any questions you have with your health care provider. Document Revised: 07/04/2017 Document Reviewed: 07/15/2016 Elsevier Patient Education  2020 [employer].

## 2019-11-11 NOTE — Progress Notes (Signed)
Established Patient Office Visit     This visit occurred during the SARS-CoV-2 public health emergency.  Safety protocols were in place, including screening questions prior to the visit, additional usage of staff PPE, and extensive cleaning of exam room while observing appropriate contact time as indicated for disinfecting solutions.    CC/Reason for Visit: Medication refills, follow-up chronic medical conditions  HPI: Michaela [name] is a 69 y.o. female who is coming in today for the above mentioned reasons. Past Medical History is significant for: Hypertension hyperlipidemia that have been well controlled in the past.  She is here as the medications were denied as she has not been seen in a year.   She does not check her blood pressure routinely.  She has been having tingling in her thumb and index finger, she has noticed weakness adopting her son and wasting of the thenar eminence, she has also noticed pain of her flexor muscles in the forearm.  She gardens quite a bit and has been having difficulty with this.   Past Medical/Surgical History: Past Medical History:  Diagnosis Date  . Allergy   . Arthritis    osteoarthritis  . Foot deformity    saw podiatrist remotely  . Heel spur   . Hyperlipidemia   . Hypertension   . Migraine     Past Surgical History:  Procedure Laterality Date  . no prior surgery      Social History:  reports that she quit smoking about 38 years ago. She has never used smokeless tobacco. She reports current alcohol use of about 1.0 standard drinks of alcohol per week. She reports that she does not use drugs.  Allergies: Allergies  Allergen Reactions  . Sulfonamide Derivatives Hives    Family History:  Family History  Problem Relation Age of Onset  . Heart disease Mother   . Stroke Mother   . Early death Father        accidental death - trauma  . Colon cancer Paternal Uncle 49  . Stomach cancer Neg Hx      Current Outpatient Medications:   .  atorvastatin (LIPITOR) 40 MG tablet, Take 1 tablet (40 mg total) by mouth daily at 6 PM., Disp: 90 tablet, Rfl: 0 .  fexofenadine (ALLEGRA) 180 MG tablet, Take 180 mg daily as needed by mouth for allergies or rhinitis., Disp: , Rfl:  .  fluticasone (FLONASE) 50 MCG/ACT nasal spray, SHAKE WELL BEFORE EACH USE AND SPRAY ONE TO TWO SPRAYS IN EACH NOSTRIL ONCE DAILY, Disp: 16 mL, Rfl: 1 .  losartan (COZAAR) 50 MG tablet, Take 1 tablet (50 mg total) by mouth daily., Disp: 90 tablet, Rfl: 0 .  tretinoin (RETIN-A) 0.025 % cream, APPLY ONCE DAILY TO FACE AS DIRECTED, Disp: 45 g, Rfl: 3  Review of Systems:  Constitutional: Denies fever, chills, diaphoresis, appetite change and fatigue.  HEENT: Denies photophobia, eye pain, redness, hearing loss, ear pain, congestion, sore throat, rhinorrhea, sneezing, mouth sores, trouble swallowing, neck pain, neck stiffness and tinnitus.   Respiratory: Denies SOB, DOE, cough, chest tightness,  and wheezing.   Cardiovascular: Denies chest pain, palpitations and leg swelling.  Gastrointestinal: Denies nausea, vomiting, abdominal pain, diarrhea, constipation, blood in stool and abdominal distention.  Genitourinary: Denies dysuria, urgency, frequency, hematuria, flank pain and difficulty urinating.  Endocrine: Denies: hot or cold intolerance, sweats, changes in hair or nails, polyuria, polydipsia. Musculoskeletal: Denies myalgias, back pain, joint swelling, arthralgias and gait problem.  Skin: Denies pallor, rash  and wound.  Neurological: Denies dizziness, seizures, syncope, weakness, light-headedness, numbness and headaches.  Hematological: Denies adenopathy. Easy bruising, personal or family bleeding history  Psychiatric/Behavioral: Denies suicidal ideation, mood changes, confusion, nervousness, sleep disturbance and agitation    Physical Exam: Vitals:   11/11/19 1520 11/11/19 1547  BP: (!) 150/80 (!) 170/90  Pulse: 66   Temp: 98.1 F (36.7 C)   TempSrc:  Temporal   SpO2: 96%   Weight: 157 lb 14.4 oz (71.6 kg)     Body mass index is 27.97 kg/m.   Constitutional: NAD, calm, comfortable Eyes: PERRL, lids and conjunctivae normal ENMT: Mucous membranes are moist.  Respiratory: clear to auscultation bilaterally, no wheezing, no crackles. Normal respiratory effort. No accessory muscle use.  Cardiovascular: Regular rate and rhythm, no murmurs / rubs / gallops. No extremity edema. Neurologic: Grossly intact and nonfocal Psychiatric: Normal judgment and insight. Alert and oriented x 3. Normal mood.    Impression and Plan:  Essential hypertension  -Uncontrolled, 2 measurements in office today have been 150/80 and 170/90. For now I will refill her losartan 50 mg, check basic metabolic profile to follow renal function and electrolytes. -She will do ambulatory blood pressure monitoring and return in 6 to 8 weeks for follow-up. -We have discussed low-salt diet and a handout on diet has been provided.  Osteopenia, unspecified location  - Plan: DG Bone Density  Hyperlipidemia, unspecified hyperlipidemia type  - Plan: atorvastatin (LIPITOR) 40 MG tablet -Last LDL was 101 in February 2020.  Right carpal tunnel syndrome -I suspect her symptoms are due to carpal tunnel. -We will try conservative management first with a wrist splint, if no improvement consider sports med versus work    Patient Instructions  -Nice seeing you today!!  -Lab work today; will notify you once results are available.  -Please schedule follow up in 6-8 weeks for your physical and to check your blood pressure. Please come in fasting.  -Low salt diet (see below).  -Check BP at home 2-3 times per week and bring in those numbers to your next visit.   DASH Eating Plan DASH stands for "Dietary Approaches to Stop Hypertension." The DASH eating plan is a healthy eating plan that has been shown to reduce high blood pressure (hypertension). It may also reduce your risk  for type 2 diabetes, heart disease, and stroke. The DASH eating plan may also help with weight loss. What are tips for following this plan?  General guidelines  Avoid eating more than 2,300 mg (milligrams) of salt (sodium) a day. If you have hypertension, you may need to reduce your sodium intake to 1,500 mg a day.  Limit alcohol intake to no more than 1 drink a day for nonpregnant women and 2 drinks a day for men. One drink equals 12 oz of beer, 5 oz of wine, or 1 oz of hard liquor.  Work with your health care provider to maintain a healthy body weight or to lose weight. Ask what an ideal weight is for you.  Get at least 30 minutes of exercise that causes your heart to beat faster (aerobic exercise) most days of the week. Activities may include walking, swimming, or biking.  Work with your health care provider or diet and nutrition specialist (dietitian) to adjust your eating plan to your individual calorie needs. Reading food labels   Check food labels for the amount of sodium per serving. Choose foods with less than 5 percent of the Daily Value of sodium. Generally, foods with  less than 300 mg of sodium per serving fit into this eating plan.  To find whole grains, look for the word "whole" as the first word in the ingredient list. Shopping  Buy products labeled as "low-sodium" or "no salt added."  Buy fresh foods. Avoid canned foods and premade or frozen meals. Cooking  Avoid adding salt when cooking. Use salt-free seasonings or herbs instead of table salt or sea salt. Check with your health care provider or pharmacist before using salt substitutes.  Do not fry foods. Cook foods using healthy methods such as baking, boiling, grilling, and broiling instead.  Cook with heart-healthy oils, such as olive, canola, soybean, or sunflower oil. Meal planning  Eat a balanced diet that includes: ? 5 or more servings of fruits and vegetables each day. At each meal, try to fill half of your  plate with fruits and vegetables. ? Up to 6-8 servings of whole grains each day. ? Less than 6 oz of lean meat, poultry, or fish each day. A 3-oz serving of meat is about the same size as a deck of cards. One egg equals 1 oz. ? 2 servings of low-fat dairy each day. ? A serving of nuts, seeds, or beans 5 times each week. ? Heart-healthy fats. Healthy fats called Omega-3 fatty acids are found in foods such as flaxseeds and coldwater fish, like sardines, salmon, and mackerel.  Limit how much you eat of the following: ? Canned or prepackaged foods. ? Food that is high in trans fat, such as fried foods. ? Food that is high in saturated fat, such as fatty meat. ? Sweets, desserts, sugary drinks, and other foods with added sugar. ? Full-fat dairy products.  Do not salt foods before eating.  Try to eat at least 2 vegetarian meals each week.  Eat more home-cooked food and less restaurant, buffet, and fast food.  When eating at a restaurant, ask that your food be prepared with less salt or no salt, if possible. What foods are recommended? The items listed may not be a complete list. Talk with your dietitian about what dietary choices are best for you. Grains Whole-grain or whole-wheat bread. Whole-grain or whole-wheat pasta. Brown rice. [place]. Bulgur. Whole-grain and low-sodium cereals. Pita bread. Low-fat, low-sodium crackers. Whole-wheat flour tortillas. Vegetables Fresh or frozen vegetables (raw, steamed, roasted, or grilled). Low-sodium or reduced-sodium tomato and vegetable juice. Low-sodium or reduced-sodium tomato sauce and tomato paste. Low-sodium or reduced-sodium canned vegetables. Fruits All fresh, dried, or frozen fruit. Canned fruit in natural juice (without added sugar). Meat and other protein foods Skinless chicken or [place]. Ground chicken or [place]. Pork with fat trimmed off. Fish and seafood. Egg whites. Dried beans, peas, or lentils. Unsalted nuts, nut butters, and  seeds. Unsalted canned beans. Lean cuts of beef with fat trimmed off. Low-sodium, lean deli meat. Dairy Low-fat (1%) or fat-free (skim) milk. Fat-free, low-fat, or reduced-fat cheeses. Nonfat, low-sodium ricotta or cottage cheese. Low-fat or nonfat yogurt. Low-fat, low-sodium cheese. Fats and oils Soft margarine without trans fats. Vegetable oil. Low-fat, reduced-fat, or light mayonnaise and salad dressings (reduced-sodium). Canola, safflower, olive, soybean, and sunflower oils. Avocado. Seasoning and other foods Herbs. Spices. Seasoning mixes without salt. Unsalted popcorn and pretzels. Fat-free sweets. What foods are not recommended? The items listed may not be a complete list. Talk with your dietitian about what dietary choices are best for you. Grains Baked goods made with fat, such as croissants, muffins, or some breads. Dry pasta or rice meal packs.  Vegetables Creamed or fried vegetables. Vegetables in a cheese sauce. Regular canned vegetables (not low-sodium or reduced-sodium). Regular canned tomato sauce and paste (not low-sodium or reduced-sodium). Regular tomato and vegetable juice (not low-sodium or reduced-sodium). Angie Fava. Olives. Fruits Canned fruit in a light or heavy syrup. Fried fruit. Fruit in cream or butter sauce. Meat and other protein foods Fatty cuts of meat. Ribs. Fried meat. Berniece Salines. Sausage. Bologna and other processed lunch meats. Salami. Fatback. Hotdogs. Bratwurst. Salted nuts and seeds. Canned beans with added salt. Canned or smoked fish. Whole eggs or egg yolks. Chicken or [place] with skin. Dairy Whole or 2% milk, cream, and half-and-half. Whole or full-fat cream cheese. Whole-fat or sweetened yogurt. Full-fat cheese. Nondairy creamers. Whipped toppings. Processed cheese and cheese spreads. Fats and oils Butter. Stick margarine. Lard. Shortening. Ghee. Bacon fat. Tropical oils, such as coconut, palm kernel, or palm oil. Seasoning and other foods Salted popcorn and  pretzels. Onion salt, garlic salt, seasoned salt, table salt, and sea salt. Worcestershire sauce. Tartar sauce. Barbecue sauce. Teriyaki sauce. Soy sauce, including reduced-sodium. Steak sauce. Canned and packaged gravies. Fish sauce. Oyster sauce. Cocktail sauce. Horseradish that you find on the shelf. Ketchup. Mustard. Meat flavorings and tenderizers. Bouillon cubes. Hot sauce and Tabasco sauce. Premade or packaged marinades. Premade or packaged taco seasonings. Relishes. Regular salad dressings. Where to find more information:  National Heart, Lung, and [place]: https://wilson-eaton.com/  American Heart Association: www.heart.org Summary  The DASH eating plan is a healthy eating plan that has been shown to reduce high blood pressure (hypertension). It may also reduce your risk for type 2 diabetes, heart disease, and stroke.  With the DASH eating plan, you should limit salt (sodium) intake to 2,300 mg a day. If you have hypertension, you may need to reduce your sodium intake to 1,500 mg a day.  When on the DASH eating plan, aim to eat more fresh fruits and vegetables, whole grains, lean proteins, low-fat dairy, and heart-healthy fats.  Work with your health care provider or diet and nutrition specialist (dietitian) to adjust your eating plan to your individual calorie needs. This information is not intended to replace advice given to you by your health care provider. Make sure you discuss any questions you have with your health care provider. Document Revised: 07/04/2017 Document Reviewed: 07/15/2016 Elsevier Patient Education  2020 [place], MD [facility]

## 2019-11-11 NOTE — Addendum Note (Signed)
Addended by: Philemon Kingdom on: 11/11/2019 04:16 PM   Modules accepted: Orders

## 2019-11-12 DIAGNOSIS — H43813 Vitreous degeneration, bilateral: Secondary | ICD-10-CM | POA: Diagnosis not present

## 2019-11-12 DIAGNOSIS — H2513 Age-related nuclear cataract, bilateral: Secondary | ICD-10-CM | POA: Diagnosis not present

## 2019-11-12 DIAGNOSIS — H35033 Hypertensive retinopathy, bilateral: Secondary | ICD-10-CM | POA: Diagnosis not present

## 2019-11-12 DIAGNOSIS — I69998 Other sequelae following unspecified cerebrovascular disease: Secondary | ICD-10-CM | POA: Diagnosis not present

## 2019-11-12 LAB — BASIC METABOLIC PANEL
BUN: 19 mg/dL (ref 6–23)
CO2: 25 mEq/L (ref 19–32)
Calcium: 9.1 mg/dL (ref 8.4–10.5)
Chloride: 106 mEq/L (ref 96–112)
Creatinine, Ser: 0.82 mg/dL (ref 0.40–1.20)
GFR: 69.24 mL/min (ref >60.00)
Glucose, Bld: 88 mg/dL (ref 70–99)
Potassium: 3.7 mEq/L (ref 3.5–5.1)
Sodium: 140 mEq/L (ref 135–145)

## 2019-11-16 ENCOUNTER — Encounter: Payer: Self-pay | Admitting: Internal Medicine

## 2020-01-10 ENCOUNTER — Other Ambulatory Visit: Payer: Self-pay

## 2020-01-10 ENCOUNTER — Ambulatory Visit (INDEPENDENT_AMBULATORY_CARE_PROVIDER_SITE_OTHER)
Admission: RE | Admit: 2020-01-10 | Discharge: 2020-01-10 | Disposition: A | Discharge status: Home / Self Care | Payer: Medicare PPO | Source: Ambulatory Visit | Attending: Internal Medicine | Admitting: Internal Medicine

## 2020-01-10 DIAGNOSIS — M8580 Other specified disorders of bone density and structure, unspecified site: Secondary | ICD-10-CM | POA: Diagnosis not present

## 2020-01-12 ENCOUNTER — Telehealth: Payer: Self-pay | Admitting: Internal Medicine

## 2020-01-12 NOTE — Telephone Encounter (Signed)
Pt is returning Michaela [name]'s call pt stated that she is only available from 12:00-4:30 today and would like to see if you can give her a call back before that time

## 2020-01-12 NOTE — Telephone Encounter (Signed)
Will try and call pt back around available time for pt.

## 2020-02-22 ENCOUNTER — Ambulatory Visit (INDEPENDENT_AMBULATORY_CARE_PROVIDER_SITE_OTHER): Payer: Medicare PPO | Admitting: Internal Medicine

## 2020-02-22 ENCOUNTER — Other Ambulatory Visit: Payer: Self-pay

## 2020-02-22 ENCOUNTER — Encounter: Payer: Self-pay | Admitting: Internal Medicine

## 2020-02-22 VITALS — BP 120/84 | HR 58 | Temp 98.3°F | Ht 62.5 in | Wt 158.5 lb

## 2020-02-22 DIAGNOSIS — R7303 Prediabetes: Secondary | ICD-10-CM | POA: Diagnosis not present

## 2020-02-22 DIAGNOSIS — R6889 Other general symptoms and signs: Secondary | ICD-10-CM | POA: Diagnosis not present

## 2020-02-22 DIAGNOSIS — E785 Hyperlipidemia, unspecified: Secondary | ICD-10-CM

## 2020-02-22 DIAGNOSIS — Z0000 Encounter for general adult medical examination without abnormal findings: Secondary | ICD-10-CM | POA: Diagnosis not present

## 2020-02-22 DIAGNOSIS — L709 Acne, unspecified: Secondary | ICD-10-CM | POA: Diagnosis not present

## 2020-02-22 DIAGNOSIS — E559 Vitamin D deficiency, unspecified: Secondary | ICD-10-CM | POA: Diagnosis not present

## 2020-02-22 DIAGNOSIS — I10 Essential (primary) hypertension: Secondary | ICD-10-CM

## 2020-02-22 MED ORDER — TRETINOIN 0.025 % EX CREA: TOPICAL_CREAM | CUTANEOUS | 3 refills | Status: AC

## 2020-02-22 NOTE — Progress Notes (Signed)
Established Patient Office Visit     This visit occurred during the SARS-CoV-2 public health emergency.  Safety protocols were in place, including screening questions prior to the visit, additional usage of staff PPE, and extensive cleaning of exam room while observing appropriate contact time as indicated for disinfecting solutions.    CC/Reason for Visit: Annual preventive exam and subsequent Medicare wellness visit  HPI: Michaela [name] is a 69 y.o. female who is coming in today for the above mentioned reasons. Past Medical History is significant for: Hypertension and hyperlipidemia.  At last visit she was having elevated blood pressures.  She brings in her log today with blood pressures in the 1 teens to 130s with some occasional values in the 140s.  Diastolics are within range.  She has routine eye and dental care.  She has no perceived hearing issues, she does yard work every day.  All immunizations are up-to-date, she had a mammogram in April, colonoscopy in 2014, she is overdue for Pap smear.  No acute issues today.   Past Medical/Surgical History: Past Medical History:  Diagnosis Date  . Allergy   . Arthritis    osteoarthritis  . Foot deformity    saw podiatrist remotely  . Heel spur   . Hyperlipidemia   . Hypertension   . Migraine     Past Surgical History:  Procedure Laterality Date  . no prior surgery      Social History:  reports that she quit smoking about 38 years ago. She has never used smokeless tobacco. She reports current alcohol use of about 1.0 standard drink of alcohol per week. She reports that she does not use drugs.  Allergies: Allergies  Allergen Reactions  . Sulfonamide Derivatives Hives    Family History:  Family History  Problem Relation Age of Onset  . Heart disease Mother   . Stroke Mother   . Early death Father        accidental death - trauma  . Colon cancer Paternal Uncle 34  . Stomach cancer Neg Hx      Current Outpatient  Medications:  .  atorvastatin (LIPITOR) 40 MG tablet, Take 1 tablet (40 mg total) by mouth daily at 6 PM., Disp: 90 tablet, Rfl: 0 .  fexofenadine (ALLEGRA) 180 MG tablet, Take 180 mg daily as needed by mouth for allergies or rhinitis., Disp: , Rfl:  .  fluticasone (FLONASE) 50 MCG/ACT nasal spray, SHAKE WELL BEFORE EACH USE AND SPRAY ONE TO TWO SPRAYS IN EACH NOSTRIL ONCE DAILY, Disp: 16 mL, Rfl: 1 .  losartan (COZAAR) 50 MG tablet, Take 1 tablet (50 mg total) by mouth daily., Disp: 90 tablet, Rfl: 0 .  tretinoin (RETIN-A) 0.025 % cream, APPLY ONCE DAILY TO FACE AS DIRECTED, Disp: 45 g, Rfl: 3  Review of Systems:  Constitutional: Denies fever, chills, diaphoresis, appetite change and fatigue.  HEENT: Denies photophobia, eye pain, redness, hearing loss, ear pain, congestion, sore throat, rhinorrhea, sneezing, mouth sores, trouble swallowing, neck pain, neck stiffness and tinnitus.   Respiratory: Denies SOB, DOE, cough, chest tightness,  and wheezing.   Cardiovascular: Denies chest pain, palpitations and leg swelling.  Gastrointestinal: Denies nausea, vomiting, abdominal pain, diarrhea, constipation, blood in stool and abdominal distention.  Genitourinary: Denies dysuria, urgency, frequency, hematuria, flank pain and difficulty urinating.  Endocrine: Denies: hot or cold intolerance, sweats, changes in hair or nails, polyuria, polydipsia. Musculoskeletal: Denies myalgias, back pain, joint swelling, arthralgias and gait problem.  Skin: Denies pallor,  rash and wound.  Neurological: Denies dizziness, seizures, syncope, weakness, light-headedness, numbness and headaches.  Hematological: Denies adenopathy. Easy bruising, personal or family bleeding history  Psychiatric/Behavioral: Denies suicidal ideation, mood changes, confusion, nervousness, sleep disturbance and agitation    Physical Exam: Vitals:   02/22/20 0955  BP: 120/84  Pulse: (!) 58  Temp: 98.3 F (36.8 C)  TempSrc: Temporal  SpO2:  99%  Weight: 158 lb 8 oz (71.9 kg)  Height: 5' 2.5" (1.588 m)    Body mass index is 28.53 kg/m.   Constitutional: NAD, calm, comfortable Eyes: PERRL, lids and conjunctivae normal, wears corrective lenses ENMT: Mucous membranes are moist.Tympanic membrane is pearly white, no erythema or bulging. Neck: normal, supple, no masses, no thyromegaly Respiratory: clear to auscultation bilaterally, no wheezing, no crackles. Normal respiratory effort. No accessory muscle use.  Cardiovascular: Regular rate and rhythm, no murmurs / rubs / gallops. No extremity edema. 2+ pedal pulses.  Abdomen: no tenderness, no masses palpated. No hepatosplenomegaly. Bowel sounds positive.  Musculoskeletal: no clubbing / cyanosis. No joint deformity upper and lower extremities. Good ROM, no contractures. Normal muscle tone.  Skin: no rashes, lesions, ulcers. No induration Neurologic: CN 2-12 grossly intact. Sensation intact, DTR normal. Strength 5/5 in all 4.  Psychiatric: Normal judgment and insight. Alert and oriented x 3. Normal mood.    Subsequent Medicare wellness visit   1. Risk factors, based on past  M,S,F -cardiovascular disease risk factors include age, history of hypertension, history of hyperlipidemia   2.  Physical activities: Yard work and walking every day   3.  Depression/mood:  Stable, not depressed   4.  Hearing:  No perceived issues   5.  ADL's: Independent in all ADLs   6.  Fall risk:  Low fall risk   7.  Home safety: No problems identified   8.  Height weight, and visual acuity: Height and weight as above, visual acuity is 20/25 with the right, 20/32 with the left and 20/20 with eyes together with correction   9.  Counseling:  Advised increase activity to moderate intensity for 30 minutes at least 3 times a week   10. Lab orders based on risk factors: Laboratory update will be reviewed   11. Referral :  None today   12. Care plan:  Follow-up with me in 6 months   13. Cognitive  assessment:  No cognitive impairment   14. Screening: Patient provided with a written and personalized 5-10 year screening schedule in the AVS.   yes   15. Provider List Update:   PCP only  16. Advance Directives: Full code     Office Visit from 11/11/2019 in [facility]  PHQ-9 Total Score 0      Fall Risk  11/11/2019 09/17/2018 09/16/2017 09/16/2017 09/05/2016  Falls in the past year? 0 1 No No No  Number falls in past yr: 0 0 - - -  Injury with Fall? 0 0 - - -  Risk for fall due to : - Impaired balance/gait - - -  Follow up - Education provided;Falls prevention discussed - - -     Impression and Plan:  Encounter for preventive health examination  -She has routine eye and dental care. -Immunizations are up-to-date and age-appropriate. -Screening labs today. -Healthy lifestyle discussed in detail. -She had a colonoscopy in 2014 and is a 10-year callback. -She had a negative mammogram in April 2021. -She is overdue for Pap smear, this will be deferred until follow-up visit  in 6 months.  Acne, unspecified acne type  - Plan: tretinoin (RETIN-A) 0.025 % cream  Essential hypertension -Well-controlled, continue current regimen and continue ambulatory monitoring.  Hyperlipidemia, unspecified hyperlipidemia type  -She has been taking her statin 3 days a week and will slowly increase until she is taking every day. -Her last LDL was 101 in February.    Patient Instructions  -Nice seeing you today!!  -Lab work today; will notify you once results are available.  -Schedule a 6 month follow up. We will also do your pap smear then.   Preventive Care 16 Years and Older, Female Preventive care refers to lifestyle choices and visits with your health care provider that can promote health and wellness. This includes:  A yearly physical exam. This is also called an annual well check.  Regular dental and eye exams.  Immunizations.  Screening for certain  conditions.  Healthy lifestyle choices, such as diet and exercise. What can I expect for my preventive care visit? Physical exam Your health care provider will check:  Height and weight. These may be used to calculate body mass index (BMI), which is a measurement that tells if you are at a healthy weight.  Heart rate and blood pressure.  Your skin for abnormal spots. Counseling Your health care provider may ask you questions about:  Alcohol, tobacco, and drug use.  Emotional well-being.  Home and relationship well-being.  Sexual activity.  Eating habits.  History of falls.  Memory and ability to understand (cognition).  Work and work Statistician.  Pregnancy and menstrual history. What immunizations do I need?  Influenza (flu) vaccine  This is recommended every year. Tetanus, diphtheria, and pertussis (Tdap) vaccine  You may need a Td booster every 10 years. Varicella (chickenpox) vaccine  You may need this vaccine if you have not already been vaccinated. Zoster (shingles) vaccine  You may need this after age 23. Pneumococcal conjugate (PCV13) vaccine  One dose is recommended after age 73. Pneumococcal polysaccharide (PPSV23) vaccine  One dose is recommended after age 1. Measles, mumps, and rubella (MMR) vaccine  You may need at least one dose of MMR if you were born in 1957 or later. You may also need a second dose. Meningococcal conjugate (MenACWY) vaccine  You may need this if you have certain conditions. Hepatitis A vaccine  You may need this if you have certain conditions or if you travel or work in places where you may be exposed to hepatitis A. Hepatitis B vaccine  You may need this if you have certain conditions or if you travel or work in places where you may be exposed to hepatitis B. Haemophilus influenzae type b (Hib) vaccine  You may need this if you have certain conditions. You may receive vaccines as individual doses or as more than  one vaccine together in one shot (combination vaccines). Talk with your health care provider about the risks and benefits of combination vaccines. What tests do I need? Blood tests  Lipid and cholesterol levels. These may be checked every 5 years, or more frequently depending on your overall health.  Hepatitis C test.  Hepatitis B test. Screening  Lung cancer screening. You may have this screening every year starting at age 50 if you have a 30-pack-year history of smoking and currently smoke or have quit within the past 15 years.  Colorectal cancer screening. All adults should have this screening starting at age 63 and continuing until age 60. Your health care provider may recommend screening  at age 4 if you are at increased risk. You will have tests every 1-10 years, depending on your results and the type of screening test.  Diabetes screening. This is done by checking your blood sugar (glucose) after you have not eaten for a while (fasting). You may have this done every 1-3 years.  Mammogram. This may be done every 1-2 years. Talk with your health care provider about how often you should have regular mammograms.  BRCA-related cancer screening. This may be done if you have a family history of breast, ovarian, tubal, or peritoneal cancers. Other tests  Sexually transmitted disease (STD) testing.  Bone density scan. This is done to screen for osteoporosis. You may have this done starting at age 58. Follow these instructions at home: Eating and drinking  Eat a diet that includes fresh fruits and vegetables, whole grains, lean protein, and low-fat dairy products. Limit your intake of foods with high amounts of sugar, saturated fats, and salt.  Take vitamin and mineral supplements as recommended by your health care provider.  Do not drink alcohol if your health care provider tells you not to drink.  If you drink alcohol: ? Limit how much you have to 0-1 drink a day. ? Be aware of how  much alcohol is in your drink. In the [place] one drink equals one 12 oz bottle of beer (355 mL), one 5 oz glass of wine (148 mL), or one 1 oz glass of hard liquor (44 mL). Lifestyle  Take daily care of your teeth and gums.  Stay active. Exercise for at least 30 minutes on 5 or more days each week.  Do not use any products that contain nicotine or tobacco, such as cigarettes, e-cigarettes, and chewing tobacco. If you need help quitting, ask your health care provider.  If you are sexually active, practice safe sex. Use a condom or other form of protection in order to prevent STIs (sexually transmitted infections).  Talk with your health care provider about taking a low-dose aspirin or statin. What's next?  Go to your health care provider once a year for a well check visit.  Ask your health care provider how often you should have your eyes and teeth checked.  Stay up to date on all vaccines. This information is not intended to replace advice given to you by your health care provider. Make sure you discuss any questions you have with your health care provider. Document Revised: 07/16/2018 Document Reviewed: 07/16/2018 Elsevier Patient Education  2020 [place], MD [facility]

## 2020-02-22 NOTE — Patient Instructions (Addendum)
-Nice seeing you today!!  -Lab work today; will notify you once results are available.  -Schedule a 6 month follow up. We will also do your pap smear then.   Preventive Care 69 Years and Older, Female Preventive care refers to lifestyle choices and visits with your health care provider that can promote health and wellness. This includes:  A yearly physical exam. This is also called an annual well check.  Regular dental and eye exams.  Immunizations.  Screening for certain conditions.  Healthy lifestyle choices, such as diet and exercise. What can I expect for my preventive care visit? Physical exam Your health care provider will check:  Height and weight. These may be used to calculate body mass index (BMI), which is a measurement that tells if you are at a healthy weight.  Heart rate and blood pressure.  Your skin for abnormal spots. Counseling Your health care provider may ask you questions about:  Alcohol, tobacco, and drug use.  Emotional well-being.  Home and relationship well-being.  Sexual activity.  Eating habits.  History of falls.  Memory and ability to understand (cognition).  Work and work Astronomer.  Pregnancy and menstrual history. What immunizations do I need?  Influenza (flu) vaccine  This is recommended every year. Tetanus, diphtheria, and pertussis (Tdap) vaccine  You may need a Td booster every 10 years. Varicella (chickenpox) vaccine  You may need this vaccine if you have not already been vaccinated. Zoster (shingles) vaccine  You may need this after age 69. Pneumococcal conjugate (PCV13) vaccine  One dose is recommended after age 69. Pneumococcal polysaccharide (PPSV23) vaccine  One dose is recommended after age 69. Measles, mumps, and rubella (MMR) vaccine  You may need at least one dose of MMR if you were born in 1957 or later. You may also need a second dose. Meningococcal conjugate (MenACWY) vaccine  You may need this  if you have certain conditions. Hepatitis A vaccine  You may need this if you have certain conditions or if you travel or work in places where you may be exposed to hepatitis A. Hepatitis B vaccine  You may need this if you have certain conditions or if you travel or work in places where you may be exposed to hepatitis B. Haemophilus influenzae type b (Hib) vaccine  You may need this if you have certain conditions. You may receive vaccines as individual doses or as more than one vaccine together in one shot (combination vaccines). Talk with your health care provider about the risks and benefits of combination vaccines. What tests do I need? Blood tests  Lipid and cholesterol levels. These may be checked every 5 years, or more frequently depending on your overall health.  Hepatitis C test.  Hepatitis B test. Screening  Lung cancer screening. You may have this screening every year starting at age 69 if you have a 30-pack-year history of smoking and currently smoke or have quit within the past 15 years.  Colorectal cancer screening. All adults should have this screening starting at age 69 and continuing until age 69. Your health care provider may recommend screening at age 69 if you are at increased risk. You will have tests every 1-10 years, depending on your results and the type of screening test.  Diabetes screening. This is done by checking your blood sugar (glucose) after you have not eaten for a while (fasting). You may have this done every 1-3 years.  Mammogram. This may be done every 1-2 years. Talk with your  health care provider about how often you should have regular mammograms.  BRCA-related cancer screening. This may be done if you have a family history of breast, ovarian, tubal, or peritoneal cancers. Other tests  Sexually transmitted disease (STD) testing.  Bone density scan. This is done to screen for osteoporosis. You may have this done starting at age 69. Follow these  instructions at home: Eating and drinking  Eat a diet that includes fresh fruits and vegetables, whole grains, lean protein, and low-fat dairy products. Limit your intake of foods with high amounts of sugar, saturated fats, and salt.  Take vitamin and mineral supplements as recommended by your health care provider.  Do not drink alcohol if your health care provider tells you not to drink.  If you drink alcohol: ? Limit how much you have to 0-1 drink a day. ? Be aware of how much alcohol is in your drink. In the [place] one drink equals one 12 oz bottle of beer (355 mL), one 5 oz glass of wine (148 mL), or one 1 oz glass of hard liquor (44 mL). Lifestyle  Take daily care of your teeth and gums.  Stay active. Exercise for at least 30 minutes on 5 or more days each week.  Do not use any products that contain nicotine or tobacco, such as cigarettes, e-cigarettes, and chewing tobacco. If you need help quitting, ask your health care provider.  If you are sexually active, practice safe sex. Use a condom or other form of protection in order to prevent STIs (sexually transmitted infections).  Talk with your health care provider about taking a low-dose aspirin or statin. What's next?  Go to your health care provider once a year for a well check visit.  Ask your health care provider how often you should have your eyes and teeth checked.  Stay up to date on all vaccines. This information is not intended to replace advice given to you by your health care provider. Make sure you discuss any questions you have with your health care provider. Document Revised: 07/16/2018 Document Reviewed: 07/16/2018 Elsevier Patient Education  2020 [employer].

## 2020-02-23 ENCOUNTER — Other Ambulatory Visit: Payer: Self-pay | Admitting: Internal Medicine

## 2020-02-23 ENCOUNTER — Encounter: Payer: Self-pay | Admitting: Internal Medicine

## 2020-02-23 ENCOUNTER — Other Ambulatory Visit: Payer: Self-pay | Admitting: *Deleted

## 2020-02-23 DIAGNOSIS — E559 Vitamin D deficiency, unspecified: Secondary | ICD-10-CM

## 2020-02-23 LAB — CBC WITH DIFFERENTIAL/PLATELET
Absolute Monocytes: 337 cells/uL (ref 200–950)
Basophils Absolute: 31 cells/uL (ref 0–200)
Basophils Relative: 0.6 %
Eosinophils Absolute: 179 cells/uL (ref 15–500)
Eosinophils Relative: 3.5 %
HCT: 41.2 % (ref 35.0–45.0)
Hemoglobin: 13.6 g/dL (ref 11.7–15.5)
Lymphs Abs: 1193 cells/uL (ref 850–3900)
MCH: 30 pg (ref 27.0–33.0)
MCHC: 33 g/dL (ref 32.0–36.0)
MCV: 90.7 fL (ref 80.0–100.0)
MPV: 10.8 fL (ref 7.5–12.5)
Monocytes Relative: 6.6 %
Neutro Abs: 3361 cells/uL (ref 1500–7800)
Neutrophils Relative %: 65.9 %
Platelets: 199 10*3/uL (ref 140–400)
RBC: 4.54 10*6/uL (ref 3.80–5.10)
RDW: 12.1 % (ref 11.0–15.0)
Total Lymphocyte: 23.4 %
WBC: 5.1 10*3/uL (ref 3.8–10.8)

## 2020-02-23 LAB — COMPREHENSIVE METABOLIC PANEL
AG Ratio: 2.4 (calc) (ref 1.0–2.5)
ALT: 13 U/L (ref 6–29)
AST: 14 U/L (ref 10–35)
Albumin: 4.7 g/dL (ref 3.6–5.1)
Alkaline phosphatase (APISO): 43 U/L (ref 37–153)
BUN: 15 mg/dL (ref 7–25)
CO2: 26 mmol/L (ref 20–32)
Calcium: 9.4 mg/dL (ref 8.6–10.4)
Chloride: 106 mmol/L (ref 98–110)
Creat: 0.75 mg/dL (ref 0.50–0.99)
Globulin: 2 g/dL (calc) (ref 1.9–3.7)
Glucose, Bld: 81 mg/dL (ref 65–99)
Potassium: 4.6 mmol/L (ref 3.5–5.3)
Sodium: 142 mmol/L (ref 135–146)
Total Bilirubin: 0.7 mg/dL (ref 0.2–1.2)
Total Protein: 6.7 g/dL (ref 6.1–8.1)

## 2020-02-23 LAB — HEMOGLOBIN A1C
Hgb A1c MFr Bld: 4.9 % of total Hgb (ref <5.7)
Mean Plasma Glucose: 94 (calc)
eAG (mmol/L): 5.2 (calc)

## 2020-02-23 LAB — VITAMIN B12: Vitamin B-12: 433 pg/mL (ref 200–1100)

## 2020-02-23 LAB — LIPID PANEL
Cholesterol: 203 mg/dL — ABNORMAL HIGH (ref <200)
HDL: 85 mg/dL (ref >=50)
LDL Cholesterol (Calc): 102 mg/dL (calc) — ABNORMAL HIGH
Non-HDL Cholesterol (Calc): 118 mg/dL (calc) (ref <130)
Total CHOL/HDL Ratio: 2.4 (calc) (ref <5.0)
Triglycerides: 73 mg/dL (ref <150)

## 2020-02-23 LAB — VITAMIN D 25 HYDROXY (VIT D DEFICIENCY, FRACTURES): Vit D, 25-Hydroxy: 22 ng/mL — ABNORMAL LOW (ref 30–100)

## 2020-02-23 LAB — TSH: TSH: 2.73 mIU/L (ref 0.40–4.50)

## 2020-02-23 MED ORDER — VITAMIN D (ERGOCALCIFEROL) 1.25 MG (50000 UNIT) PO CAPS: 50000.0000 [IU] | ORAL_CAPSULE | ORAL | 0 refills | Status: AC

## 2020-03-03 ENCOUNTER — Telehealth: Payer: Self-pay | Admitting: *Deleted

## 2020-03-03 ENCOUNTER — Encounter: Payer: Self-pay | Admitting: *Deleted

## 2020-03-03 DIAGNOSIS — L709 Acne, unspecified: Secondary | ICD-10-CM | POA: Insufficient documentation

## 2020-03-03 NOTE — Telephone Encounter (Signed)
Prior auth Started for Tretinoin 0.025 Key: [id number]

## 2020-03-08 NOTE — Telephone Encounter (Signed)
Approved coverage until 08/04/20.

## 2020-04-29 ENCOUNTER — Other Ambulatory Visit: Payer: Self-pay | Admitting: Internal Medicine

## 2020-04-29 DIAGNOSIS — I10 Essential (primary) hypertension: Secondary | ICD-10-CM

## 2020-04-29 DIAGNOSIS — E785 Hyperlipidemia, unspecified: Secondary | ICD-10-CM

## 2020-05-17 ENCOUNTER — Other Ambulatory Visit: Payer: Self-pay

## 2020-05-17 ENCOUNTER — Ambulatory Visit: Payer: Medicare PPO

## 2020-05-17 ENCOUNTER — Other Ambulatory Visit (INDEPENDENT_AMBULATORY_CARE_PROVIDER_SITE_OTHER): Payer: Medicare PPO

## 2020-05-17 DIAGNOSIS — Z23 Encounter for immunization: Secondary | ICD-10-CM | POA: Diagnosis not present

## 2020-05-17 DIAGNOSIS — E559 Vitamin D deficiency, unspecified: Secondary | ICD-10-CM

## 2020-05-17 LAB — VITAMIN D 25 HYDROXY (VIT D DEFICIENCY, FRACTURES): Vit D, 25-Hydroxy: 59 ng/mL (ref 30–100)

## 2020-07-25 ENCOUNTER — Other Ambulatory Visit: Payer: Self-pay | Admitting: Internal Medicine

## 2020-07-25 DIAGNOSIS — I10 Essential (primary) hypertension: Secondary | ICD-10-CM

## 2020-08-01 ENCOUNTER — Other Ambulatory Visit: Payer: Self-pay | Admitting: *Deleted

## 2020-08-01 DIAGNOSIS — I10 Essential (primary) hypertension: Secondary | ICD-10-CM

## 2020-08-01 MED ORDER — LOSARTAN POTASSIUM 50 MG PO TABS: 50.0000 mg | ORAL_TABLET | Freq: Every day | ORAL | 1 refills | Status: DC

## 2020-08-16 ENCOUNTER — Ambulatory Visit: Payer: Medicare PPO | Admitting: Internal Medicine

## 2020-08-16 ENCOUNTER — Other Ambulatory Visit: Payer: Self-pay

## 2020-08-16 ENCOUNTER — Other Ambulatory Visit (HOSPITAL_COMMUNITY)
Admission: RE | Admit: 2020-08-16 | Discharge: 2020-08-16 | Disposition: A | Discharge status: Home / Self Care | Payer: Medicare PPO | Source: Ambulatory Visit | Attending: Internal Medicine | Admitting: Internal Medicine

## 2020-08-16 ENCOUNTER — Encounter: Payer: Self-pay | Admitting: Internal Medicine

## 2020-08-16 VITALS — BP 160/90 | HR 62 | Temp 98.0°F | Wt 157.9 lb

## 2020-08-16 DIAGNOSIS — Z124 Encounter for screening for malignant neoplasm of cervix: Secondary | ICD-10-CM | POA: Diagnosis not present

## 2020-08-16 DIAGNOSIS — I10 Essential (primary) hypertension: Secondary | ICD-10-CM

## 2020-08-16 DIAGNOSIS — E559 Vitamin D deficiency, unspecified: Secondary | ICD-10-CM

## 2020-08-16 DIAGNOSIS — Z1151 Encounter for screening for human papillomavirus (HPV): Secondary | ICD-10-CM | POA: Insufficient documentation

## 2020-08-16 NOTE — Patient Instructions (Signed)
-  Nice seeing you today!!  -See you back in 3 months. Make sure you bring in your blood pressure measurements.  -No need to check vit D levels today. OK to continue 2000 IU daily as you are.

## 2020-08-16 NOTE — Progress Notes (Signed)
Established Patient Office Visit     This visit occurred during the SARS-CoV-2 public health emergency.  Safety protocols were in place, including screening questions prior to the visit, additional usage of staff PPE, and extensive cleaning of exam room while observing appropriate contact time as indicated for disinfecting solutions.    CC/Reason for Visit: Blood pressure follow-up, requesting Pap smear  HPI: Michaela [name] is a 70 y.o. female who is coming in today for the above mentioned reasons. Past Medical History is significant for: Hypertension, hyperlipidemia and vitamin D deficiency.  Blood pressure is noted to be elevated in office today to 160/90.  Previous in office measurements and home measurements have been within normal limits.  She admits to being a little anxious today as this is her first Pap smear in some time.  She has not had cervical cancer screening in years.   Past Medical/Surgical History: Past Medical History:  Diagnosis Date  . Allergy   . Arthritis    osteoarthritis  . Foot deformity    saw podiatrist remotely  . Heel spur   . Hyperlipidemia   . Hypertension   . Migraine     Past Surgical History:  Procedure Laterality Date  . no prior surgery      Social History:  reports that she quit smoking about 38 years ago. She has never used smokeless tobacco. She reports current alcohol use of about 1.0 standard drink of alcohol per week. She reports that she does not use drugs.  Allergies: Allergies  Allergen Reactions  . Sulfonamide Derivatives Hives    Family History:  Family History  Problem Relation Age of Onset  . Heart disease Mother   . Stroke Mother   . Early death Father        accidental death - trauma  . Colon cancer Paternal Uncle 56  . Stomach cancer Neg Hx      Current Outpatient Medications:  .  atorvastatin (LIPITOR) 40 MG tablet, TAKE ONE TABLET BY MOUTH DAILY AT 6PM, Disp: 90 tablet, Rfl: 0 .  fexofenadine (ALLEGRA)  180 MG tablet, Take 180 mg daily as needed by mouth for allergies or rhinitis., Disp: , Rfl:  .  fluticasone (FLONASE) 50 MCG/ACT nasal spray, SHAKE WELL BEFORE EACH USE AND SPRAY ONE TO TWO SPRAYS IN EACH NOSTRIL ONCE DAILY, Disp: 16 mL, Rfl: 1 .  losartan (COZAAR) 50 MG tablet, Take 1 tablet (50 mg total) by mouth daily., Disp: 90 tablet, Rfl: 1 .  tretinoin (RETIN-A) 0.025 % cream, APPLY ONCE DAILY TO FACE AS DIRECTED, Disp: 45 g, Rfl: 3  Review of Systems:  Constitutional: Denies fever, chills, diaphoresis, appetite change and fatigue.  HEENT: Denies photophobia, eye pain, redness, hearing loss, ear pain, congestion, sore throat, rhinorrhea, sneezing, mouth sores, trouble swallowing, neck pain, neck stiffness and tinnitus.   Respiratory: Denies SOB, DOE, cough, chest tightness,  and wheezing.   Cardiovascular: Denies chest pain, palpitations and leg swelling.  Gastrointestinal: Denies nausea, vomiting, abdominal pain, diarrhea, constipation, blood in stool and abdominal distention.  Genitourinary: Denies dysuria, urgency, frequency, hematuria, flank pain and difficulty urinating.  Endocrine: Denies: hot or cold intolerance, sweats, changes in hair or nails, polyuria, polydipsia. Musculoskeletal: Denies myalgias, back pain, joint swelling, arthralgias and gait problem.  Skin: Denies pallor, rash and wound.  Neurological: Denies dizziness, seizures, syncope, weakness, light-headedness, numbness and headaches.  Hematological: Denies adenopathy. Easy bruising, personal or family bleeding history  Psychiatric/Behavioral: Denies suicidal ideation, mood changes,  confusion, nervousness, sleep disturbance and agitation    Physical Exam: Vitals:   08/16/20 0957  BP: (!) 160/90  Pulse: 62  Temp: 98 F (36.7 C)  SpO2: 99%  Weight: 157 lb 14.4 oz (71.6 kg)    Body mass index is 28.42 kg/m.   Constitutional: NAD, calm, comfortable Eyes: PERRL, lids and conjunctivae normal, wears corrective  lenses ENMT: Mucous membranes are moist. Respiratory: clear to auscultation bilaterally, no wheezing, no crackles. Normal respiratory effort. No accessory muscle use.  Cardiovascular: Regular rate and rhythm, no murmurs / rubs / gallops. No extremity edema. Neurologic: Grossly intact and nonfocal Psychiatric: Normal judgment and insight. Alert and oriented x 3. Normal mood.    Impression and Plan:  Essential hypertension -Uncontrolled in office but home measurements are well within range. -Continue ambulatory blood pressure monitoring and return in 3 months for follow-up.  Vitamin D deficiency -Most recent level was 54, continue OTC 2000 IUs daily.  Screening for cervical cancer  - Plan: PAP [[place]]    Patient Instructions  -Nice seeing you today!!  -See you back in 3 months. Make sure you bring in your blood pressure measurements.  -No need to check vit D levels today. OK to continue 2000 IU daily as you are.     Chaya Jan, MD [facility]

## 2020-08-18 ENCOUNTER — Other Ambulatory Visit: Payer: Self-pay | Admitting: Internal Medicine

## 2020-08-18 DIAGNOSIS — E785 Hyperlipidemia, unspecified: Secondary | ICD-10-CM

## 2020-08-18 LAB — CYTOLOGY - PAP
Comment: NEGATIVE
Diagnosis: NEGATIVE
High risk HPV: NEGATIVE

## 2020-11-07 ENCOUNTER — Other Ambulatory Visit: Payer: Self-pay | Admitting: Internal Medicine

## 2020-11-14 ENCOUNTER — Ambulatory Visit: Payer: Medicare PPO | Admitting: Internal Medicine

## 2020-11-14 DIAGNOSIS — H52222 Regular astigmatism, left eye: Secondary | ICD-10-CM | POA: Diagnosis not present

## 2020-11-14 DIAGNOSIS — H524 Presbyopia: Secondary | ICD-10-CM | POA: Diagnosis not present

## 2020-11-14 DIAGNOSIS — H5203 Hypermetropia, bilateral: Secondary | ICD-10-CM | POA: Diagnosis not present

## 2020-11-15 DIAGNOSIS — Z1231 Encounter for screening mammogram for malignant neoplasm of breast: Secondary | ICD-10-CM | POA: Diagnosis not present

## 2020-11-15 LAB — HM MAMMOGRAPHY

## 2020-11-16 ENCOUNTER — Other Ambulatory Visit: Payer: Self-pay

## 2020-11-16 ENCOUNTER — Encounter: Payer: Self-pay | Admitting: Internal Medicine

## 2020-11-16 ENCOUNTER — Ambulatory Visit: Payer: Medicare PPO | Admitting: Internal Medicine

## 2020-11-16 VITALS — BP 130/90 | HR 61 | Temp 98.0°F | Wt 160.0 lb

## 2020-11-16 DIAGNOSIS — I10 Essential (primary) hypertension: Secondary | ICD-10-CM | POA: Diagnosis not present

## 2020-11-16 MED ORDER — LOSARTAN POTASSIUM 100 MG PO TABS: 100.0000 mg | ORAL_TABLET | Freq: Every day | ORAL | 1 refills | Status: DC

## 2020-11-16 NOTE — Progress Notes (Signed)
Established Patient Office Visit     This visit occurred during the SARS-CoV-2 public health emergency.  Safety protocols were in place, including screening questions prior to the visit, additional usage of staff PPE, and extensive cleaning of exam room while observing appropriate contact time as indicated for disinfecting solutions.    CC/Reason for Visit: Blood pressure follow-up  HPI: Michaela [name] is a 70 y.o. female who is coming in today for the above mentioned reasons. Past Medical History is significant for: Hypertension and hyperlipidemia.  She is scheduled today as a blood pressure follow-up after her blood pressure was noted to be 160/90 during her previous visit in January.  She has been doing ambulatory monitoring.  Her numbers are in the high 130s to high 140s with diastolics in the upper 70s to 90 range.  She is feeling well, she has no chest pain, no shortness of breath, no focal neurologic deficits, no headaches.  She is on losartan 50 mg daily for blood pressure.   Past Medical/Surgical History: Past Medical History:  Diagnosis Date  . Allergy   . Arthritis    osteoarthritis  . Foot deformity    saw podiatrist remotely  . Heel spur   . Hyperlipidemia   . Hypertension   . Migraine     Past Surgical History:  Procedure Laterality Date  . no prior surgery      Social History:  reports that she quit smoking about 39 years ago. She has never used smokeless tobacco. She reports current alcohol use of about 1.0 standard drink of alcohol per week. She reports that she does not use drugs.  Allergies: Allergies  Allergen Reactions  . Sulfonamide Derivatives Hives    Family History:  Family History  Problem Relation Age of Onset  . Heart disease Mother   . Stroke Mother   . Early death Father        accidental death - trauma  . Colon cancer Paternal Uncle 59  . Stomach cancer Neg Hx      Current Outpatient Medications:  .  atorvastatin (LIPITOR) 40  MG tablet, TAKE ONE TABLET BY MOUTH DAILY AT 6PM, Disp: 90 tablet, Rfl: 1 .  fexofenadine (ALLEGRA) 180 MG tablet, Take 180 mg daily as needed by mouth for allergies or rhinitis., Disp: , Rfl:  .  fluticasone (FLONASE) 50 MCG/ACT nasal spray, SHAKE WEL BEFORE EACH USE AND INSTILL ONE TO TWO SPARYS IN EACH NOSTRIL ONCE DAILY, Disp: 16 mL, Rfl: 1 .  losartan (COZAAR) 100 MG tablet, Take 1 tablet (100 mg total) by mouth daily., Disp: 90 tablet, Rfl: 1 .  tretinoin (RETIN-A) 0.025 % cream, APPLY ONCE DAILY TO FACE AS DIRECTED, Disp: 45 g, Rfl: 3  Review of Systems:  Constitutional: Denies fever, chills, diaphoresis, appetite change and fatigue.  HEENT: Denies photophobia, eye pain, redness, hearing loss, ear pain, congestion, sore throat, rhinorrhea, sneezing, mouth sores, trouble swallowing, neck pain, neck stiffness and tinnitus.   Respiratory: Denies SOB, DOE, cough, chest tightness,  and wheezing.   Cardiovascular: Denies chest pain, palpitations and leg swelling.  Gastrointestinal: Denies nausea, vomiting, abdominal pain, diarrhea, constipation, blood in stool and abdominal distention.  Genitourinary: Denies dysuria, urgency, frequency, hematuria, flank pain and difficulty urinating.  Endocrine: Denies: hot or cold intolerance, sweats, changes in hair or nails, polyuria, polydipsia. Musculoskeletal: Denies myalgias, back pain, joint swelling, arthralgias and gait problem.  Skin: Denies pallor, rash and wound.  Neurological: Denies dizziness, seizures, syncope, weakness,  light-headedness, numbness and headaches.  Hematological: Denies adenopathy. Easy bruising, personal or family bleeding history  Psychiatric/Behavioral: Denies suicidal ideation, mood changes, confusion, nervousness, sleep disturbance and agitation    Physical Exam: Vitals:   11/16/20 0658  BP: 130/90  Pulse: 61  Temp: 98 F (36.7 C)  TempSrc: Oral  SpO2: 99%  Weight: 160 lb (72.6 kg)    Body mass index is 28.8  kg/m.   Constitutional: NAD, calm, comfortable Eyes: PERRL, lids and conjunctivae normal, wears corrective lenses ENMT: Mucous membranes are moist. Respiratory: clear to auscultation bilaterally, no wheezing, no crackles. Normal respiratory effort. No accessory muscle use.  Cardiovascular: Regular rate and rhythm, no murmurs / rubs / gallops. No extremity edema.  Neurologic: Grossly intact and nonfocal Psychiatric: Normal judgment and insight. Alert and oriented x 3. Normal mood.    Impression and Plan:  Essential hypertension -Blood pressure remains elevated above goal. -I have decided to increase her losartan from 50 to 100 mg daily.  Prescription has been sent. -She will continue to do ambulatory blood pressure monitoring and follow-up with me in 6 to 8 weeks.    Patient Instructions  -Nice seeing you today!!  -Increase losartan from 50 to 100 mg daily.  -Schedule follow up in 6-8 weeks.     Chaya Jan, MD [facility] at  [facility]

## 2020-11-16 NOTE — Patient Instructions (Signed)
-  Nice seeing you today!!  -Increase losartan from 50 to 100 mg daily.  -Schedule follow up in 6-8 weeks.

## 2020-11-29 ENCOUNTER — Encounter: Payer: Self-pay | Admitting: Internal Medicine

## 2021-01-10 ENCOUNTER — Other Ambulatory Visit: Payer: Self-pay

## 2021-01-11 ENCOUNTER — Ambulatory Visit: Payer: Medicare PPO | Admitting: Internal Medicine

## 2021-01-11 ENCOUNTER — Encounter: Payer: Self-pay | Admitting: Internal Medicine

## 2021-01-11 VITALS — BP 130/85 | HR 50 | Temp 98.3°F | Wt 159.6 lb

## 2021-01-11 DIAGNOSIS — I10 Essential (primary) hypertension: Secondary | ICD-10-CM | POA: Diagnosis not present

## 2021-01-11 DIAGNOSIS — M79671 Pain in right foot: Secondary | ICD-10-CM | POA: Diagnosis not present

## 2021-01-11 NOTE — Progress Notes (Signed)
Established Patient Office Visit     This visit occurred during the SARS-CoV-2 public health emergency.  Safety protocols were in place, including screening questions prior to the visit, additional usage of staff PPE, and extensive cleaning of exam room while observing appropriate contact time as indicated for disinfecting solutions.    CC/Reason for Visit: 6-week follow-up blood pressure  HPI: Michaela [name] is a 70 y.o. female who is coming in today for the above mentioned reasons. Past Medical History is significant for: Hypertension and hyperlipidemia.  I last saw her on April 14.  At that time her losartan was increased from 50 to 100 mg due to uncontrolled blood pressure and she is here today for follow-up.  She is requesting referral to podiatry for a corn at the base of her third metatarsal phalangeal.  She brings in her home blood pressure log:  137/69 126/68 136/68 133/72 132/69 146/76 135/66 130/72   Past Medical/Surgical History: Past Medical History:  Diagnosis Date   Allergy    Arthritis    osteoarthritis   Foot deformity    saw podiatrist remotely   Heel spur    Hyperlipidemia    Hypertension    Migraine     Past Surgical History:  Procedure Laterality Date   no prior surgery      Social History:  reports that she quit smoking about 39 years ago. Her smoking use included cigarettes. She has never used smokeless tobacco. She reports current alcohol use of about 1.0 standard drink of alcohol per week. She reports that she does not use drugs.  Allergies: Allergies  Allergen Reactions   Sulfonamide Derivatives Hives    Family History:  Family History  Problem Relation Age of Onset   Heart disease Mother    Stroke Mother    Early death Father        accidental death - trauma   Colon cancer Paternal Uncle 80   Stomach cancer Neg Hx      Current Outpatient Medications:    atorvastatin (LIPITOR) 40 MG tablet, TAKE ONE TABLET BY MOUTH DAILY  AT 6PM, Disp: 90 tablet, Rfl: 1   fexofenadine (ALLEGRA) 180 MG tablet, Take 180 mg daily as needed by mouth for allergies or rhinitis., Disp: , Rfl:    fluticasone (FLONASE) 50 MCG/ACT nasal spray, SHAKE WEL BEFORE EACH USE AND INSTILL ONE TO TWO SPARYS IN EACH NOSTRIL ONCE DAILY, Disp: 16 mL, Rfl: 1   losartan (COZAAR) 100 MG tablet, Take 1 tablet (100 mg total) by mouth daily., Disp: 90 tablet, Rfl: 1   tretinoin (RETIN-A) 0.025 % cream, APPLY ONCE DAILY TO FACE AS DIRECTED, Disp: 45 g, Rfl: 3  Review of Systems:  Constitutional: Denies fever, chills, diaphoresis, appetite change and fatigue.  HEENT: Denies photophobia, eye pain, redness, hearing loss, ear pain, congestion, sore throat, rhinorrhea, sneezing, mouth sores, trouble swallowing, neck pain, neck stiffness and tinnitus.   Respiratory: Denies SOB, DOE, cough, chest tightness,  and wheezing.   Cardiovascular: Denies chest pain, palpitations and leg swelling.  Gastrointestinal: Denies nausea, vomiting, abdominal pain, diarrhea, constipation, blood in stool and abdominal distention.  Genitourinary: Denies dysuria, urgency, frequency, hematuria, flank pain and difficulty urinating.  Endocrine: Denies: hot or cold intolerance, sweats, changes in hair or nails, polyuria, polydipsia. Musculoskeletal: Denies myalgias, back pain, joint swelling, arthralgias and gait problem.  Skin: Denies pallor, rash and wound.  Neurological: Denies dizziness, seizures, syncope, weakness, light-headedness, numbness and headaches.  Hematological: Denies adenopathy.  Easy bruising, personal or family bleeding history  Psychiatric/Behavioral: Denies suicidal ideation, mood changes, confusion, nervousness, sleep disturbance and agitation    Physical Exam: Vitals:   01/11/21 0954  BP: 130/85  Pulse: (!) 50  Temp: 98.3 F (36.8 C)  TempSrc: Oral  SpO2: 99%  Weight: 159 lb 9.6 oz (72.4 kg)    Body mass index is 28.73 kg/m.   Constitutional: NAD,  calm, comfortable Eyes: PERRL, lids and conjunctivae normal, wears corrective lenses ENMT: Mucous membranes are moist.  Respiratory: clear to auscultation bilaterally, no wheezing, no crackles. Normal respiratory effort. No accessory muscle use.  Cardiovascular: Regular rate and rhythm, no murmurs / rubs / gallops. No extremity edema. 2 Neurologic: Grossly intact and nonfocal Psychiatric: Normal judgment and insight. Alert and oriented x 3. Normal mood.    Impression and Plan:  Right foot pain/corn  Plan: Ambulatory referral to Podiatry  Essential hypertension -Blood pressures fairly well controlled. -Continue to monitor and return in 3 months for follow-up.      Chaya Jan, MD [facility]

## 2021-01-25 ENCOUNTER — Ambulatory Visit (INDEPENDENT_AMBULATORY_CARE_PROVIDER_SITE_OTHER): Payer: Medicare PPO

## 2021-01-25 ENCOUNTER — Ambulatory Visit: Payer: Medicare PPO | Admitting: Podiatry

## 2021-01-25 ENCOUNTER — Other Ambulatory Visit: Payer: Self-pay

## 2021-01-25 VITALS — BP 132/78 | HR 84 | Temp 97.8°F

## 2021-01-25 DIAGNOSIS — M2041 Other hammer toe(s) (acquired), right foot: Secondary | ICD-10-CM | POA: Diagnosis not present

## 2021-01-25 DIAGNOSIS — L84 Corns and callosities: Secondary | ICD-10-CM

## 2021-01-25 DIAGNOSIS — M2042 Other hammer toe(s) (acquired), left foot: Secondary | ICD-10-CM

## 2021-01-25 DIAGNOSIS — M21611 Bunion of right foot: Secondary | ICD-10-CM

## 2021-02-01 NOTE — Progress Notes (Signed)
Subjective:   Patient ID: Michaela [name], female   DOB: 70 y.o.   MRN: [id number]   HPI 70 year old female presents the office today for concerns of a painful skin lesion on the bottom of her right foot which is been ongoing for more than 20 years.  She states the area is very uncomfortable and hurts to walk and put pressure at times.  She tries to shave it down.  No swelling or redness or any drainage that she reports.  No other concerns.   Review of Systems  All other systems reviewed and are negative.   Past Medical History:  Diagnosis Date   Allergy    Arthritis    osteoarthritis   Foot deformity    saw podiatrist remotely   Heel spur    Hyperlipidemia    Hypertension    Migraine     Past Surgical History:  Procedure Laterality Date   no prior surgery       Current Outpatient Medications:    atorvastatin (LIPITOR) 40 MG tablet, TAKE ONE TABLET BY MOUTH DAILY AT 6PM, Disp: 90 tablet, Rfl: 1   fexofenadine (ALLEGRA) 180 MG tablet, Take 180 mg daily as needed by mouth for allergies or rhinitis., Disp: , Rfl:    fluticasone (FLONASE) 50 MCG/ACT nasal spray, SHAKE WEL BEFORE EACH USE AND INSTILL ONE TO TWO SPARYS IN EACH NOSTRIL ONCE DAILY, Disp: 16 mL, Rfl: 1   losartan (COZAAR) 100 MG tablet, Take 1 tablet (100 mg total) by mouth daily., Disp: 90 tablet, Rfl: 1   tretinoin (RETIN-A) 0.025 % cream, APPLY ONCE DAILY TO FACE AS DIRECTED, Disp: 45 g, Rfl: 3  Allergies  Allergen Reactions   Sulfonamide Derivatives Hives          Objective:  Physical Exam  General: AAO x3, NAD  Dermatological: Hyperkeratotic lesion right foot submetatarsal 2.  Upon debridement there is no underlying ulceration drainage or any signs of infection.  Vascular: Dorsalis Pedis artery and Posterior Tibial artery pedal pulses are 2/4 bilateral with immedate capillary fill time. There is no pain with calf compression, swelling, warmth, erythema.   Neruologic: Grossly intact via light touch  bilateral.   Musculoskeletal: Severe hammertoes as well as bunion deformities present with overlapping second digit.  Prominent metatarsal head plantarly.  Muscular strength 5/5 in all groups tested bilateral.  Gait: Unassisted, Nonantalgic.       Assessment:   Hyperkeratotic lesion due to digital deformity     Plan:  -Treatment options discussed including all alternatives, risks, and complications -Etiology of symptoms were discussed -X-rays were obtained and reviewed with the patient.  Severe bunion, hammertoes are present with overlapping second digit.  No evidence of acute fracture. -Sharply debrided hyperkeratotic lesion x1 without any complications or bleeding.  Recommend moisturizer and offloading daily.  Discussed shoe modifications and consider inserts for her shoes to help offload as well.  Surgical intervention as well if needed to correct bunion, hammertoes.  Vivi Barrack DPM

## 2021-02-14 ENCOUNTER — Telehealth: Payer: Self-pay | Admitting: Podiatry

## 2021-02-14 NOTE — Telephone Encounter (Signed)
Orthotics in.. lvm for pt to call to schedule an appt to pick them up. °

## 2021-03-05 ENCOUNTER — Other Ambulatory Visit: Payer: Medicare PPO

## 2021-03-07 ENCOUNTER — Ambulatory Visit (INDEPENDENT_AMBULATORY_CARE_PROVIDER_SITE_OTHER): Payer: Medicare PPO | Admitting: Podiatry

## 2021-03-07 ENCOUNTER — Other Ambulatory Visit: Payer: Self-pay

## 2021-03-07 DIAGNOSIS — M2041 Other hammer toe(s) (acquired), right foot: Secondary | ICD-10-CM

## 2021-03-07 DIAGNOSIS — M21611 Bunion of right foot: Secondary | ICD-10-CM

## 2021-03-07 DIAGNOSIS — M2042 Other hammer toe(s) (acquired), left foot: Secondary | ICD-10-CM

## 2021-03-07 NOTE — Patient Instructions (Signed)

## 2021-03-07 NOTE — Progress Notes (Signed)
Patient presents today for orthotic pick up. Patient voices no new complaints.  Orthotics were fitted to patient's feet. No discomfort and no rubbing. Patient satisfied with the orthotics.  Orthotics were dispensed to patient with instructions for break in wear and to call the office with any concerns or questions. 

## 2021-04-23 ENCOUNTER — Telehealth: Payer: Self-pay | Admitting: *Deleted

## 2021-04-23 NOTE — Telephone Encounter (Signed)
Pt  has a CPE coming up and will discuss scheduling her Medicare wellness visit.

## 2021-05-24 ENCOUNTER — Ambulatory Visit (INDEPENDENT_AMBULATORY_CARE_PROVIDER_SITE_OTHER): Payer: Medicare PPO | Admitting: Internal Medicine

## 2021-05-24 ENCOUNTER — Encounter: Payer: Self-pay | Admitting: Internal Medicine

## 2021-05-24 ENCOUNTER — Other Ambulatory Visit: Payer: Self-pay

## 2021-05-24 VITALS — BP 130/90 | Temp 98.3°F | Ht 61.5 in | Wt 161.0 lb

## 2021-05-24 DIAGNOSIS — E559 Vitamin D deficiency, unspecified: Secondary | ICD-10-CM | POA: Diagnosis not present

## 2021-05-24 DIAGNOSIS — E785 Hyperlipidemia, unspecified: Secondary | ICD-10-CM

## 2021-05-24 DIAGNOSIS — Z0000 Encounter for general adult medical examination without abnormal findings: Secondary | ICD-10-CM | POA: Diagnosis not present

## 2021-05-24 DIAGNOSIS — Z23 Encounter for immunization: Secondary | ICD-10-CM

## 2021-05-24 DIAGNOSIS — I10 Essential (primary) hypertension: Secondary | ICD-10-CM

## 2021-05-24 LAB — CBC WITH DIFFERENTIAL/PLATELET
Basophils Absolute: 0 10*3/uL (ref 0.0–0.1)
Basophils Relative: 0.6 % (ref 0.0–3.0)
Eosinophils Absolute: 0.1 10*3/uL (ref 0.0–0.7)
Eosinophils Relative: 2.3 % (ref 0.0–5.0)
HCT: 40.3 % (ref 36.0–46.0)
Hemoglobin: 13.4 g/dL (ref 12.0–15.0)
Lymphocytes Relative: 26.6 % (ref 12.0–46.0)
Lymphs Abs: 1.2 10*3/uL (ref 0.7–4.0)
MCHC: 33.4 g/dL (ref 30.0–36.0)
MCV: 89.6 fl (ref 78.0–100.0)
Monocytes Absolute: 0.4 10*3/uL (ref 0.1–1.0)
Monocytes Relative: 8.2 % (ref 3.0–12.0)
Neutro Abs: 2.7 10*3/uL (ref 1.4–7.7)
Neutrophils Relative %: 62.3 % (ref 43.0–77.0)
Platelets: 194 10*3/uL (ref 150.0–400.0)
RBC: 4.5 Mil/uL (ref 3.87–5.11)
RDW: 13.3 % (ref 11.5–15.5)
WBC: 4.4 10*3/uL (ref 4.0–10.5)

## 2021-05-24 LAB — COMPREHENSIVE METABOLIC PANEL
ALT: 17 U/L (ref 0–35)
AST: 16 U/L (ref 0–37)
Albumin: 4.7 g/dL (ref 3.5–5.2)
Alkaline Phosphatase: 39 U/L (ref 39–117)
BUN: 18 mg/dL (ref 6–23)
CO2: 28 mEq/L (ref 19–32)
Calcium: 9.3 mg/dL (ref 8.4–10.5)
Chloride: 105 mEq/L (ref 96–112)
Creatinine, Ser: 0.76 mg/dL (ref 0.40–1.20)
GFR: 79.66 mL/min (ref >60.00)
Glucose, Bld: 86 mg/dL (ref 70–99)
Potassium: 3.9 mEq/L (ref 3.5–5.1)
Sodium: 140 mEq/L (ref 135–145)
Total Bilirubin: 0.8 mg/dL (ref 0.2–1.2)
Total Protein: 6.9 g/dL (ref 6.0–8.3)

## 2021-05-24 LAB — LIPID PANEL
Cholesterol: 183 mg/dL (ref 0–200)
HDL: 82 mg/dL (ref >39.00)
LDL Cholesterol: 82 mg/dL (ref 0–99)
NonHDL: 100.82
Total CHOL/HDL Ratio: 2
Triglycerides: 92 mg/dL (ref 0.0–149.0)
VLDL: 18.4 mg/dL (ref 0.0–40.0)

## 2021-05-24 LAB — VITAMIN D 25 HYDROXY (VIT D DEFICIENCY, FRACTURES): VITD: 24.25 ng/mL — ABNORMAL LOW (ref 30.00–100.00)

## 2021-05-24 LAB — TSH: TSH: 2.36 u[IU]/mL (ref 0.35–5.50)

## 2021-05-24 LAB — VITAMIN B12: Vitamin B-12: 417 pg/mL (ref 211–911)

## 2021-05-24 LAB — HEMOGLOBIN A1C: Hgb A1c MFr Bld: 5.2 % (ref 4.6–6.5)

## 2021-05-24 MED ORDER — AMLODIPINE BESYLATE 5 MG PO TABS: 5.0000 mg | ORAL_TABLET | Freq: Every day | ORAL | 1 refills | Status: DC

## 2021-05-24 MED ORDER — ATORVASTATIN CALCIUM 40 MG PO TABS: ORAL_TABLET | ORAL | 1 refills | Status: DC

## 2021-05-24 NOTE — Patient Instructions (Signed)
-  Nice seeing you today!!  -Lab work today; will notify you once results are available.  -Start amlodipine 5 mg daily.  -Schedule follow up in 8 weeks for your blood pressure.

## 2021-05-24 NOTE — Addendum Note (Signed)
Addended by: Kandra Nicolas on: 05/24/2021 09:45 AM   Modules accepted: Orders

## 2021-05-24 NOTE — Addendum Note (Signed)
Addended by: Kern Reap B on: 05/24/2021 05:03 PM   Modules accepted: Orders

## 2021-05-24 NOTE — Progress Notes (Signed)
Established Patient Office Visit     This visit occurred during the SARS-CoV-2 public health emergency.  Safety protocols were in place, including screening questions prior to the visit, additional usage of staff PPE, and extensive cleaning of exam room while observing appropriate contact time as indicated for disinfecting solutions.    CC/Reason for Visit: Annual preventive exam, subsequent Medicare wellness visit, follow-up chronic medical conditions  HPI: Michaela [name] is a 70 y.o. female who is coming in today for the above mentioned reasons. Past Medical History is significant for: Hypertension, hyperlipidemia, vitamin D deficiency.  She has routine eye and dental care, only mild hearing issues that she does not believe affect her day-to-day.  She does not exercise routinely.  All immunizations are up-to-date.  She had a colonoscopy in 2014, Pap smear in January 2022, mammogram in April 2022, bone density test in 2021.  She has been keeping a blood pressure log:  135/66 130/72 132/94 130/66 142/70 150/80 130/90.  She feels well and has no acute concerns today.   Past Medical/Surgical History: Past Medical History:  Diagnosis Date   Allergy    Arthritis    osteoarthritis   Foot deformity    saw podiatrist remotely   Heel spur    Hyperlipidemia    Hypertension    Migraine     Past Surgical History:  Procedure Laterality Date   no prior surgery      Social History:  reports that she quit smoking about 39 years ago. Her smoking use included cigarettes. She has never used smokeless tobacco. She reports current alcohol use of about 1.0 standard drink per week. She reports that she does not use drugs.  Allergies: Allergies  Allergen Reactions   Sulfonamide Derivatives Hives    Family History:  Family History  Problem Relation Age of Onset   Heart disease Mother    Stroke Mother    Early death Father        accidental death - trauma   Colon cancer  Paternal Uncle 26   Stomach cancer Neg Hx      Current Outpatient Medications:    amLODipine (NORVASC) 5 MG tablet, Take 1 tablet (5 mg total) by mouth daily., Disp: 90 tablet, Rfl: 1   fexofenadine (ALLEGRA) 180 MG tablet, Take 180 mg daily as needed by mouth for allergies or rhinitis., Disp: , Rfl:    fluticasone (FLONASE) 50 MCG/ACT nasal spray, SHAKE WEL BEFORE EACH USE AND INSTILL ONE TO TWO SPARYS IN EACH NOSTRIL ONCE DAILY, Disp: 16 mL, Rfl: 1   losartan (COZAAR) 100 MG tablet, Take 1 tablet (100 mg total) by mouth daily., Disp: 90 tablet, Rfl: 1   tretinoin (RETIN-A) 0.025 % cream, APPLY ONCE DAILY TO FACE AS DIRECTED, Disp: 45 g, Rfl: 3   atorvastatin (LIPITOR) 40 MG tablet, TAKE ONE TABLET BY MOUTH DAILY AT 6PM, Disp: 90 tablet, Rfl: 1  Review of Systems:  Constitutional: Denies fever, chills, diaphoresis, appetite change and fatigue.  HEENT: Denies photophobia, eye pain, redness, hearing loss, ear pain, congestion, sore throat, rhinorrhea, sneezing, mouth sores, trouble swallowing, neck pain, neck stiffness and tinnitus.   Respiratory: Denies SOB, DOE, cough, chest tightness,  and wheezing.   Cardiovascular: Denies chest pain, palpitations and leg swelling.  Gastrointestinal: Denies nausea, vomiting, abdominal pain, diarrhea, constipation, blood in stool and abdominal distention.  Genitourinary: Denies dysuria, urgency, frequency, hematuria, flank pain and difficulty urinating.  Endocrine: Denies: hot or cold intolerance, sweats, changes in  hair or nails, polyuria, polydipsia. Musculoskeletal: Denies myalgias, back pain, joint swelling, arthralgias and gait problem.  Skin: Denies pallor, rash and wound.  Neurological: Denies dizziness, seizures, syncope, weakness, light-headedness, numbness and headaches.  Hematological: Denies adenopathy. Easy bruising, personal or family bleeding history  Psychiatric/Behavioral: Denies suicidal ideation, mood changes, confusion, nervousness,  sleep disturbance and agitation    Physical Exam: Vitals:   05/24/21 0903  BP: 130/90  Temp: 98.3 F (36.8 C)  TempSrc: Oral  Weight: 161 lb (73 kg)  Height: 5' 1.5" (1.562 m)    Body mass index is 29.93 kg/m.   Constitutional: NAD, calm, comfortable Eyes: PERRL, lids and conjunctivae normal, wears corrective lenses ENMT: Mucous membranes are moist. Posterior pharynx clear of any exudate or lesions. Normal dentition. Tympanic membrane is pearly white, no erythema or bulging. Neck: normal, supple, no masses, no thyromegaly Respiratory: clear to auscultation bilaterally, no wheezing, no crackles. Normal respiratory effort. No accessory muscle use.  Cardiovascular: Regular rate and rhythm, no murmurs / rubs / gallops. No extremity edema. 2+ pedal pulses. No carotid bruits.  Abdomen: no tenderness, no masses palpated. No hepatosplenomegaly. Bowel sounds positive.  Musculoskeletal: no clubbing / cyanosis. No joint deformity upper and lower extremities. Good ROM, no contractures. Normal muscle tone.  Skin: no rashes, lesions, ulcers. No induration Neurologic: CN 2-12 grossly intact. Sensation intact, DTR normal. Strength 5/5 in all 4.  Psychiatric: Normal judgment and insight. Alert and oriented x 3. Normal mood.    Subsequent Medicare wellness visit   1. Risk factors, based on past  M,S,F -cardiovascular disease risk factors include age and history of hypertension and hyperlipidemia   2.  Physical activities: Sedentary other than housework   3.  Depression/mood: Stable, not depressed   4.  Hearing: Mild hearing issues   5.  ADL's: Independent in all ADLs   6.  Fall risk: Low fall risk   7.  Home safety: No problems identified   8.  Height weight, and visual acuity: height and weight as above, vision:  Vision Screening   Right eye Left eye Both eyes  Without correction     With correction 20/20 20/32 20/20      9.  Counseling: Advised ambulatory blood pressure  monitoring   10. Lab orders based on risk factors: Laboratory update will be reviewed   11. Referral : None today   12. Care plan: Follow-up with me in 8 weeks   13. Cognitive assessment: No cognitive impairment   14. Screening: Patient provided with a written and personalized 5-10 year screening schedule in the AVS. yes   15. Provider List Update: PCP only  16. Advance Directives: Full code   17. Opioids: Patient is not on any opioid prescriptions and has no risk factors for a substance use disorder.   Flowsheet Row Office Visit from 05/24/2021 in [facility] [place]  PHQ-9 Total Score 0       Fall Risk  05/24/2021 01/11/2021 11/16/2020 11/11/2019 09/17/2018  Falls in the past year? 0 0 - 0 1  Number falls in past yr: 0 0 0 0 0  Injury with Fall? 0 0 0 0 0  Risk for fall due to : - - - - Impaired balance/gait  Follow up - - - - Education provided;Falls prevention discussed     Impression and Plan:  Encounter for preventive health examination -Advised routine eye and dental care. -All immunizations are up-to-date and age-appropriate, flu vaccine administered today. -Labs will be updated. -Healthy  lifestyle discussed in detail. -She had a colonoscopy in 2014 and is a 10-year callback -Negative mammogram in April 2022. -Negative Pap in January 2022. -Bone density in 2021.  Hyperlipidemia, unspecified hyperlipidemia type  - Plan: atorvastatin (LIPITOR) 40 MG tablet -Check lipids -Continue atorvastatin daily.  Essential hypertension  - Plan: CBC with Differential/Platelet, Comprehensive metabolic panel, Hemoglobin A1c, Lipid panel, TSH, Vitamin B12, amLODipine (NORVASC) 5 MG tablet -Remains not well controlled on losartan 100 mg daily. -We will add amlodipine 5 mg daily, she will do ambulatory blood pressure monitoring and return in 8 weeks for follow-up.  Vitamin D deficiency  - Plan: VITAMIN D 25 Hydroxy (Vit-D Deficiency, Fractures)  Need for  influenza vaccination -Flu vaccine administered today.    Patient Instructions  -Nice seeing you today!!  -Lab work today; will notify you once results are available.  -Start amlodipine 5 mg daily.  -Schedule follow up in 8 weeks for your blood pressure.      Chaya Jan, MD [facility] at [facility]

## 2021-05-25 ENCOUNTER — Other Ambulatory Visit: Payer: Self-pay | Admitting: Internal Medicine

## 2021-05-25 DIAGNOSIS — E559 Vitamin D deficiency, unspecified: Secondary | ICD-10-CM

## 2021-05-25 MED ORDER — VITAMIN D (ERGOCALCIFEROL) 1.25 MG (50000 UNIT) PO CAPS: 50000.0000 [IU] | ORAL_CAPSULE | ORAL | 0 refills | Status: AC

## 2021-05-31 ENCOUNTER — Other Ambulatory Visit: Payer: Self-pay | Admitting: Internal Medicine

## 2021-05-31 DIAGNOSIS — E559 Vitamin D deficiency, unspecified: Secondary | ICD-10-CM

## 2021-06-13 ENCOUNTER — Encounter: Payer: Self-pay | Admitting: Internal Medicine

## 2021-06-16 ENCOUNTER — Other Ambulatory Visit: Payer: Self-pay | Admitting: Internal Medicine

## 2021-06-16 DIAGNOSIS — I10 Essential (primary) hypertension: Secondary | ICD-10-CM

## 2021-07-06 ENCOUNTER — Other Ambulatory Visit: Payer: Self-pay | Admitting: Internal Medicine

## 2021-07-19 ENCOUNTER — Ambulatory Visit: Payer: Medicare PPO | Admitting: Internal Medicine

## 2021-07-19 ENCOUNTER — Encounter: Payer: Self-pay | Admitting: Internal Medicine

## 2021-07-19 VITALS — BP 132/86 | HR 64 | Temp 98.1°F | Ht 61.5 in | Wt 159.0 lb

## 2021-07-19 DIAGNOSIS — I10 Essential (primary) hypertension: Secondary | ICD-10-CM | POA: Diagnosis not present

## 2021-07-19 NOTE — Progress Notes (Signed)
Established Patient Office Visit     This visit occurred during the SARS-CoV-2 public health emergency.  Safety protocols were in place, including screening questions prior to the visit, additional usage of staff PPE, and extensive cleaning of exam room while observing appropriate contact time as indicated for disinfecting solutions.    CC/Reason for Visit: Follow-up blood pressure  HPI: Michaela [name] is a 70 y.o. female who is coming in today for the above mentioned reasons.  At last visit Norvasc 5 mg was added to losartan 100 mg due to uncontrolled blood pressure.  In office blood pressure today is 132/86 which is consistent with her ambulatory measurements as well.  She is feeling well and has no acute concerns.  She is hesitant to add additional medications.  Past Medical/Surgical History: Past Medical History:  Diagnosis Date   Allergy    Arthritis    osteoarthritis   Foot deformity    saw podiatrist remotely   Heel spur    Hyperlipidemia    Hypertension    Migraine     Past Surgical History:  Procedure Laterality Date   no prior surgery      Social History:  reports that she quit smoking about 39 years ago. Her smoking use included cigarettes. She has never used smokeless tobacco. She reports current alcohol use of about 1.0 standard drink per week. She reports that she does not use drugs.  Allergies: Allergies  Allergen Reactions   Sulfonamide Derivatives Hives    Family History:  Family History  Problem Relation Age of Onset   Heart disease Mother    Stroke Mother    Early death Father        accidental death - trauma   Colon cancer Paternal Uncle 64   Stomach cancer Neg Hx      Current Outpatient Medications:    amLODipine (NORVASC) 5 MG tablet, Take 1 tablet (5 mg total) by mouth daily., Disp: 90 tablet, Rfl: 1   atorvastatin (LIPITOR) 40 MG tablet, TAKE ONE TABLET BY MOUTH DAILY AT 6PM, Disp: 90 tablet, Rfl: 1   fexofenadine (ALLEGRA) 180 MG  tablet, Take 180 mg daily as needed by mouth for allergies or rhinitis., Disp: , Rfl:    fluticasone (FLONASE) 50 MCG/ACT nasal spray, USE 1 TO 2 SPRAYS INTO EACH NOSTRIL ONCE DAILY.  SHAKE WELL BEFORE USE, Disp: 16 mL, Rfl: 1   losartan (COZAAR) 100 MG tablet, TAKE ONE TABLET BY MOUTH DAILY, Disp: 90 tablet, Rfl: 1   tretinoin (RETIN-A) 0.025 % cream, APPLY ONCE DAILY TO FACE AS DIRECTED, Disp: 45 g, Rfl: 3   Vitamin D, Ergocalciferol, (DRISDOL) 1.25 MG (50000 UNIT) CAPS capsule, Take 1 capsule (50,000 Units total) by mouth every 7 (seven) days for 12 doses., Disp: 12 capsule, Rfl: 0  Review of Systems:  Constitutional: Denies fever, chills, diaphoresis, appetite change and fatigue.  HEENT: Denies photophobia, eye pain, redness, hearing loss, ear pain, congestion, sore throat, rhinorrhea, sneezing, mouth sores, trouble swallowing, neck pain, neck stiffness and tinnitus.   Respiratory: Denies SOB, DOE, cough, chest tightness,  and wheezing.   Cardiovascular: Denies chest pain, palpitations and leg swelling.  Gastrointestinal: Denies nausea, vomiting, abdominal pain, diarrhea, constipation, blood in stool and abdominal distention.  Genitourinary: Denies dysuria, urgency, frequency, hematuria, flank pain and difficulty urinating.  Endocrine: Denies: hot or cold intolerance, sweats, changes in hair or nails, polyuria, polydipsia. Musculoskeletal: Denies myalgias, back pain, joint swelling, arthralgias and gait problem.  Skin:  Denies pallor, rash and wound.  Neurological: Denies dizziness, seizures, syncope, weakness, light-headedness, numbness and headaches.  Hematological: Denies adenopathy. Easy bruising, personal or family bleeding history  Psychiatric/Behavioral: Denies suicidal ideation, mood changes, confusion, nervousness, sleep disturbance and agitation    Physical Exam: Vitals:   07/19/21 0928  BP: 132/86  Pulse: 64  Temp: 98.1 F (36.7 C)  TempSrc: Oral  SpO2: 99%  Weight: 159  lb (72.1 kg)  Height: 5' 1.5" (1.562 m)    Body mass index is 29.56 kg/m.   Constitutional: NAD, calm, comfortable Eyes: PERRL, lids and conjunctivae normal, wears corrective lenses ENMT: Mucous membranes are moist.  Respiratory: clear to auscultation bilaterally, no wheezing, no crackles. Normal respiratory effort. No accessory muscle use.  Cardiovascular: Regular rate and rhythm, no murmurs / rubs / gallops. No extremity edema.  Neurologic: Grossly intact and nonfocal Psychiatric: Normal judgment and insight. Alert and oriented x 3. Normal mood.    Impression and Plan:  Essential hypertension -Not well controlled. -If she is hesitant to add new medications and requests an additional 6 weeks to work on lifestyle changes and reducing sodium in diet. -She will continue ambulatory measurements and return in 6 weeks for follow-up.  Time spent: 21 minutes reviewing chart, interviewing and examining patient and formulating plan of care.    Lelon Frohlich, MD [facility] at [facility]

## 2021-08-08 ENCOUNTER — Encounter: Payer: Self-pay | Admitting: Internal Medicine

## 2021-09-06 ENCOUNTER — Ambulatory Visit: Payer: Medicare PPO | Admitting: Internal Medicine

## 2021-09-06 ENCOUNTER — Encounter: Payer: Self-pay | Admitting: Internal Medicine

## 2021-09-06 VITALS — BP 140/90 | HR 67 | Temp 98.4°F | Wt 153.7 lb

## 2021-09-06 DIAGNOSIS — I10 Essential (primary) hypertension: Secondary | ICD-10-CM

## 2021-09-06 DIAGNOSIS — J3089 Other allergic rhinitis: Secondary | ICD-10-CM | POA: Diagnosis not present

## 2021-09-06 MED ORDER — LOSARTAN POTASSIUM 100 MG PO TABS: 100.0000 mg | ORAL_TABLET | Freq: Every day | ORAL | 1 refills | Status: DC

## 2021-09-06 MED ORDER — FLUTICASONE PROPIONATE 50 MCG/ACT NA SUSP: NASAL | 2 refills | Status: DC

## 2021-09-06 NOTE — Progress Notes (Signed)
Established Patient Office Visit     This visit occurred during the SARS-CoV-2 public health emergency.  Safety protocols were in place, including screening questions prior to the visit, additional usage of staff PPE, and extensive cleaning of exam room while observing appropriate contact time as indicated for disinfecting solutions.    CC/Reason for Visit: Follow-up blood pressure  HPI: Michaela [name] is a 71 y.o. female who is coming in today for the above mentioned reasons.  We have been monitoring her blood pressure due to multiple elevated in office measurements.  2 separate measurements in office today have been around 140/90.  I will insert ambulatory blood pressure measurements below.  She is feeling well and has no acute concerns today.  She is currently on amlodipine 5 mg and losartan 100 mg.     Past Medical/Surgical History: Past Medical History:  Diagnosis Date   Allergy    Arthritis    osteoarthritis   Foot deformity    saw podiatrist remotely   Heel spur    Hyperlipidemia    Hypertension    Migraine     Past Surgical History:  Procedure Laterality Date   no prior surgery      Social History:  reports that she quit smoking about 39 years ago. Her smoking use included cigarettes. She has never used smokeless tobacco. She reports current alcohol use of about 1.0 standard drink per week. She reports that she does not use drugs.  Allergies: Allergies  Allergen Reactions   Sulfonamide Derivatives Hives    Family History:  Family History  Problem Relation Age of Onset   Heart disease Mother    Stroke Mother    Early death Father        accidental death - trauma   Colon cancer Paternal Uncle 15   Stomach cancer Neg Hx      Current Outpatient Medications:    amLODipine (NORVASC) 5 MG tablet, Take 1 tablet (5 mg total) by mouth daily., Disp: 90 tablet, Rfl: 1   atorvastatin (LIPITOR) 40 MG tablet, TAKE ONE TABLET BY MOUTH DAILY AT 6PM, Disp: 90  tablet, Rfl: 1   cholecalciferol (VITAMIN D3) 25 MCG (1000 UNIT) tablet, Take 1,000 Units by mouth daily., Disp: , Rfl:    docusate sodium (COLACE) 100 MG capsule, Take 100 mg by mouth 2 (two) times daily., Disp: , Rfl:    fexofenadine (ALLEGRA) 180 MG tablet, Take 180 mg daily as needed by mouth for allergies or rhinitis., Disp: , Rfl:    Krill Oil 1000 MG CAPS, Take by mouth., Disp: , Rfl:    tretinoin (RETIN-A) 0.025 % cream, APPLY ONCE DAILY TO FACE AS DIRECTED, Disp: 45 g, Rfl: 3   fluticasone (FLONASE) 50 MCG/ACT nasal spray, USE 1 TO 2 SPRAYS INTO EACH NOSTRIL ONCE DAILY.  SHAKE WELL BEFORE USE, Disp: 16 mL, Rfl: 2   losartan (COZAAR) 100 MG tablet, Take 1 tablet (100 mg total) by mouth daily., Disp: 90 tablet, Rfl: 1  Review of Systems:  Constitutional: Denies fever, chills, diaphoresis, appetite change and fatigue.  HEENT: Denies photophobia, eye pain, redness, hearing loss, ear pain, congestion, sore throat, rhinorrhea, sneezing, mouth sores, trouble swallowing, neck pain, neck stiffness and tinnitus.   Respiratory: Denies SOB, DOE, cough, chest tightness,  and wheezing.   Cardiovascular: Denies chest pain, palpitations and leg swelling.  Gastrointestinal: Denies nausea, vomiting, abdominal pain, diarrhea, constipation, blood in stool and abdominal distention.  Genitourinary: Denies dysuria, urgency, frequency,  hematuria, flank pain and difficulty urinating.  Endocrine: Denies: hot or cold intolerance, sweats, changes in hair or nails, polyuria, polydipsia. Musculoskeletal: Denies myalgias, back pain, joint swelling, arthralgias and gait problem.  Skin: Denies pallor, rash and wound.  Neurological: Denies dizziness, seizures, syncope, weakness, light-headedness, numbness and headaches.  Hematological: Denies adenopathy. Easy bruising, personal or family bleeding history  Psychiatric/Behavioral: Denies suicidal ideation, mood changes, confusion, nervousness, sleep disturbance and  agitation    Physical Exam: Vitals:   09/06/21 0923  BP: 140/90  Pulse: 67  Temp: 98.4 F (36.9 C)  TempSrc: Oral  SpO2: 98%  Weight: 153 lb 11.2 oz (69.7 kg)    Body mass index is 28.57 kg/m.   Constitutional: NAD, calm, comfortable Eyes: PERRL, lids and conjunctivae normal, wears corrective lenses ENMT: Mucous membranes are moist.  Respiratory: clear to auscultation bilaterally, no wheezing, no crackles. Normal respiratory effort. No accessory muscle use.  Cardiovascular: Regular rate and rhythm, no murmurs / rubs / gallops. No extremity edema.  Neurologic: Grossly intact and nonfocal Psychiatric: Normal judgment and insight. Alert and oriented x 3. Normal mood.    Impression and Plan:  Non-seasonal allergic rhinitis, unspecified trigger  - Plan: fluticasone (FLONASE) 50 MCG/ACT nasal spray  Essential hypertension  - Plan: losartan (COZAAR) 100 MG tablet -With ambulatory measurements well within the range of normal, suspect an element of whitecoat syndrome. -Continue losartan 100 mg and amlodipine 5 mg daily. -Follow-up in 3 months.    Time spent: 21 minutes reviewing chart, interviewing and examining patient and formulating plan of care.     Lelon Frohlich, MD [facility] at [facility]

## 2021-11-07 ENCOUNTER — Other Ambulatory Visit: Payer: Self-pay | Admitting: Internal Medicine

## 2021-11-07 DIAGNOSIS — I10 Essential (primary) hypertension: Secondary | ICD-10-CM

## 2021-11-20 DIAGNOSIS — H04123 Dry eye syndrome of bilateral lacrimal glands: Secondary | ICD-10-CM | POA: Diagnosis not present

## 2021-11-20 DIAGNOSIS — H35033 Hypertensive retinopathy, bilateral: Secondary | ICD-10-CM | POA: Diagnosis not present

## 2021-11-20 DIAGNOSIS — H43813 Vitreous degeneration, bilateral: Secondary | ICD-10-CM | POA: Diagnosis not present

## 2021-11-20 DIAGNOSIS — H2513 Age-related nuclear cataract, bilateral: Secondary | ICD-10-CM | POA: Diagnosis not present

## 2021-11-21 DIAGNOSIS — Z1231 Encounter for screening mammogram for malignant neoplasm of breast: Secondary | ICD-10-CM | POA: Diagnosis not present

## 2021-11-21 LAB — HM MAMMOGRAPHY

## 2021-11-22 ENCOUNTER — Other Ambulatory Visit: Payer: Self-pay | Admitting: Internal Medicine

## 2021-11-22 DIAGNOSIS — E785 Hyperlipidemia, unspecified: Secondary | ICD-10-CM

## 2021-11-26 DIAGNOSIS — D3131 Benign neoplasm of right choroid: Secondary | ICD-10-CM | POA: Diagnosis not present

## 2021-11-26 DIAGNOSIS — H43393 Other vitreous opacities, bilateral: Secondary | ICD-10-CM | POA: Diagnosis not present

## 2021-11-26 DIAGNOSIS — H43813 Vitreous degeneration, bilateral: Secondary | ICD-10-CM | POA: Diagnosis not present

## 2021-11-26 DIAGNOSIS — H43822 Vitreomacular adhesion, left eye: Secondary | ICD-10-CM | POA: Diagnosis not present

## 2021-11-26 DIAGNOSIS — H33193 Other retinoschisis and retinal cysts, bilateral: Secondary | ICD-10-CM | POA: Diagnosis not present

## 2021-11-27 ENCOUNTER — Encounter: Payer: Self-pay | Admitting: Internal Medicine

## 2021-12-05 ENCOUNTER — Encounter: Payer: Self-pay | Admitting: Internal Medicine

## 2021-12-05 ENCOUNTER — Ambulatory Visit: Payer: Medicare PPO | Admitting: Internal Medicine

## 2021-12-05 VITALS — BP 123/61 | HR 63 | Temp 98.0°F | Ht 62.5 in | Wt 150.9 lb

## 2021-12-05 DIAGNOSIS — I10 Essential (primary) hypertension: Secondary | ICD-10-CM

## 2021-12-05 NOTE — Patient Instructions (Signed)
-  Nice seeing you today!!  -See you back in 6 months for your physical. 

## 2021-12-05 NOTE — Progress Notes (Signed)
? ? ? ?Established Patient Office Visit ? ? ? ? ?CC/Reason for Visit: Follow-up blood pressure ? ?HPI: Michaela [name] is a 71 y.o. female who is coming in today for the above mentioned reasons.  She is here today to follow-up on her blood pressure.  3 months ago she was noted to have elevated blood pressures in office but home measurements were well within range.  Today is similar.  2 in office blood pressures are 140/90 and 130/80.  She feels well and has no acute concerns or complaints today.  I will insert her ambulatory measurements below: ? ? ? ? ?Past Medical/Surgical History: ?Past Medical History:  ?Diagnosis Date  ? Allergy   ? Arthritis   ? osteoarthritis  ? Foot deformity   ? saw podiatrist remotely  ? Heel spur   ? Hyperlipidemia   ? Hypertension   ? Migraine   ? ? ?Past Surgical History:  ?Procedure Laterality Date  ? no prior surgery    ? ? ?Social History: ? reports that she quit smoking about 40 years ago. Her smoking use included cigarettes. She has never used smokeless tobacco. She reports current alcohol use of about 1.0 standard drink per week. She reports that she does not use drugs. ? ?Allergies: ?Allergies  ?Allergen Reactions  ? Sulfonamide Derivatives Hives  ? ? ?Family History:  ?Family History  ?Problem Relation Age of Onset  ? Heart disease Mother   ? Stroke Mother   ? Early death Father   ?     accidental death - trauma  ? Colon cancer Paternal Uncle 75  ? Stomach cancer Neg Hx   ? ? ? ?Current Outpatient Medications:  ?  amLODipine (NORVASC) 5 MG tablet, TAKE ONE TABLET BY MOUTH DAILY, Disp: 90 tablet, Rfl: 1 ?  atorvastatin (LIPITOR) 40 MG tablet, TAKE ONE TABLET BY MOUTH DAILY AT 6:00PM, Disp: 90 tablet, Rfl: 1 ?  cholecalciferol (VITAMIN D3) 25 MCG (1000 UNIT) tablet, Take 1,000 Units by mouth daily., Disp: , Rfl:  ?  docusate sodium (COLACE) 100 MG capsule, Take 100 mg by mouth 2 (two) times daily., Disp: , Rfl:  ?  fexofenadine (ALLEGRA) 180 MG tablet, Take 180 mg daily as needed by  mouth for allergies or rhinitis., Disp: , Rfl:  ?  fluticasone (FLONASE) 50 MCG/ACT nasal spray, USE 1 TO 2 SPRAYS INTO EACH NOSTRIL ONCE DAILY.  SHAKE WELL BEFORE USE, Disp: 16 mL, Rfl: 2 ?  Krill Oil 1000 MG CAPS, Take by mouth., Disp: , Rfl:  ?  losartan (COZAAR) 100 MG tablet, Take 1 tablet (100 mg total) by mouth daily., Disp: 90 tablet, Rfl: 1 ?  tretinoin (RETIN-A) 0.025 % cream, APPLY ONCE DAILY TO FACE AS DIRECTED, Disp: 45 g, Rfl: 3 ? ?Review of Systems:  ?Constitutional: Denies fever, chills, diaphoresis, appetite change and fatigue.  ?HEENT: Denies photophobia, eye pain, redness, hearing loss, ear pain, congestion, sore throat, rhinorrhea, sneezing, mouth sores, trouble swallowing, neck pain, neck stiffness and tinnitus.   ?Respiratory: Denies SOB, DOE, cough, chest tightness,  and wheezing.   ?Cardiovascular: Denies chest pain, palpitations and leg swelling.  ?Gastrointestinal: Denies nausea, vomiting, abdominal pain, diarrhea, constipation, blood in stool and abdominal distention.  ?Genitourinary: Denies dysuria, urgency, frequency, hematuria, flank pain and difficulty urinating.  ?Endocrine: Denies: hot or cold intolerance, sweats, changes in hair or nails, polyuria, polydipsia. ?Musculoskeletal: Denies myalgias, back pain, joint swelling, arthralgias and gait problem.  ?Skin: Denies pallor, rash and wound.  ?Neurological:  Denies dizziness, seizures, syncope, weakness, light-headedness, numbness and headaches.  ?Hematological: Denies adenopathy. Easy bruising, personal or family bleeding history  ?Psychiatric/Behavioral: Denies suicidal ideation, mood changes, confusion, nervousness, sleep disturbance and agitation ? ? ? ?Physical Exam: ?Vitals:  ? 12/05/21 1000 12/05/21 1005  ?BP: 140/90 130/80  ?Pulse: 63   ?Temp: 98 ?F (36.7 ?C)   ?TempSrc: Oral   ?SpO2: 98%   ?Weight: 150 lb 14.4 oz (68.4 kg)   ?Height: 5' 2.5" (1.588 m)   ? ? ?Body mass index is 27.16 kg/m?. ? ? ?Constitutional: NAD, calm,  comfortable ?Eyes: PERRL, lids and conjunctivae normal, wears corrective lenses ?ENMT: Mucous membranes are moist.  ?Respiratory: clear to auscultation bilaterally, no wheezing, no crackles. Normal respiratory effort. No accessory muscle use.  ?Cardiovascular: Regular rate and rhythm, no murmurs / rubs / gallops. No extremity edema.  ?Psychiatric: Normal judgment and insight. Alert and oriented x 3. Normal mood.  ? ? ?Impression and Plan: ? ?Essential hypertension ?-Well-documented whitecoat syndrome.  She is well controlled on amlodipine 5 mg and losartan 100 mg.  No changes to be made today based on ambulatory measurements. ? ? ?Time spent:21 minutes reviewing chart, interviewing and examining patient and formulating plan of care. ? ? ?Patient Instructions  ?-Nice seeing you today!! ? ?-See you back in 6 months for your physical. ? ? ? ?Chaya Jan, MD ?[facility] at [facility] ? ? ?

## 2022-03-14 DIAGNOSIS — L309 Dermatitis, unspecified: Secondary | ICD-10-CM | POA: Diagnosis not present

## 2022-03-30 ENCOUNTER — Other Ambulatory Visit: Payer: Self-pay | Admitting: Internal Medicine

## 2022-03-30 DIAGNOSIS — J3089 Other allergic rhinitis: Secondary | ICD-10-CM

## 2022-04-17 ENCOUNTER — Encounter: Payer: Self-pay | Admitting: Internal Medicine

## 2022-04-17 ENCOUNTER — Ambulatory Visit: Payer: Medicare PPO | Admitting: Internal Medicine

## 2022-04-17 VITALS — BP 124/80 | HR 97 | Temp 98.4°F | Wt 157.4 lb

## 2022-04-17 DIAGNOSIS — N3001 Acute cystitis with hematuria: Secondary | ICD-10-CM | POA: Diagnosis not present

## 2022-04-17 LAB — POCT URINALYSIS DIPSTICK
Bilirubin, UA: NEGATIVE
Blood, UA: POSITIVE
Glucose, UA: NEGATIVE
Ketones, UA: NEGATIVE
Nitrite, UA: POSITIVE
Protein, UA: NEGATIVE
Spec Grav, UA: 1.015 (ref 1.010–1.025)
Urobilinogen, UA: 1 E.U./dL
pH, UA: 6 (ref 5.0–8.0)

## 2022-04-17 MED ORDER — CIPROFLOXACIN HCL 500 MG PO TABS: 500.0000 mg | ORAL_TABLET | Freq: Two times a day (BID) | ORAL | 0 refills | Status: AC

## 2022-04-17 NOTE — Progress Notes (Signed)
Established Patient Office Visit     CC/Reason for Visit: Dysuria  HPI: Michaela [name] is a 71 y.o. female who is coming in today for the above mentioned reasons.  For the past day she has been experiencing dysuria and urinary frequency.  No fever or change in color or odor of urine.  Past Medical/Surgical History: Past Medical History:  Diagnosis Date   Allergy    Arthritis    osteoarthritis   Foot deformity    saw podiatrist remotely   Heel spur    Hyperlipidemia    Hypertension    Migraine     Past Surgical History:  Procedure Laterality Date   no prior surgery      Social History:  reports that she quit smoking about 40 years ago. Her smoking use included cigarettes. She has never used smokeless tobacco. She reports current alcohol use of about 1.0 standard drink of alcohol per week. She reports that she does not use drugs.  Allergies: Allergies  Allergen Reactions   Sulfonamide Derivatives Hives    Family History:  Family History  Problem Relation Age of Onset   Heart disease Mother    Stroke Mother    Early death Father        accidental death - trauma   Colon cancer Paternal Uncle 76   Stomach cancer Neg Hx      Current Outpatient Medications:    amLODipine (NORVASC) 5 MG tablet, TAKE ONE TABLET BY MOUTH DAILY, Disp: 90 tablet, Rfl: 1   atorvastatin (LIPITOR) 40 MG tablet, TAKE ONE TABLET BY MOUTH DAILY AT 6:00PM, Disp: 90 tablet, Rfl: 1   cholecalciferol (VITAMIN D3) 25 MCG (1000 UNIT) tablet, Take 1,000 Units by mouth daily., Disp: , Rfl:    ciprofloxacin (CIPRO) 500 MG tablet, Take 1 tablet (500 mg total) by mouth 2 (two) times daily for 7 days., Disp: 14 tablet, Rfl: 0   docusate sodium (COLACE) 100 MG capsule, Take 100 mg by mouth 2 (two) times daily., Disp: , Rfl:    fexofenadine (ALLEGRA) 180 MG tablet, Take 180 mg daily as needed by mouth for allergies or rhinitis., Disp: , Rfl:    fluticasone (FLONASE) 50 MCG/ACT nasal spray, USE 1 TO 2  SPRAYS INTO EACH NOSTRIL ONCE DAILY.  SHAKE WELL BEFORE USE, Disp: 16 mL, Rfl: 2   Krill Oil 1000 MG CAPS, Take by mouth., Disp: , Rfl:    losartan (COZAAR) 100 MG tablet, Take 1 tablet (100 mg total) by mouth daily., Disp: 90 tablet, Rfl: 1   tretinoin (RETIN-A) 0.025 % cream, APPLY ONCE DAILY TO FACE AS DIRECTED, Disp: 45 g, Rfl: 3  Review of Systems:  Constitutional: Denies fever, chills, diaphoresis, appetite change and fatigue.  HEENT: Denies photophobia, eye pain, redness, hearing loss, ear pain, congestion, sore throat, rhinorrhea, sneezing, mouth sores, trouble swallowing, neck pain, neck stiffness and tinnitus.   Respiratory: Denies SOB, DOE, cough, chest tightness,  and wheezing.   Cardiovascular: Denies chest pain, palpitations and leg swelling.  Gastrointestinal: Denies nausea, vomiting, abdominal pain, diarrhea, constipation, blood in stool and abdominal distention.  Genitourinary: Denies hematuria, flank pain. Endocrine: Denies: hot or cold intolerance, sweats, changes in hair or nails, polyuria, polydipsia. Musculoskeletal: Denies myalgias, back pain, joint swelling, arthralgias and gait problem.  Skin: Denies pallor, rash and wound.  Neurological: Denies dizziness, seizures, syncope, weakness, light-headedness, numbness and headaches.  Hematological: Denies adenopathy. Easy bruising, personal or family bleeding history  Psychiatric/Behavioral: Denies suicidal ideation,  mood changes, confusion, nervousness, sleep disturbance and agitation    Physical Exam: Vitals:   04/17/22 1535  BP: 124/80  Pulse: 97  Temp: 98.4 F (36.9 C)  TempSrc: Oral  SpO2: 98%  Weight: 157 lb 6.4 oz (71.4 kg)    Body mass index is 28.33 kg/m.   Constitutional: NAD, calm, comfortable Eyes: PERRL, lids and conjunctivae normal, wears corrective lenses ENMT: Mucous membranes are moist.   Psychiatric: Normal judgment and insight. Alert and oriented x 3. Normal mood.    Impression and  Plan:  Acute cystitis with hematuria - Plan: POCT urinalysis dipstick, Urine Culture, Urinalysis, ciprofloxacin (CIPRO) 500 MG tablet  -In office urine dipstick is positive for blood, leukocytes and nitrates. -Since she has a sulfa allergy I will prescribe Cipro for her to take twice daily for 7 days, urine will be sent for culture.  Time spent:22 minutes reviewing chart, interviewing and examining patient and formulating plan of care.     Chaya Jan, MD [facility] at [facility]

## 2022-04-18 LAB — URINALYSIS, ROUTINE W REFLEX MICROSCOPIC
Bilirubin Urine: NEGATIVE
Ketones, ur: NEGATIVE
Nitrite: POSITIVE — AB
Specific Gravity, Urine: 1.01 (ref 1.000–1.030)
Total Protein, Urine: NEGATIVE
Urine Glucose: NEGATIVE
Urobilinogen, UA: 1 (ref 0.0–1.0)
pH: 6 (ref 5.0–8.0)

## 2022-04-20 LAB — URINE CULTURE
MICRO NUMBER:: 13914702
SPECIMEN QUALITY:: ADEQUATE

## 2022-05-04 ENCOUNTER — Other Ambulatory Visit: Payer: Self-pay | Admitting: Internal Medicine

## 2022-05-04 DIAGNOSIS — I10 Essential (primary) hypertension: Secondary | ICD-10-CM

## 2022-05-18 ENCOUNTER — Other Ambulatory Visit: Payer: Self-pay | Admitting: Internal Medicine

## 2022-05-18 DIAGNOSIS — E785 Hyperlipidemia, unspecified: Secondary | ICD-10-CM

## 2022-06-05 DIAGNOSIS — H43822 Vitreomacular adhesion, left eye: Secondary | ICD-10-CM | POA: Diagnosis not present

## 2022-06-05 DIAGNOSIS — H43813 Vitreous degeneration, bilateral: Secondary | ICD-10-CM | POA: Diagnosis not present

## 2022-06-05 DIAGNOSIS — D3131 Benign neoplasm of right choroid: Secondary | ICD-10-CM | POA: Diagnosis not present

## 2022-06-05 DIAGNOSIS — H43393 Other vitreous opacities, bilateral: Secondary | ICD-10-CM | POA: Diagnosis not present

## 2022-06-05 DIAGNOSIS — H33193 Other retinoschisis and retinal cysts, bilateral: Secondary | ICD-10-CM | POA: Diagnosis not present

## 2022-06-11 ENCOUNTER — Encounter: Payer: Self-pay | Admitting: Internal Medicine

## 2022-06-11 ENCOUNTER — Ambulatory Visit (INDEPENDENT_AMBULATORY_CARE_PROVIDER_SITE_OTHER): Payer: Medicare PPO | Admitting: Internal Medicine

## 2022-06-11 VITALS — BP 120/78 | HR 70 | Temp 98.1°F | Ht 62.5 in | Wt 160.8 lb

## 2022-06-11 DIAGNOSIS — Z0000 Encounter for general adult medical examination without abnormal findings: Secondary | ICD-10-CM

## 2022-06-11 DIAGNOSIS — Z13820 Encounter for screening for osteoporosis: Secondary | ICD-10-CM

## 2022-06-11 DIAGNOSIS — I10 Essential (primary) hypertension: Secondary | ICD-10-CM

## 2022-06-11 DIAGNOSIS — E559 Vitamin D deficiency, unspecified: Secondary | ICD-10-CM | POA: Diagnosis not present

## 2022-06-11 DIAGNOSIS — E785 Hyperlipidemia, unspecified: Secondary | ICD-10-CM

## 2022-06-11 LAB — CBC WITH DIFFERENTIAL/PLATELET
Basophils Absolute: 0 10*3/uL (ref 0.0–0.1)
Basophils Relative: 0.6 % (ref 0.0–3.0)
Eosinophils Absolute: 0.1 10*3/uL (ref 0.0–0.7)
Eosinophils Relative: 1.7 % (ref 0.0–5.0)
HCT: 41.6 % (ref 36.0–46.0)
Hemoglobin: 13.9 g/dL (ref 12.0–15.0)
Lymphocytes Relative: 21.8 % (ref 12.0–46.0)
Lymphs Abs: 1.3 10*3/uL (ref 0.7–4.0)
MCHC: 33.5 g/dL (ref 30.0–36.0)
MCV: 91.7 fl (ref 78.0–100.0)
Monocytes Absolute: 0.4 10*3/uL (ref 0.1–1.0)
Monocytes Relative: 7.6 % (ref 3.0–12.0)
Neutro Abs: 4 10*3/uL (ref 1.4–7.7)
Neutrophils Relative %: 68.3 % (ref 43.0–77.0)
Platelets: 195 10*3/uL (ref 150.0–400.0)
RBC: 4.53 Mil/uL (ref 3.87–5.11)
RDW: 13.4 % (ref 11.5–15.5)
WBC: 5.8 10*3/uL (ref 4.0–10.5)

## 2022-06-11 LAB — LIPID PANEL
Cholesterol: 197 mg/dL (ref 0–200)
HDL: 109 mg/dL (ref >39.00)
LDL Cholesterol: 80 mg/dL (ref 0–99)
NonHDL: 88.07
Total CHOL/HDL Ratio: 2
Triglycerides: 40 mg/dL (ref 0.0–149.0)
VLDL: 8 mg/dL (ref 0.0–40.0)

## 2022-06-11 LAB — COMPREHENSIVE METABOLIC PANEL
ALT: 20 U/L (ref 0–35)
AST: 21 U/L (ref 0–37)
Albumin: 4.8 g/dL (ref 3.5–5.2)
Alkaline Phosphatase: 43 U/L (ref 39–117)
BUN: 15 mg/dL (ref 6–23)
CO2: 26 mEq/L (ref 19–32)
Calcium: 9.6 mg/dL (ref 8.4–10.5)
Chloride: 104 mEq/L (ref 96–112)
Creatinine, Ser: 0.71 mg/dL (ref 0.40–1.20)
GFR: 85.81 mL/min (ref >60.00)
Glucose, Bld: 81 mg/dL (ref 70–99)
Potassium: 4.6 mEq/L (ref 3.5–5.1)
Sodium: 139 mEq/L (ref 135–145)
Total Bilirubin: 0.7 mg/dL (ref 0.2–1.2)
Total Protein: 7 g/dL (ref 6.0–8.3)

## 2022-06-11 LAB — HEMOGLOBIN A1C: Hgb A1c MFr Bld: 5.1 % (ref 4.6–6.5)

## 2022-06-11 LAB — VITAMIN D 25 HYDROXY (VIT D DEFICIENCY, FRACTURES): VITD: 43.77 ng/mL (ref 30.00–100.00)

## 2022-06-11 NOTE — Progress Notes (Signed)
Established Patient Office Visit     CC/Reason for Visit: Annual preventive exam and subsequent Medicare wellness visit  HPI: Michaela [name] is a 71 y.o. female who is coming in today for the above mentioned reasons. Past Medical History is significant for: Hypertension, hyperlipidemia and vitamin D deficiency.  She has routine eye and dental care.  No perceived hearing difficulty, all immunizations are up-to-date.   Past Medical/Surgical History: Past Medical History:  Diagnosis Date   Allergy    Arthritis    osteoarthritis   Foot deformity    saw podiatrist remotely   Heel spur    Hyperlipidemia    Hypertension    Migraine     Past Surgical History:  Procedure Laterality Date   no prior surgery      Social History:  reports that she quit smoking about 40 years ago. Her smoking use included cigarettes. She has never used smokeless tobacco. She reports current alcohol use of about 1.0 standard drink of alcohol per week. She reports that she does not use drugs.  Allergies: Allergies  Allergen Reactions   Sulfonamide Derivatives Hives    Family History:  Family History  Problem Relation Age of Onset   Heart disease Mother    Stroke Mother    Early death Father        accidental death - trauma   Colon cancer Paternal Uncle 61   Stomach cancer Neg Hx      Current Outpatient Medications:    amLODipine (NORVASC) 5 MG tablet, TAKE ONE TABLET BY MOUTH DAILY, Disp: 90 tablet, Rfl: 1   atorvastatin (LIPITOR) 40 MG tablet, TAKE ONE TABLET BY MOUTH DAILY AT 6:00PM, Disp: 90 tablet, Rfl: 0   cholecalciferol (VITAMIN D3) 25 MCG (1000 UNIT) tablet, Take 1,000 Units by mouth daily., Disp: , Rfl:    docusate sodium (COLACE) 100 MG capsule, Take 100 mg by mouth 2 (two) times daily., Disp: , Rfl:    fexofenadine (ALLEGRA) 180 MG tablet, Take 180 mg daily as needed by mouth for allergies or rhinitis., Disp: , Rfl:    fluticasone (FLONASE) 50 MCG/ACT nasal spray, USE 1 TO 2  SPRAYS INTO EACH NOSTRIL ONCE DAILY.  SHAKE WELL BEFORE USE, Disp: 16 mL, Rfl: 2   Krill Oil 1000 MG CAPS, Take by mouth., Disp: , Rfl:    losartan (COZAAR) 100 MG tablet, Take 1 tablet (100 mg total) by mouth daily., Disp: 90 tablet, Rfl: 1   tretinoin (RETIN-A) 0.025 % cream, APPLY ONCE DAILY TO FACE AS DIRECTED, Disp: 45 g, Rfl: 3  Review of Systems:  Constitutional: Denies fever, chills, diaphoresis, appetite change and fatigue.  HEENT: Denies photophobia, eye pain, redness, hearing loss, ear pain, congestion, sore throat, rhinorrhea, sneezing, mouth sores, trouble swallowing, neck pain, neck stiffness and tinnitus.   Respiratory: Denies SOB, DOE, cough, chest tightness,  and wheezing.   Cardiovascular: Denies chest pain, palpitations and leg swelling.  Gastrointestinal: Denies nausea, vomiting, abdominal pain, diarrhea, constipation, blood in stool and abdominal distention.  Genitourinary: Denies dysuria, urgency, frequency, hematuria, flank pain and difficulty urinating.  Endocrine: Denies: hot or cold intolerance, sweats, changes in hair or nails, polyuria, polydipsia. Musculoskeletal: Denies myalgias, back pain, joint swelling, arthralgias and gait problem.  Skin: Denies pallor, rash and wound.  Neurological: Denies dizziness, seizures, syncope, weakness, light-headedness, numbness and headaches.  Hematological: Denies adenopathy. Easy bruising, personal or family bleeding history  Psychiatric/Behavioral: Denies suicidal ideation, mood changes, confusion, nervousness, sleep disturbance and  agitation    Physical Exam: Vitals:   06/11/22 0853 06/11/22 0902  BP: 120/84 120/78  Pulse: 70   Temp: 98.1 F (36.7 C)   TempSrc: Oral   SpO2: 99%   Weight: 160 lb 12.8 oz (72.9 kg)   Height: 5' 2.5" (1.588 m)     Body mass index is 28.94 kg/m.   Constitutional: NAD, calm, comfortable Eyes: PERRL, lids and conjunctivae normal, wears corrective lenses ENMT: Mucous membranes are  moist. Posterior pharynx clear of any exudate or lesions. Normal dentition. Tympanic membrane is pearly white, no erythema or bulging. Neck: normal, supple, no masses, no thyromegaly Respiratory: clear to auscultation bilaterally, no wheezing, no crackles. Normal respiratory effort. No accessory muscle use.  Cardiovascular: Regular rate and rhythm, no murmurs / rubs / gallops. No extremity edema. 2+ pedal pulses. No carotid bruits.  Abdomen: no tenderness, no masses palpated. No hepatosplenomegaly. Bowel sounds positive.  Musculoskeletal: no clubbing / cyanosis. No joint deformity upper and lower extremities. Good ROM, no contractures. Normal muscle tone.  Skin: no rashes, lesions, ulcers. No induration Neurologic: CN 2-12 grossly intact. Sensation intact, DTR normal. Strength 5/5 in all 4.  Psychiatric: Normal judgment and insight. Alert and oriented x 3. Normal mood.    Subsequent Medicare wellness visit   1. Risk factors, based on past  M,S,F -cardiovascular disease risk factors include age as well as history of hypertension and hyperlipidemia   2.  Physical activities: Walks on a daily basis   3.  Depression/mood: Stable, not depressed   4.  Hearing: No perceived issues   5.  ADL's: Independent in all ADLs   6.  Fall risk: Low fall risk   7.  Home safety: No problems identified   8.  Height weight, and visual acuity: height and weight as above, vision:  Vision Screening   Right eye Left eye Both eyes  Without correction     With correction 20/20 20/20 20/20      9.  Counseling: Advised to update RSV vaccine and to increase physical activity   10. Lab orders based on risk factors: Laboratory update will be reviewed   11. Referral : DEXA   12. Care plan: Follow-up with me in 6 months   13. Cognitive assessment: No cognitive impairment   14. Screening: Patient provided with a written and personalized 5-10 year screening schedule in the AVS. yes   15. Provider List  Update: PCP, ophthalmologist  16. Advance Directives: Full code   17. Opioids: Patient is not on any opioid prescriptions and has no risk factors for a substance use disorder.   [place] Visit from 12/05/2021 in [facility]  PHQ-9 Total Score 1          01/11/2021   10:10 AM 05/24/2021    9:02 AM 07/19/2021    9:32 AM 09/06/2021    9:29 AM 12/05/2021   10:04 AM  Fall Risk  Falls in the past year? 0 0 1 0 1  Was there an injury with Fall? 0 0 0 0 0  Fall Risk Category Calculator 0 0 1 0 1  Fall Risk Category Low Low Low Low Low  Patient Fall Risk Level   Low fall risk Low fall risk Low fall risk  Patient at Risk for Falls Due to     No Fall Risks  Fall risk Follow up    Falls evaluation completed Falls evaluation completed     Impression and Plan:  Encounter  for preventive health examination  Vitamin D deficiency - Plan: DG Bone Density, VITAMIN D 25 Hydroxy (Vit-D Deficiency, Fractures)  Screening for osteoporosis - Plan: DG Bone Density  Hyperlipidemia, unspecified hyperlipidemia type - Plan: Lipid panel  Essential hypertension - Plan: CBC with Differential/Platelet, Comprehensive metabolic panel, Hemoglobin A1c   -Recommend routine eye and dental care. -Immunizations: All immunizations are up-to-date, we have discussed RSV vaccine and she will consider -Healthy lifestyle discussed in detail. -Labs to be updated today. -Colon cancer screening: 04/2013 -Breast cancer screening: 11/2021 -Cervical cancer screening: 08/2020 -Lung cancer screening: Not applicable -Prostate cancer screening: Not applicable -DEXA: Due, will order     Hy Swiatek Philip Aspen, MD [facility]

## 2022-06-17 ENCOUNTER — Other Ambulatory Visit: Payer: Self-pay | Admitting: Internal Medicine

## 2022-06-17 DIAGNOSIS — I10 Essential (primary) hypertension: Secondary | ICD-10-CM

## 2022-06-18 ENCOUNTER — Ambulatory Visit (INDEPENDENT_AMBULATORY_CARE_PROVIDER_SITE_OTHER)
Admission: RE | Admit: 2022-06-18 | Discharge: 2022-06-18 | Disposition: A | Discharge status: Home / Self Care | Payer: Medicare PPO | Source: Ambulatory Visit | Attending: Internal Medicine | Admitting: Internal Medicine

## 2022-06-18 DIAGNOSIS — Z13820 Encounter for screening for osteoporosis: Secondary | ICD-10-CM

## 2022-06-18 DIAGNOSIS — E559 Vitamin D deficiency, unspecified: Secondary | ICD-10-CM

## 2022-06-20 ENCOUNTER — Inpatient Hospital Stay: Admission: RE | Admit: 2022-06-20 | Payer: Medicare PPO | Source: Ambulatory Visit

## 2022-06-21 ENCOUNTER — Encounter: Payer: Self-pay | Admitting: Internal Medicine

## 2022-08-15 ENCOUNTER — Other Ambulatory Visit: Payer: Self-pay | Admitting: Internal Medicine

## 2022-08-15 DIAGNOSIS — E785 Hyperlipidemia, unspecified: Secondary | ICD-10-CM

## 2022-10-30 ENCOUNTER — Other Ambulatory Visit: Payer: Self-pay | Admitting: Internal Medicine

## 2022-10-30 DIAGNOSIS — I10 Essential (primary) hypertension: Secondary | ICD-10-CM

## 2022-11-08 ENCOUNTER — Other Ambulatory Visit: Payer: Self-pay | Admitting: Internal Medicine

## 2022-11-08 DIAGNOSIS — J3089 Other allergic rhinitis: Secondary | ICD-10-CM

## 2022-11-29 DIAGNOSIS — H43813 Vitreous degeneration, bilateral: Secondary | ICD-10-CM | POA: Diagnosis not present

## 2022-11-29 DIAGNOSIS — D3131 Benign neoplasm of right choroid: Secondary | ICD-10-CM | POA: Diagnosis not present

## 2022-11-29 DIAGNOSIS — H5203 Hypermetropia, bilateral: Secondary | ICD-10-CM | POA: Diagnosis not present

## 2022-11-29 DIAGNOSIS — H35033 Hypertensive retinopathy, bilateral: Secondary | ICD-10-CM | POA: Diagnosis not present

## 2022-11-29 DIAGNOSIS — H52222 Regular astigmatism, left eye: Secondary | ICD-10-CM | POA: Diagnosis not present

## 2022-11-29 DIAGNOSIS — H524 Presbyopia: Secondary | ICD-10-CM | POA: Diagnosis not present

## 2022-11-29 DIAGNOSIS — H2513 Age-related nuclear cataract, bilateral: Secondary | ICD-10-CM | POA: Diagnosis not present

## 2022-12-14 ENCOUNTER — Other Ambulatory Visit: Payer: Self-pay | Admitting: Internal Medicine

## 2022-12-14 DIAGNOSIS — I10 Essential (primary) hypertension: Secondary | ICD-10-CM

## 2022-12-16 ENCOUNTER — Encounter: Payer: Self-pay | Admitting: Internal Medicine

## 2022-12-16 ENCOUNTER — Ambulatory Visit: Payer: Medicare PPO | Admitting: Internal Medicine

## 2022-12-16 VITALS — BP 110/80 | HR 64 | Temp 98.2°F | Wt 160.9 lb

## 2022-12-16 DIAGNOSIS — I10 Essential (primary) hypertension: Secondary | ICD-10-CM

## 2022-12-16 DIAGNOSIS — Z1231 Encounter for screening mammogram for malignant neoplasm of breast: Secondary | ICD-10-CM | POA: Diagnosis not present

## 2022-12-16 DIAGNOSIS — G8929 Other chronic pain: Secondary | ICD-10-CM | POA: Diagnosis not present

## 2022-12-16 DIAGNOSIS — J3089 Other allergic rhinitis: Secondary | ICD-10-CM | POA: Diagnosis not present

## 2022-12-16 DIAGNOSIS — E785 Hyperlipidemia, unspecified: Secondary | ICD-10-CM | POA: Diagnosis not present

## 2022-12-16 DIAGNOSIS — E559 Vitamin D deficiency, unspecified: Secondary | ICD-10-CM | POA: Diagnosis not present

## 2022-12-16 DIAGNOSIS — M25511 Pain in right shoulder: Secondary | ICD-10-CM

## 2022-12-16 LAB — HM MAMMOGRAPHY

## 2022-12-16 NOTE — Assessment & Plan Note (Signed)
Well controlled on atorvastatin 40 mg daily.  

## 2022-12-16 NOTE — Assessment & Plan Note (Signed)
Check levels with next lab draw.

## 2022-12-16 NOTE — Assessment & Plan Note (Signed)
Well controlled on amlodipine and losartan

## 2022-12-16 NOTE — Progress Notes (Signed)
Established Patient Office Visit     CC/Reason for Visit: 60-month follow-up chronic medical conditions and discuss acute concern  HPI: Michaela [name] is a 72 y.o. female who is coming in today for the above mentioned reasons. Past Medical History is significant for: Hypertension, hyperlipidemia, vitamin D deficiency.  She is adherent to medical therapy.  Blood pressure has been well-controlled.  She also remains on atorvastatin with an LDL of 80 as of November 2023.  She has had approximately 6 years worth of right shoulder pain that is low-grade.  Lately she has noticed some clicking when moving her arm.  She still has full range of motion.   Past Medical/Surgical History: Past Medical History:  Diagnosis Date   Allergy    Arthritis    osteoarthritis   Foot deformity    saw podiatrist remotely   Heel spur    Hyperlipidemia    Hypertension    Migraine     Past Surgical History:  Procedure Laterality Date   no prior surgery      Social History:  reports that she quit smoking about 41 years ago. Her smoking use included cigarettes. She has never used smokeless tobacco. She reports current alcohol use of about 1.0 standard drink of alcohol per week. She reports that she does not use drugs.  Allergies: Allergies  Allergen Reactions   Sulfonamide Derivatives Hives    Family History:  Family History  Problem Relation Age of Onset   Heart disease Mother    Stroke Mother    Early death Father        accidental death - trauma   Colon cancer Paternal Uncle 59   Stomach cancer Neg Hx      Current Outpatient Medications:    amLODipine (NORVASC) 5 MG tablet, TAKE 1 TABLET BY MOUTH DAILY, Disp: 90 tablet, Rfl: 1   atorvastatin (LIPITOR) 40 MG tablet, TAKE ONE TABLET BY MOUTH EVERY EVENING AT 6PM, Disp: 90 tablet, Rfl: 1   cholecalciferol (VITAMIN D3) 25 MCG (1000 UNIT) tablet, Take 1,000 Units by mouth daily., Disp: , Rfl:    docusate sodium (COLACE) 100 MG capsule, Take  100 mg by mouth 2 (two) times daily., Disp: , Rfl:    fexofenadine (ALLEGRA) 180 MG tablet, Take 180 mg daily as needed by mouth for allergies or rhinitis., Disp: , Rfl:    fluticasone (FLONASE) 50 MCG/ACT nasal spray, SPRAY ONE TO TWO SPRAYS IN EACH NOSTRIL DAILY *SHAKE WELL BEFORE USE*, Disp: 16 mL, Rfl: 2   Krill Oil 1000 MG CAPS, Take by mouth., Disp: , Rfl:    losartan (COZAAR) 100 MG tablet, TAKE ONE TABLET BY MOUTH DAILY, Disp: 90 tablet, Rfl: 1   tretinoin (RETIN-A) 0.025 % cream, APPLY ONCE DAILY TO FACE AS DIRECTED, Disp: 45 g, Rfl: 3  Review of Systems:  Negative unless indicated in HPI.   Physical Exam: Vitals:   12/16/22 0931  BP: 110/80  Pulse: 64  Temp: 98.2 F (36.8 C)  TempSrc: Oral  SpO2: 99%  Weight: 160 lb 14.4 oz (73 kg)    Body mass index is 28.96 kg/m.   Physical Exam Vitals reviewed.  Constitutional:      Appearance: Normal appearance.  HENT:     Head: Normocephalic and atraumatic.  Eyes:     Conjunctiva/sclera: Conjunctivae normal.     Pupils: Pupils are equal, round, and reactive to light.  Cardiovascular:     Rate and Rhythm: Normal rate and regular  rhythm.  Pulmonary:     Effort: Pulmonary effort is normal.     Breath sounds: Normal breath sounds.  Skin:    General: Skin is warm and dry.  Neurological:     General: No focal deficit present.     Mental Status: She is alert and oriented to person, place, and time.  Psychiatric:        Mood and Affect: Mood normal.        Behavior: Behavior normal.        Thought Content: Thought content normal.        Judgment: Judgment normal.      Impression and Plan:  Hyperlipidemia, unspecified hyperlipidemia type Assessment & Plan: Well controlled on atorvastatin 40 mg daily.   Essential hypertension Assessment & Plan: Well controlled on amlodipine and losartan.   Non-seasonal allergic rhinitis, unspecified trigger Assessment & Plan: On allegra and flonase.   Vitamin D  deficiency Assessment & Plan: Check levels with next lab draw.   Chronic right shoulder pain -     Ambulatory referral to Orthopedic Surgery   -Likely arthritis and could benefit from intra-articular steroids.   Time spent:32 minutes reviewing chart, interviewing and examining patient and formulating plan of care. Meds have been refilled and we have discussed healthy lifestyle changes.     Chaya Jan, MD [facility]

## 2022-12-16 NOTE — Assessment & Plan Note (Signed)
On allegra and flonase.   ?

## 2022-12-17 ENCOUNTER — Encounter: Payer: Self-pay | Admitting: Internal Medicine

## 2023-01-13 DIAGNOSIS — M19011 Primary osteoarthritis, right shoulder: Secondary | ICD-10-CM | POA: Diagnosis not present

## 2023-02-27 ENCOUNTER — Other Ambulatory Visit: Payer: Self-pay | Admitting: Internal Medicine

## 2023-02-27 DIAGNOSIS — L309 Dermatitis, unspecified: Secondary | ICD-10-CM | POA: Diagnosis not present

## 2023-02-27 DIAGNOSIS — D225 Melanocytic nevi of trunk: Secondary | ICD-10-CM | POA: Diagnosis not present

## 2023-02-27 DIAGNOSIS — E785 Hyperlipidemia, unspecified: Secondary | ICD-10-CM

## 2023-02-27 DIAGNOSIS — L821 Other seborrheic keratosis: Secondary | ICD-10-CM | POA: Diagnosis not present

## 2023-02-27 DIAGNOSIS — L814 Other melanin hyperpigmentation: Secondary | ICD-10-CM | POA: Diagnosis not present

## 2023-03-07 ENCOUNTER — Encounter: Payer: Self-pay | Admitting: Internal Medicine

## 2023-05-01 ENCOUNTER — Other Ambulatory Visit: Payer: Self-pay | Admitting: Internal Medicine

## 2023-05-01 DIAGNOSIS — I10 Essential (primary) hypertension: Secondary | ICD-10-CM

## 2023-05-09 ENCOUNTER — Other Ambulatory Visit: Payer: Self-pay | Admitting: Internal Medicine

## 2023-05-09 DIAGNOSIS — Z1212 Encounter for screening for malignant neoplasm of rectum: Secondary | ICD-10-CM

## 2023-05-09 DIAGNOSIS — Z1211 Encounter for screening for malignant neoplasm of colon: Secondary | ICD-10-CM

## 2023-05-30 ENCOUNTER — Encounter: Payer: Self-pay | Admitting: Internal Medicine

## 2023-06-07 DIAGNOSIS — Z1211 Encounter for screening for malignant neoplasm of colon: Secondary | ICD-10-CM | POA: Diagnosis not present

## 2023-06-07 DIAGNOSIS — Z1212 Encounter for screening for malignant neoplasm of rectum: Secondary | ICD-10-CM | POA: Diagnosis not present

## 2023-06-11 ENCOUNTER — Other Ambulatory Visit: Payer: Self-pay | Admitting: Internal Medicine

## 2023-06-11 DIAGNOSIS — I10 Essential (primary) hypertension: Secondary | ICD-10-CM

## 2023-06-15 LAB — COLOGUARD: COLOGUARD: NEGATIVE

## 2023-06-23 DIAGNOSIS — H35033 Hypertensive retinopathy, bilateral: Secondary | ICD-10-CM | POA: Diagnosis not present

## 2023-06-23 DIAGNOSIS — D3131 Benign neoplasm of right choroid: Secondary | ICD-10-CM | POA: Diagnosis not present

## 2023-06-23 DIAGNOSIS — H43822 Vitreomacular adhesion, left eye: Secondary | ICD-10-CM | POA: Diagnosis not present

## 2023-06-23 DIAGNOSIS — H2513 Age-related nuclear cataract, bilateral: Secondary | ICD-10-CM | POA: Diagnosis not present

## 2023-06-23 DIAGNOSIS — H43813 Vitreous degeneration, bilateral: Secondary | ICD-10-CM | POA: Diagnosis not present

## 2023-06-23 DIAGNOSIS — H43393 Other vitreous opacities, bilateral: Secondary | ICD-10-CM | POA: Diagnosis not present

## 2023-06-25 ENCOUNTER — Ambulatory Visit: Payer: Medicare PPO | Admitting: Internal Medicine

## 2023-06-25 ENCOUNTER — Encounter: Payer: Self-pay | Admitting: Internal Medicine

## 2023-06-25 VITALS — BP 124/80 | HR 64 | Temp 97.7°F | Ht 61.25 in | Wt 156.2 lb

## 2023-06-25 DIAGNOSIS — I10 Essential (primary) hypertension: Secondary | ICD-10-CM | POA: Diagnosis not present

## 2023-06-25 DIAGNOSIS — E785 Hyperlipidemia, unspecified: Secondary | ICD-10-CM | POA: Diagnosis not present

## 2023-06-25 DIAGNOSIS — Z0000 Encounter for general adult medical examination without abnormal findings: Secondary | ICD-10-CM

## 2023-06-25 DIAGNOSIS — E559 Vitamin D deficiency, unspecified: Secondary | ICD-10-CM | POA: Diagnosis not present

## 2023-06-25 LAB — COMPREHENSIVE METABOLIC PANEL
ALT: 18 U/L (ref 0–35)
AST: 17 U/L (ref 0–37)
Albumin: 4.7 g/dL (ref 3.5–5.2)
Alkaline Phosphatase: 46 U/L (ref 39–117)
BUN: 17 mg/dL (ref 6–23)
CO2: 29 meq/L (ref 19–32)
Calcium: 9.3 mg/dL (ref 8.4–10.5)
Chloride: 104 meq/L (ref 96–112)
Creatinine, Ser: 0.71 mg/dL (ref 0.40–1.20)
GFR: 85.19 mL/min (ref >60.00)
Glucose, Bld: 88 mg/dL (ref 70–99)
Potassium: 4 meq/L (ref 3.5–5.1)
Sodium: 139 meq/L (ref 135–145)
Total Bilirubin: 0.9 mg/dL (ref 0.2–1.2)
Total Protein: 6.7 g/dL (ref 6.0–8.3)

## 2023-06-25 LAB — CBC WITH DIFFERENTIAL/PLATELET
Basophils Absolute: 0 10*3/uL (ref 0.0–0.1)
Basophils Relative: 0.6 % (ref 0.0–3.0)
Eosinophils Absolute: 0.1 10*3/uL (ref 0.0–0.7)
Eosinophils Relative: 2 % (ref 0.0–5.0)
HCT: 41.7 % (ref 36.0–46.0)
Hemoglobin: 13.9 g/dL (ref 12.0–15.0)
Lymphocytes Relative: 22.6 % (ref 12.0–46.0)
Lymphs Abs: 1.1 10*3/uL (ref 0.7–4.0)
MCHC: 33.3 g/dL (ref 30.0–36.0)
MCV: 90.9 fL (ref 78.0–100.0)
Monocytes Absolute: 0.4 10*3/uL (ref 0.1–1.0)
Monocytes Relative: 8.3 % (ref 3.0–12.0)
Neutro Abs: 3.2 10*3/uL (ref 1.4–7.7)
Neutrophils Relative %: 66.5 % (ref 43.0–77.0)
Platelets: 203 10*3/uL (ref 150.0–400.0)
RBC: 4.59 Mil/uL (ref 3.87–5.11)
RDW: 13.2 % (ref 11.5–15.5)
WBC: 4.9 10*3/uL (ref 4.0–10.5)

## 2023-06-25 LAB — VITAMIN D 25 HYDROXY (VIT D DEFICIENCY, FRACTURES): VITD: 30.29 ng/mL (ref 30.00–100.00)

## 2023-06-25 LAB — TSH: TSH: 2.31 u[IU]/mL (ref 0.35–5.50)

## 2023-06-25 LAB — LIPID PANEL
Cholesterol: 194 mg/dL (ref 0–200)
HDL: 84.8 mg/dL (ref >39.00)
LDL Cholesterol: 95 mg/dL (ref 0–99)
NonHDL: 108.82
Total CHOL/HDL Ratio: 2
Triglycerides: 69 mg/dL (ref 0.0–149.0)
VLDL: 13.8 mg/dL (ref 0.0–40.0)

## 2023-06-25 LAB — VITAMIN B12: Vitamin B-12: 393 pg/mL (ref 211–911)

## 2023-06-25 NOTE — Progress Notes (Signed)
Established Patient Office Visit     CC/Reason for Visit: Annual preventive exam and subsequent Medicare wellness visit  HPI: Michaela [name] is a 72 y.o. female who is coming in today for the above mentioned reasons. Past Medical History is significant for: Hypertension, hyperlipidemia, vitamin D deficiency.  Feeling well without acute concerns or complaints.  Has routine eye and dental care.  All immunizations and cancer screening is up-to-date.   Past Medical/Surgical History: Past Medical History:  Diagnosis Date   Allergy    Arthritis    osteoarthritis   Foot deformity    saw podiatrist remotely   Heel spur    Hyperlipidemia    Hypertension    Migraine     Past Surgical History:  Procedure Laterality Date   no prior surgery      Social History:  reports that she quit smoking about 41 years ago. Her smoking use included cigarettes. She has never used smokeless tobacco. She reports current alcohol use of about 1.0 standard drink of alcohol per week. She reports that she does not use drugs.  Allergies: Allergies  Allergen Reactions   Sulfonamide Derivatives Hives    Family History:  Family History  Problem Relation Age of Onset   Heart disease Mother    Stroke Mother    Early death Father        accidental death - trauma   Colon cancer Paternal Uncle 25   Stomach cancer Neg Hx      Current Outpatient Medications:    amLODipine (NORVASC) 5 MG tablet, TAKE 1 TABLET BY MOUTH DAILY, Disp: 90 tablet, Rfl: 0   atorvastatin (LIPITOR) 40 MG tablet, TAKE ONE TABLET BY MOUTH EVERY EVENING AT 6 P.M, Disp: 90 tablet, Rfl: 1   cholecalciferol (VITAMIN D3) 25 MCG (1000 UNIT) tablet, Take 1,000 Units by mouth daily., Disp: , Rfl:    docusate sodium (COLACE) 100 MG capsule, Take 100 mg by mouth 2 (two) times daily., Disp: , Rfl:    fexofenadine (ALLEGRA) 180 MG tablet, Take 180 mg daily as needed by mouth for allergies or rhinitis., Disp: , Rfl:    fluticasone (FLONASE)  50 MCG/ACT nasal spray, SPRAY ONE TO TWO SPRAYS IN EACH NOSTRIL DAILY *SHAKE WELL BEFORE USE*, Disp: 16 mL, Rfl: 2   Krill Oil 1000 MG CAPS, Take by mouth., Disp: , Rfl:    losartan (COZAAR) 100 MG tablet, TAKE 1 TABLET BY MOUTH DAILY, Disp: 90 tablet, Rfl: 0   tretinoin (RETIN-A) 0.025 % cream, APPLY ONCE DAILY TO FACE AS DIRECTED, Disp: 45 g, Rfl: 3  Review of Systems:  Negative unless indicated in HPI.   Physical Exam: Vitals:   06/25/23 0900  BP: 124/80  Pulse: 64  Temp: 97.7 F (36.5 C)  TempSrc: Oral  SpO2: 100%  Weight: 156 lb 3.2 oz (70.9 kg)  Height: 5' 1.25" (1.556 m)    Body mass index is 29.27 kg/m.   Physical Exam Vitals reviewed.  Constitutional:      General: She is not in acute distress.    Appearance: Normal appearance. She is not ill-appearing, toxic-appearing or diaphoretic.  HENT:     Head: Normocephalic.     Right Ear: Tympanic membrane, ear canal and external ear normal. There is no impacted cerumen.     Left Ear: Tympanic membrane, ear canal and external ear normal. There is no impacted cerumen.     Nose: Nose normal.     Mouth/Throat:  Mouth: Mucous membranes are moist.     Pharynx: Oropharynx is clear. No oropharyngeal exudate or posterior oropharyngeal erythema.  Eyes:     General: No scleral icterus.       Right eye: No discharge.        Left eye: No discharge.     Conjunctiva/sclera: Conjunctivae normal.     Pupils: Pupils are equal, round, and reactive to light.  Neck:     Vascular: No carotid bruit.  Cardiovascular:     Rate and Rhythm: Normal rate and regular rhythm.     Pulses: Normal pulses.     Heart sounds: Normal heart sounds.  Pulmonary:     Effort: Pulmonary effort is normal. No respiratory distress.     Breath sounds: Normal breath sounds.  Abdominal:     General: Abdomen is flat. Bowel sounds are normal.     Palpations: Abdomen is soft.  Musculoskeletal:        General: Normal range of motion.     Cervical back:  Normal range of motion.  Skin:    General: Skin is warm and dry.  Neurological:     General: No focal deficit present.     Mental Status: She is alert and oriented to person, place, and time. Mental status is at baseline.  Psychiatric:        Mood and Affect: Mood normal.        Behavior: Behavior normal.        Thought Content: Thought content normal.        Judgment: Judgment normal.    Subsequent Medicare wellness visit   1. Risk factors, based on past  M,S,F - Cardiac Risk Factors include: advanced age (>3men, >54 women);hypertension   2.  Physical activities: Dietary issues and exercise activities discussed:      3.  Depression/mood:  Flowsheet Row Office Visit from 06/25/2023 in [facility] Total Score 2        4.  ADL's:    06/25/2023    8:53 AM  In your present state of health, do you have any difficulty performing the following activities:  Hearing? 0  Vision? 0  Difficulty concentrating or making decisions? 0  Walking or climbing stairs? 1  Comment stairs  Dressing or bathing? 0  Doing errands, shopping? 0  Preparing Food and eating ? N  Using the Toilet? N  In the past six months, have you accidently leaked urine? N  Do you have problems with loss of bowel control? N  Managing your Medications? N  Managing your Finances? N  Housekeeping or managing your Housekeeping? N     5.  Fall risk:     09/06/2021    9:29 AM 12/05/2021   10:04 AM 12/14/2022    5:10 PM 12/16/2022    9:30 AM 06/25/2023    9:10 AM  Fall Risk  Falls in the past year? 0 1 1 1 1   Was there an injury with Fall? 0 0 0 0 0  Fall Risk Category Calculator 0 1 2 1 1   Fall Risk Category (Retired) Low Low     (RETIRED) Patient Fall Risk Level Low fall risk Low fall risk     Patient at Risk for Falls Due to  No Fall Risks     Fall risk Follow up Falls evaluation completed Falls evaluation completed  Falls evaluation completed Falls evaluation completed      6.  Home safety: No  problems identified   7.  Height weight, and visual acuity: height and weight as above, vision/hearing: Vision Screening   Right eye Left eye Both eyes  Without correction     With correction 20/20 20/20 20/20      8.  Counseling: Counseling given: Not Answered Tobacco comments: smoked remotely for 10 years, quit in 1983    9. Lab orders based on risk factors: Laboratory update will be reviewed   10. Cognitive assessment:    09/16/2017    9:58 AM  MMSE - Mini Mental State Exam  Not completed: --        06/25/2023    8:55 AM  6CIT Screen  What Year? 0 points  What month? 0 points  What time? 0 points  Count back from 20 0 points  Months in reverse 0 points  Repeat phrase 0 points  Total Score 0 points     11. Screening: Patient provided with a written and personalized 5-10 year screening schedule in the AVS. Health Maintenance  Topic Date Due   COVID-19 Vaccine (8 - 2023-24 season) 08/01/2023   Mammogram  12/16/2023   DEXA scan (bone density measurement)  06/18/2024   Medicare Annual Wellness Visit  06/24/2024   DTaP/Tdap/Td vaccine (4 - Td or Tdap) 02/14/2026   Cologuard (Stool DNA test)  06/06/2026   Pneumonia Vaccine  Completed   Flu Shot  Completed   Hepatitis C Screening  Completed   Zoster (Shingles) Vaccine  Completed   HPV Vaccine  Aged Out   Colon Cancer Screening  Discontinued    12. Provider List Update: Patient Care Team    Relationship Specialty Notifications Start End  Philip Aspen, Limmie Patricia, MD PCP - General Internal Medicine  01/19/19   Nelson Chimes, MD Consulting Physician Ophthalmology  09/17/18    Comment: Dr. Jimmey Ralph     13. Advance Directives: Does Patient Have a Medical Advance Directive?: Yes Type of Advance Directive: Healthcare Power of Attorney, Living will, Out of facility DNR (pink MOST or yellow form) Does patient want to make changes to medical advance directive?: No - Patient declined Copy of  Healthcare Power of Attorney in Chart?: No - copy requested  14. Opioids: Patient is not on any opioid prescriptions and has no risk factors for a substance use disorder.   15.   Goals      Activity and Exercise Increased     Evidence-based guidance:  Review current exercise levels.  Assess patient perspective on exercise or activity level, barriers to increasing activity, motivation and readiness for change.  Recommend or set healthy exercise goal based on individual tolerance.  Encourage small steps toward making change in amount of exercise or activity.  Urge reduction of sedentary activities or screen time.  Promote group activities within the community or with family or support person.  Consider referral to rehabiliation therapist for assessment and exercise/activity plan.   Notes:      Exercise 150 min/wk Moderate Activity     Will continue your diet and try Tai chi      Patient Stated     Stay healthy, keep travel plans for [place]!  Increase walking daily!  Incorporate Vitamin D into med regimine for fracture prevention!          I have personally reviewed and noted the following in the patient's chart:   Medical and social history Use of alcohol, tobacco or illicit drugs  Current medications and supplements Functional ability and status Nutritional status  Physical activity Advanced directives List of other physicians Hospitalizations, surgeries, and ER visits in previous 12 months Vitals Screenings to include cognitive, depression, and falls Referrals and appointments  In addition, I have reviewed and discussed with patient certain preventive protocols, quality metrics, and best practice recommendations. A written personalized care plan for preventive services as well as general preventive health recommendations were provided to patient.   Impression and Plan:  Medicare annual wellness visit, subsequent  Hyperlipidemia, unspecified hyperlipidemia  type -     Lipid panel; Future  Essential hypertension -     CBC with Differential/Platelet; Future -     Comprehensive metabolic panel; Future -     TSH; Future -     Vitamin B12; Future  Vitamin D deficiency -     VITAMIN D 25 Hydroxy (Vit-D Deficiency, Fractures); Future   -Recommend routine eye and dental care. -Healthy lifestyle discussed in detail. -Labs to be updated today. -Prostate cancer screening: N/A Health Maintenance  Topic Date Due   COVID-19 Vaccine (8 - 2023-24 season) 08/01/2023   Mammogram  12/16/2023   DEXA scan (bone density measurement)  06/18/2024   Medicare Annual Wellness Visit  06/24/2024   DTaP/Tdap/Td vaccine (4 - Td or Tdap) 02/14/2026   Cologuard (Stool DNA test)  06/06/2026   Pneumonia Vaccine  Completed   Flu Shot  Completed   Hepatitis C Screening  Completed   Zoster (Shingles) Vaccine  Completed   HPV Vaccine  Aged Out   Colon Cancer Screening  Discontinued     -All immunizations are up-to-date. -All cancer screening is up-to-date.     Chaya Jan, MD [facility]

## 2023-07-22 ENCOUNTER — Other Ambulatory Visit: Payer: Self-pay | Admitting: Internal Medicine

## 2023-07-22 DIAGNOSIS — J3089 Other allergic rhinitis: Secondary | ICD-10-CM

## 2023-08-12 ENCOUNTER — Other Ambulatory Visit: Payer: Self-pay | Admitting: Internal Medicine

## 2023-08-12 DIAGNOSIS — I10 Essential (primary) hypertension: Secondary | ICD-10-CM

## 2023-08-21 ENCOUNTER — Other Ambulatory Visit: Payer: Self-pay | Admitting: Internal Medicine

## 2023-08-21 DIAGNOSIS — E785 Hyperlipidemia, unspecified: Secondary | ICD-10-CM

## 2023-09-07 ENCOUNTER — Other Ambulatory Visit: Payer: Self-pay | Admitting: Internal Medicine

## 2023-09-07 DIAGNOSIS — I10 Essential (primary) hypertension: Secondary | ICD-10-CM

## 2023-12-01 DIAGNOSIS — H524 Presbyopia: Secondary | ICD-10-CM | POA: Diagnosis not present

## 2023-12-01 DIAGNOSIS — H52222 Regular astigmatism, left eye: Secondary | ICD-10-CM | POA: Diagnosis not present

## 2023-12-01 DIAGNOSIS — H5203 Hypermetropia, bilateral: Secondary | ICD-10-CM | POA: Diagnosis not present

## 2023-12-22 DIAGNOSIS — Z1231 Encounter for screening mammogram for malignant neoplasm of breast: Secondary | ICD-10-CM | POA: Diagnosis not present

## 2023-12-22 LAB — HM MAMMOGRAPHY

## 2023-12-23 ENCOUNTER — Encounter: Payer: Self-pay | Admitting: Internal Medicine

## 2023-12-24 ENCOUNTER — Encounter: Payer: Self-pay | Admitting: Internal Medicine

## 2023-12-24 ENCOUNTER — Ambulatory Visit: Payer: Medicare PPO | Admitting: Internal Medicine

## 2023-12-24 VITALS — BP 128/77 | HR 70 | Temp 98.3°F | Ht 61.0 in | Wt 162.3 lb

## 2023-12-24 DIAGNOSIS — E559 Vitamin D deficiency, unspecified: Secondary | ICD-10-CM

## 2023-12-24 DIAGNOSIS — E785 Hyperlipidemia, unspecified: Secondary | ICD-10-CM

## 2023-12-24 DIAGNOSIS — I10 Essential (primary) hypertension: Secondary | ICD-10-CM | POA: Diagnosis not present

## 2023-12-24 NOTE — Progress Notes (Signed)
 Established Patient Office Visit     CC/Reason for Visit: 49-month follow-up chronic medical conditions  HPI: Michaela [name] is a 73 y.o. female who is coming in today for the above mentioned reasons. Past Medical History is significant for: Hypertension, hyperlipidemia and vitamin D  deficiency.  She is doing well without acute concerns or complaints.  She keeps meticulous blood pressure records.  Systolics in the 120s with diastolics in the 70s to 80s on average.   Past Medical/Surgical History: Past Medical History:  Diagnosis Date   Allergy    Arthritis    osteoarthritis   Foot deformity    saw podiatrist remotely   Heel spur    Hyperlipidemia    Hypertension    Migraine     Past Surgical History:  Procedure Laterality Date   no prior surgery      Social History:  reports that she quit smoking about 42 years ago. Her smoking use included cigarettes. She has never used smokeless tobacco. She reports current alcohol use of about 1.0 standard drink of alcohol per week. She reports that she does not use drugs.  Allergies: Allergies  Allergen Reactions   Sulfonamide Derivatives Hives    Family History:  Family History  Problem Relation Age of Onset   Heart disease Mother    Stroke Mother    Early death Father        accidental death - trauma   Colon cancer Paternal Uncle 80   Stomach cancer Neg Hx      Current Outpatient Medications:    amLODipine  (NORVASC ) 5 MG tablet, TAKE 1 TABLET BY MOUTH DAILY, Disp: 90 tablet, Rfl: 1   atorvastatin  (LIPITOR) 40 MG tablet, TAKE 1 TABLET BY MOUTH DAILY AT 6PM, Disp: 90 tablet, Rfl: 1   cholecalciferol (VITAMIN D3) 25 MCG (1000 UNIT) tablet, Take 1,000 Units by mouth daily., Disp: , Rfl:    docusate sodium (COLACE) 100 MG capsule, Take 100 mg by mouth 2 (two) times daily., Disp: , Rfl:    fexofenadine (ALLEGRA) 180 MG tablet, Take 180 mg daily as needed by mouth for allergies or rhinitis., Disp: , Rfl:    fluticasone   (FLONASE ) 50 MCG/ACT nasal spray, SPRAY ONE TO TWO  SPRAYS IN EACH NOSTRIL ONCE DAILY *SHAKE WELL BEFORE EACH USE, Disp: 16 mL, Rfl: 2   Krill Oil 1000 MG CAPS, Take by mouth., Disp: , Rfl:    losartan  (COZAAR ) 100 MG tablet, TAKE 1 TABLET BY MOUTH DAILY, Disp: 90 tablet, Rfl: 1   tretinoin  (RETIN-A ) 0.025 % cream, APPLY ONCE DAILY TO FACE AS DIRECTED, Disp: 45 g, Rfl: 3  Review of Systems:  Negative unless indicated in HPI.   Physical Exam: Vitals:   12/24/23 0849 12/24/23 0851 12/24/23 0906  BP: (!) 142/80 137/74 128/77  Pulse:  70   Temp:  98.3 F (36.8 C)   TempSrc:  Oral   SpO2:  95%   Weight:  162 lb 4.8 oz (73.6 kg)   Height:  5\' 1"  (1.549 m)     Body mass index is 30.67 kg/m.   Physical Exam Vitals reviewed.  Constitutional:      Appearance: Normal appearance.  HENT:     Head: Normocephalic and atraumatic.  Eyes:     Conjunctiva/sclera: Conjunctivae normal.  Cardiovascular:     Rate and Rhythm: Normal rate and regular rhythm.  Pulmonary:     Effort: Pulmonary effort is normal.     Breath sounds: Normal breath  sounds.  Skin:    General: Skin is warm and dry.  Neurological:     General: No focal deficit present.     Mental Status: She is alert and oriented to person, place, and time.  Psychiatric:        Mood and Affect: Mood normal.        Behavior: Behavior normal.        Thought Content: Thought content normal.        Judgment: Judgment normal.      Impression and Plan:  Essential hypertension  Hyperlipidemia, unspecified hyperlipidemia type  Vitamin D  deficiency   - Blood pressure is well-controlled on current. - LDL is at goal on atorvastatin  40 mg. - She continues on vitamin D  supplementation.  Time spent:30 minutes reviewing chart, interviewing and examining patient and formulating plan of care.     Marguerita Shih, MD [facility]

## 2024-02-05 ENCOUNTER — Other Ambulatory Visit: Payer: Self-pay | Admitting: Internal Medicine

## 2024-02-05 DIAGNOSIS — I10 Essential (primary) hypertension: Secondary | ICD-10-CM

## 2024-02-22 ENCOUNTER — Other Ambulatory Visit: Payer: Self-pay | Admitting: Internal Medicine

## 2024-02-22 DIAGNOSIS — E785 Hyperlipidemia, unspecified: Secondary | ICD-10-CM

## 2024-02-27 DIAGNOSIS — L820 Inflamed seborrheic keratosis: Secondary | ICD-10-CM | POA: Diagnosis not present

## 2024-02-27 DIAGNOSIS — L538 Other specified erythematous conditions: Secondary | ICD-10-CM | POA: Diagnosis not present

## 2024-02-27 DIAGNOSIS — C44319 Basal cell carcinoma of skin of other parts of face: Secondary | ICD-10-CM | POA: Diagnosis not present

## 2024-02-27 DIAGNOSIS — L814 Other melanin hyperpigmentation: Secondary | ICD-10-CM | POA: Diagnosis not present

## 2024-02-27 DIAGNOSIS — L608 Other nail disorders: Secondary | ICD-10-CM | POA: Diagnosis not present

## 2024-02-27 DIAGNOSIS — R208 Other disturbances of skin sensation: Secondary | ICD-10-CM | POA: Diagnosis not present

## 2024-02-27 DIAGNOSIS — D485 Neoplasm of uncertain behavior of skin: Secondary | ICD-10-CM | POA: Diagnosis not present

## 2024-02-27 DIAGNOSIS — L821 Other seborrheic keratosis: Secondary | ICD-10-CM | POA: Diagnosis not present

## 2024-02-27 DIAGNOSIS — D225 Melanocytic nevi of trunk: Secondary | ICD-10-CM | POA: Diagnosis not present

## 2024-03-03 ENCOUNTER — Other Ambulatory Visit: Payer: Self-pay | Admitting: Internal Medicine

## 2024-03-03 DIAGNOSIS — I10 Essential (primary) hypertension: Secondary | ICD-10-CM

## 2024-03-30 ENCOUNTER — Ambulatory Visit: Admitting: Internal Medicine

## 2024-03-30 ENCOUNTER — Ambulatory Visit
Admission: RE | Admit: 2024-03-30 | Discharge: 2024-03-30 | Disposition: A | Discharge status: Home / Self Care | Source: Ambulatory Visit | Attending: Internal Medicine | Admitting: Internal Medicine

## 2024-03-30 ENCOUNTER — Encounter: Payer: Self-pay | Admitting: Internal Medicine

## 2024-03-30 VITALS — BP 130/86 | HR 76 | Temp 98.3°F | Wt 159.6 lb

## 2024-03-30 DIAGNOSIS — R052 Subacute cough: Secondary | ICD-10-CM | POA: Diagnosis not present

## 2024-03-30 DIAGNOSIS — R059 Cough, unspecified: Secondary | ICD-10-CM | POA: Diagnosis not present

## 2024-03-30 DIAGNOSIS — R0602 Shortness of breath: Secondary | ICD-10-CM | POA: Diagnosis not present

## 2024-03-30 DIAGNOSIS — R0609 Other forms of dyspnea: Secondary | ICD-10-CM | POA: Diagnosis not present

## 2024-03-30 DIAGNOSIS — J9811 Atelectasis: Secondary | ICD-10-CM | POA: Diagnosis not present

## 2024-03-30 MED ORDER — ALBUTEROL SULFATE HFA 108 (90 BASE) MCG/ACT IN AERS
2.0000 | INHALATION_SPRAY | Freq: Four times a day (QID) | RESPIRATORY_TRACT | 2 refills | Status: AC | PRN
Start: 2024-03-30 — End: ?

## 2024-03-30 MED ORDER — BENZONATATE 100 MG PO CAPS: 100.0000 mg | ORAL_CAPSULE | Freq: Two times a day (BID) | ORAL | 0 refills | Status: AC | PRN

## 2024-03-30 NOTE — Progress Notes (Signed)
 Established Patient Office Visit     CC/Reason for Visit: Cough  HPI: Michaela [name] is a 73 y.o. female who is coming in today for the above mentioned reasons.  Has been experiencing a cough for the past 3 weeks.  It is nonproductive.  She typically suffers from seasonal allergies but has been on Allegra and Flonase .  She has felt a little fatigued as well.  No other URI symptoms.  Feels a little short of breath especially after a coughing spasm.  Is a very remote smoker.  No chest pain, no lower extremity edema.   Past Medical/Surgical History: Past Medical History:  Diagnosis Date   Allergy    Arthritis    osteoarthritis   Foot deformity    saw podiatrist remotely   Heel spur    Hyperlipidemia    Hypertension    Migraine     Past Surgical History:  Procedure Laterality Date   no prior surgery      Social History:  reports that she quit smoking about 42 years ago. Her smoking use included cigarettes. She has never used smokeless tobacco. She reports current alcohol use of about 1.0 standard drink of alcohol per week. She reports that she does not use drugs.  Allergies: Allergies  Allergen Reactions   Sulfonamide Derivatives Hives    Family History:  Family History  Problem Relation Age of Onset   Heart disease Mother    Stroke Mother    Early death Father        accidental death - trauma   Colon cancer Paternal Uncle 41   Stomach cancer Neg Hx      Current Outpatient Medications:    albuterol  (VENTOLIN  HFA) 108 (90 Base) MCG/ACT inhaler, Inhale 2 puffs into the lungs every 6 (six) hours as needed for wheezing or shortness of breath., Disp: 8 g, Rfl: 2   benzonatate  (TESSALON ) 100 MG capsule, Take 1 capsule (100 mg total) by mouth 2 (two) times daily as needed for cough., Disp: 20 capsule, Rfl: 0   amLODipine  (NORVASC ) 5 MG tablet, TAKE 1 TABLET BY MOUTH DAILY, Disp: 90 tablet, Rfl: 1   atorvastatin  (LIPITOR) 40 MG tablet, TAKE 1 TABLET BY MOUTH DAILY AT 6  P.M., Disp: 90 tablet, Rfl: 1   cholecalciferol (VITAMIN D3) 25 MCG (1000 UNIT) tablet, Take 1,000 Units by mouth daily., Disp: , Rfl:    docusate sodium (COLACE) 100 MG capsule, Take 100 mg by mouth 2 (two) times daily., Disp: , Rfl:    fexofenadine (ALLEGRA) 180 MG tablet, Take 180 mg daily as needed by mouth for allergies or rhinitis., Disp: , Rfl:    fluticasone  (FLONASE ) 50 MCG/ACT nasal spray, SPRAY ONE TO TWO  SPRAYS IN EACH NOSTRIL ONCE DAILY *SHAKE WELL BEFORE EACH USE, Disp: 16 mL, Rfl: 2   Krill Oil 1000 MG CAPS, Take by mouth., Disp: , Rfl:    losartan  (COZAAR ) 100 MG tablet, TAKE 1 TABLET BY MOUTH DAILY, Disp: 90 tablet, Rfl: 1   tretinoin  (RETIN-A ) 0.025 % cream, APPLY ONCE DAILY TO FACE AS DIRECTED, Disp: 45 g, Rfl: 3  Review of Systems:  Negative unless indicated in HPI.   Physical Exam: Vitals:   03/30/24 1518  BP: 130/86  Pulse: 76  Temp: 98.3 F (36.8 C)  TempSrc: Oral  SpO2: 99%  Weight: 159 lb 9.6 oz (72.4 kg)    Body mass index is 30.16 kg/m.   Physical Exam Vitals reviewed.  Constitutional:  Appearance: Normal appearance.  HENT:     Head: Normocephalic and atraumatic.  Eyes:     Conjunctiva/sclera: Conjunctivae normal.  Cardiovascular:     Rate and Rhythm: Normal rate and regular rhythm.  Pulmonary:     Effort: Pulmonary effort is normal.     Breath sounds: Examination of the right-upper field reveals rhonchi. Examination of the right-middle field reveals rhonchi. Rhonchi present.  Skin:    General: Skin is warm and dry.  Neurological:     General: No focal deficit present.     Mental Status: She is alert and oriented to person, place, and time.  Psychiatric:        Mood and Affect: Mood normal.        Behavior: Behavior normal.        Thought Content: Thought content normal.        Judgment: Judgment normal.      Impression and Plan:  Subacute cough -     DG Chest 2 View; Future -     Benzonatate ; Take 1 capsule (100 mg total) by  mouth 2 (two) times daily as needed for cough.  Dispense: 20 capsule; Refill: 0 -     Albuterol  Sulfate HFA; Inhale 2 puffs into the lungs every 6 (six) hours as needed for wheezing or shortness of breath.  Dispense: 8 g; Refill: 2  DOE (dyspnea on exertion)   - With subacute cough, dyspnea on exertion and rhonchi heard at the right upper and middle lung fields, I wonder about possibility of pneumonia.  Will send for chest x-ray.  Will prescribe Tessalon  Perles and an inhaler.  Time spent:30 minutes reviewing chart, interviewing and examining patient and formulating plan of care.     Tully Theophilus Andrews, MD [facility]

## 2024-04-01 ENCOUNTER — Encounter: Payer: Self-pay | Admitting: Internal Medicine

## 2024-04-02 ENCOUNTER — Encounter: Payer: Self-pay | Admitting: Internal Medicine

## 2024-04-07 ENCOUNTER — Ambulatory Visit: Payer: Self-pay | Admitting: Internal Medicine

## 2024-04-13 DIAGNOSIS — C44319 Basal cell carcinoma of skin of other parts of face: Secondary | ICD-10-CM | POA: Diagnosis not present

## 2024-04-23 ENCOUNTER — Other Ambulatory Visit: Payer: Self-pay | Admitting: Internal Medicine

## 2024-04-23 DIAGNOSIS — J3089 Other allergic rhinitis: Secondary | ICD-10-CM

## 2024-04-28 ENCOUNTER — Encounter: Payer: Self-pay | Admitting: Internal Medicine

## 2024-06-10 ENCOUNTER — Ambulatory Visit: Admitting: Family Medicine

## 2024-06-10 ENCOUNTER — Telehealth: Payer: Self-pay | Admitting: Internal Medicine

## 2024-06-10 VITALS — BP 134/62

## 2024-06-10 DIAGNOSIS — Z0000 Encounter for general adult medical examination without abnormal findings: Secondary | ICD-10-CM | POA: Diagnosis not present

## 2024-06-10 NOTE — Patient Instructions (Signed)
 I really enjoyed getting to talk with you today! I am available on Tuesdays and Thursdays for virtual visits if you have any questions or concerns, or if I can be of any further assistance.   CHECKLIST FROM ANNUAL WELLNESS VISIT:  -Follow up (please call to schedule if not scheduled after visit):   -schedule in office follow up regarding the cough - call today   -yearly for annual wellness visit with primary care office  Here is a list of your preventive care/health maintenance measures and the plan for each if any are due:  PLAN For any measures below that may be due:   Can let Dr. Theophilus know ig you decide to do the bone density test  Health Maintenance  Topic Date Due   Medicare Annual Wellness (AWV)  06/24/2024   DEXA SCAN  06/18/2024   COVID-19 Vaccine (9 - 2025-26 season) 06/25/2024   Mammogram  12/21/2024   DTaP/Tdap/Td (4 - Td or Tdap) 02/14/2026   Fecal DNA (Cologuard)  06/06/2026   Pneumococcal Vaccine: 50+ Years  Completed   Influenza Vaccine  Completed   Hepatitis C Screening  Completed   Zoster Vaccines- Shingrix  Completed   Meningococcal B Vaccine  Aged Out   Colonoscopy  Discontinued    -See a dentist at least yearly  -Get your eyes checked and then per your eye specialist's recommendations  -Other issues addressed today:   -I have included below further information regarding a healthy whole foods based diet, physical activity guidelines for adults, stress management and opportunities for social connections. I hope you find this information useful.   -----------------------------------------------------------------------------------------------------------------------------------------------------------------------------------------------------------------------------------------------------------    NUTRITION: -eat real food: lots of colorful vegetables (half the plate) and fruits -5-7 servings of vegetables and fruits per day (fresh or steamed is  best), exp. 2 servings of vegetables with lunch and dinner and 2 servings of fruit per day. Berries and greens such as kale and collards are great choices.  -consume on a regular basis:  fresh fruits, fresh veggies, fish, nuts, seeds, healthy oils (such as olive oil, avocado oil), whole grains (make sure for bread/pasta/crackers/etc., that the first ingredient on label contains the word whole), legumes. -can eat small amounts of dairy and lean meat (no larger than the palm of your hand), but avoid processed meats such as ham, bacon, lunch meat, etc. -drink water -try to avoid fast food and pre-packaged foods, processed meat, ultra processed foods/beverages (donuts, candy, etc.) -most experts advise limiting sodium to < 2300mg  per day, should limit further is any chronic conditions such as high blood pressure, heart disease, diabetes, etc. The American Heart Association advised that < 1500mg  is is ideal -try to avoid foods/beverages that contain any ingredients with names you do not recognize  -try to avoid foods/beverages  with added sugar or sweeteners/sweets  -try to avoid sweet drinks (including diet drinks): soda, juice, Gatorade, sweet tea, power drinks, diet drinks -try to avoid white rice, white bread, pasta (unless whole grain)  EXERCISE GUIDELINES FOR ADULTS: -if you wish to increase your physical activity, do so gradually and with the approval of your doctor -STOP and seek medical care immediately if you have any chest pain, chest discomfort or trouble breathing when starting or increasing exercise  -move and stretch your body, legs, feet and arms when sitting for long periods -Physical activity guidelines for optimal health in adults: -get at least 150 minutes per week of moderate exercise (can talk, but not sing); this is about 20-30 minutes  of sustained activity 5-7 days per week or two 10-15 minute episodes of sustained activity 5-7 days per week -do some muscle building/resistance  training/strength training at least 2 days per week  -balance exercises 3+ days per week:   Stand somewhere where you have something sturdy to hold onto if you lose balance    1) lift up on toes, then back down, start with 5x per day and work up to 20x   2) stand and lift one leg straight out to the side so that foot is a few inches of the floor, start with 5x each side and work up to 20x each side   3) stand on one foot, start with 5 seconds each side and work up to 20 seconds on each side  If you need ideas or help with getting more active:  -Silver sneakers https://tools.silversneakers.com  -Walk with a Doc: Http://www.duncan-williams.com/  -try to include resistance (weight lifting/strength building) and balance exercises twice per week: or the following link for ideas: http://castillo-powell.com/  buyducts.dk  STRESS MANAGEMENT: -can try meditating, or just sitting quietly with deep breathing while intentionally relaxing all parts of your body for 5 minutes daily -if you need further help with stress, anxiety or depression please follow up with your primary doctor or contact the wonderful folks at [facility]: [phone]  SOCIAL CONNECTIONS: -options in [place] if you wish to engage in more social and exercise related activities:  -Silver sneakers https://tools.silversneakers.com  -Walk with a Doc: Http://www.duncan-williams.com/  -Check out the [facility] Active Adults 50+ section on the [place] [employer] companies (hiking clubs, book clubs, cards and games, chess, exercise classes, aquatic classes and much more) - see the website for details: https://www.[place].gov/departments/parks-recreation/active-adults50  -YouTube has lots of exercise videos for different ages and abilities as well  -[facility] (a variety of indoor and outdoor inperson activities for  adults). ([phone]. [address]  -Virtual Online Classes (a variety of topics): see seniorplanet.org or call [phone]  -consider volunteering at a school, hospice center, church, senior center or elsewhere

## 2024-06-10 NOTE — Telephone Encounter (Signed)
 Lmom pt needs to sch an appt for cough per dr luke

## 2024-06-10 NOTE — Progress Notes (Signed)
 Question 03/29/2024  1:12 PM EST - Filed by Patient 12/23/2023 11:38 AM EST - Filed by Patient  Within the past 12 months, have you ever stayed: outside, in a car, in a tent, in an overnight shelter, or temporarily in someone else's home (i.e. couch-surfing)? No No  Do you have problems with pests (bugs, ants, mice), mold, lead and/or water leaks at the place where you stay? No No  On average, how many days per week do you engage in moderate to strenuous exercise (like a brisk walk)? 4 days 4 days  On average, how many minutes do you engage in exercise at this level? 30 min 30 min  Do you feel stress - tense, restless, nervous, or anxious, or unable to sleep at night because your mind is troubled all the time - these days? Only a little Only a little  Do you belong to any clubs or organizations such as church groups, unions, fraternal or athletic groups, or school groups? Yes Yes  How often do you attend meetings of the clubs or organizations you belong to? More than 4 times per year More than 4 times per year  In a typical week, how many times do you talk on the phone with family, friends, or neighbors? Three times a week Three times a week  How often do you get together with friends or relatives? Once a week More than three times a week  How often do you attend church or religious services? Never Never  Are you married, widowed, divorced, separated, never married, or living with a partner? Married Married  How hard is it for you to pay for the very basics like food, housing, medical care, and heating? Not hard at all Not hard at all  Within the past 12 months, you worried that your food would run out before you got the money to buy more. Never true Never true  Within the past 12 months, the food you bought just didn't last and you didn't have money to get more. Never true Never true  What is the highest level of school you have completed or the highest degree you have received? Master's degree  (e.g., MA, MS, MEng, MEd, MSW, MBA) Master's degree (e.g., MA, MS, MEng, MEd, MSW, MBA)  In the past 12 months, has lack of transportation kept you from medical appointments or from getting medications? No No  In the past 12 months, has lack of transportation kept you from meetings, work, or from getting things needed for daily living? No No  Alcohol Screening Tool    How often do you have a drink containing alcohol? 2 to 3 times a week 4 or more times a week  How many drinks containing alcohol do you have on a typical day when you are drinking? 1 or 2 1 or 2  How often do you have six or more drinks on one occasion? Never Never  In the last 12 months, was there a time when you were not able to pay the mortgage or rent on time? No No  In the past 12 months, how many times have you moved where you were living? 0 0  At any time in the past 12 months, were you homeless or living in a shelter (including now)? No No  Alcohol Screening Score (range: 0 - 12) 3 4  How often during the last year have you found that you were not able to stop drinking once you had started?  Never Never  How often during the last year have you failed to do what was normally expected from you because of drinking? Never Never  How often during the last year have you needed a first drink in the morning to get yourself going after a heavy drinking session? Never Never  How often during the last year have you had a feeling of guilt of remorse after drinking? Never Never  How often during the last year have you been unable to remember what happened the night before because you had been drinking? Never Never  Have you or someone else been injured as a result of your drinking? No No  Has a relative or friend or a doctor or another health worker been concerned about your drinking or suggested you cut down?      ----------------------------------------------------------------------------------------------------------------------------------------------------------------------------------------------------------------------  Because this visit was a virtual/telehealth visit, some criteria may be missing or patient reported. Any vitals not documented were not able to be obtained and vitals that have been documented are patient reported.    MEDICARE ANNUAL PREVENTIVE VISIT WITH PROVIDER: (Welcome to Medicare, initial annual wellness or annual wellness exam)  Virtual Visit via Video Note  I connected with Michaela [name] on 06/10/24 by a video enabled telemedicine application and verified that I am speaking with the correct person using two identifiers.  Location patient: home Location provider:work or home office Persons participating in the virtual visit: patient, provider  Concerns and/or follow up today: had cough recently and saw PCP for this and has follow up in a few weeks - some days better and some has a cough again and occ mild SOB. Reports this is stable. Had several falls this year - in moving process and not paying attention. No injury. She feels balance is ok. Detailed intake and risks/health assessment completed in flowsheets and below - please see for details.   How often do you have a drink containing alcohol?usually daily How many drinks containing alcohol do you have on a typical day when you are drinking?1 How often do you have six or more drinks on one occasion?never Have you ever smoked?y Quit date if applicable? Quit in 83  How many packs a day do/did you smoke? 1.5ppd Do you use smokeless tobacco?n Do you use an illicit drugs?n Do you feel safe at home?n Last dentist visit?goes on a regular basis Last eye Exam and location? Dr. Camillo - in the summer   See HM section in Epic for other details of completed HM.    ROS: negative for report of fevers, vision changes, vision  loss, hearing loss or change, chest pain, hemoptysis, melena, hematochezia, hematuria, falls, bleeding or bruising, thoughts of suicide or self harm, memory loss  Patient-completed extensive health risk assessment - reviewed and discussed with the patient: See Health Risk Assessment completed with patient prior to the visit either above or in recent phone note. This was reviewed in detailed with the patient today and appropriate recommendations, orders and referrals were placed as needed per Summary below and patient instructions.   Review of Medical History: -PMH, PSH, Family History and current specialty and care providers reviewed and updated and listed below   Patient Care Team: Theophilus Andrews, Tully GRADE, MD as PCP - General (Internal Medicine) Camillo Golas, MD as Consulting Physician (Ophthalmology)   Past Medical History:  Diagnosis Date   Allergy    Arthritis    osteoarthritis   Foot deformity    saw podiatrist remotely   Heel  spur    Hyperlipidemia    Hypertension    Migraine     Past Surgical History:  Procedure Laterality Date   no prior surgery      Social History   Socioeconomic History   Marital status: Married    Spouse name: Not on file   Number of children: 0   Years of education: Not on file   Highest education level: Master's degree (e.g., MA, MS, MEng, MEd, MSW, MBA)  Occupational History   Occupation: Business, alternative school    Comment: retired  Tobacco Use   Smoking status: Former    Current packs/day: 0.00    Types: Cigarettes    Quit date: 10/03/1981    Years since quitting: 42.7   Smokeless tobacco: Never   Tobacco comments:    smoked remotely for 10 years, quit in 1983  Substance and Sexual Activity   Alcohol use: Yes    Alcohol/week: 1.0 standard drink of alcohol    Types: 1 Glasses of wine per week   Drug use: No   Sexual activity: Yes  Other Topics Concern   Not on file  Social History Narrative   Work or School: runner, 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[employer], business and computer classes      Home Situation: lives with husband      Spiritual Beliefs: none      Lifestyle: 3 times per week (walks 1.5 miles and then does weight); diet healthy      09/17/18: Lives with husband in ranch home, 2 cats   Local friends good support system   Enjoys reading, travelling, doing presenter, broadcasting         Social Drivers of Health   Financial Resource Strain: Low Risk  (03/29/2024)   Overall Financial Resource Strain (CARDIA)    Difficulty of Paying Living Expenses: Not hard at all  Food Insecurity: No Food Insecurity (03/29/2024)   Hunger Vital Sign    Worried About Running Out of Food in the Last Year: Never true    Ran Out of Food in the Last Year: Never true  Transportation Needs: No Transportation Needs (03/29/2024)   PRAPARE - Administrator, Civil Service (Medical): No    Lack of Transportation (Non-Medical): No  Physical Activity: Unknown (06/10/2024)   Exercise Vital Sign    Days of Exercise per Week: 2 days    Minutes of Exercise per Session: Not on file  Recent Concern: Physical Activity - Insufficiently Active (03/29/2024)   Exercise Vital Sign    Days of Exercise per Week: 4 days    Minutes of Exercise per Session: 30 min  Stress: No Stress Concern Present (06/10/2024)   [employer] of Occupational Health - Occupational Stress Questionnaire    Feeling of Stress: Only a little  Social Connections: Moderately Integrated (06/10/2024)   Social Connection and Isolation Panel    Frequency of Communication with Friends and Family: More than three times a week    Frequency of Social Gatherings with Friends and Family: Twice a week    Attends Religious Services: Never    Database Administrator or Organizations: Yes    Attends Engineer, Structural: More than 4 times per year    Marital Status: Married  Catering Manager Violence: Not At Risk (06/25/2023)   Humiliation, Afraid, Rape, and  Kick questionnaire    Fear of Current or Ex-Partner: No    Emotionally Abused: No    Physically Abused: No  Sexually Abused: No    Family History  Problem Relation Age of Onset   Heart disease Mother    Stroke Mother    Early death Father        accidental death - trauma   Colon cancer Paternal Uncle 64   Stomach cancer Neg Hx     Current Outpatient Medications on File Prior to Visit  Medication Sig Dispense Refill   albuterol  (VENTOLIN  HFA) 108 (90 Base) MCG/ACT inhaler Inhale 2 puffs into the lungs every 6 (six) hours as needed for wheezing or shortness of breath. 8 g 2   amLODipine  (NORVASC ) 5 MG tablet TAKE 1 TABLET BY MOUTH DAILY 90 tablet 1   atorvastatin  (LIPITOR) 40 MG tablet TAKE 1 TABLET BY MOUTH DAILY AT 6 P.M. 90 tablet 1   benzonatate  (TESSALON ) 100 MG capsule Take 1 capsule (100 mg total) by mouth 2 (two) times daily as needed for cough. 20 capsule 0   cholecalciferol (VITAMIN D3) 25 MCG (1000 UNIT) tablet Take 1,000 Units by mouth daily.     docusate sodium (COLACE) 100 MG capsule Take 100 mg by mouth 2 (two) times daily.     fexofenadine (ALLEGRA) 180 MG tablet Take 180 mg daily as needed by mouth for allergies or rhinitis.     fluticasone  (FLONASE ) 50 MCG/ACT nasal spray USE 1 OR 2 SPRAYS INTO EACH NOSTRIL ONCE DAILY *SHAKE WELL BEFORE EACH USE* 16 mL 2   Krill Oil 1000 MG CAPS Take by mouth.     losartan  (COZAAR ) 100 MG tablet TAKE 1 TABLET BY MOUTH DAILY 90 tablet 1   tretinoin  (RETIN-A ) 0.025 % cream APPLY ONCE DAILY TO FACE AS DIRECTED 45 g 3   No current facility-administered medications on file prior to visit.    Allergies  Allergen Reactions   Sulfonamide Derivatives Hives       Physical Exam Vitals requested from patient and listed below if patient had equipment and was able to obtain at home for this virtual visit: Vitals:   06/10/24 1539  BP: 134/62   Estimated body mass index is 30.16 kg/m as calculated from the following:   Height as of  12/24/23: 5' 1 (1.549 m).   Weight as of 03/30/24: 159 lb 9.6 oz (72.4 kg).  EKG (optional): deferred due to virtual visit  GENERAL: alert, oriented, no acute distress detected, full vision exam deferred due to pandemic and/or virtual encounter  HEENT: atraumatic, conjunttiva clear, no obvious abnormalities on inspection of external nose and ears  NECK: normal movements of the head and neck  LUNGS: on inspection no signs of respiratory distress, breathing rate appears normal, no obvious gross SOB, gasping or wheezing  CV: no obvious cyanosis  MS: moves all visible extremities without noticeable abnormality  PSYCH/NEURO: pleasant and cooperative, no obvious depression or anxiety, speech and thought processing grossly intact, Cognitive function grossly intact  Flowsheet Row Office Visit from 06/25/2023 in [facility]  PHQ-9 Total Score 2        06/10/2024    3:55 PM 06/25/2023    9:10 AM 12/16/2022    9:30 AM 12/05/2021   10:03 AM 09/06/2021    9:29 AM  Depression screen PHQ 2/9  Decreased Interest 0 0 0 0 0  Down, Depressed, Hopeless 1 0 0 0 0  PHQ - 2 Score 1 0 0 0 0  Altered sleeping  0 0 0 0  Tired, decreased energy  1 0 0 0  Change  in appetite  1 0 0 0  Feeling bad or failure about yourself   0 0 0 0  Trouble concentrating  0 0 1 0  Moving slowly or fidgety/restless  0 0 0 0  Suicidal thoughts  0 0 0 0  PHQ-9 Score  2  0  1  0   Difficult doing work/chores   Not difficult at all Not difficult at all Not difficult at all     Data saved with a previous flowsheet row definition       12/14/2022    5:10 PM 12/16/2022    9:30 AM 06/25/2023    9:10 AM 12/23/2023   11:38 AM 06/10/2024    3:42 PM  Fall Risk  Falls in the past year? 1 1 1 1 1   Was there an injury with Fall? 0 0 0 0 0  Fall Risk Category Calculator 2 1 1 1  2   Patient at Risk for Falls Due to     History of fall(s)  Fall risk Follow up  Falls evaluation completed Falls  evaluation completed  Falls evaluation completed     Patient-reported     SUMMARY AND PLAN:  Medicare annual wellness visit, subsequent  Discussed applicable health maintenance/preventive health measures and advised and referred or ordered per patient preferences: -reports she already had her flu and covid vaccines at [facility] on  -discussed bone density, she plans to discuss with Dr. Theophilus at upcoming visit  Health Maintenance  Topic Date Due   DEXA SCAN  06/18/2024   COVID-19 Vaccine (9 - 2025-26 season) 06/25/2024   Mammogram  12/21/2024   Medicare Annual Wellness (AWV)  06/10/2025   DTaP/Tdap/Td (4 - Td or Tdap) 02/14/2026   Fecal DNA (Cologuard)  06/06/2026   Pneumococcal Vaccine: 50+ Years  Completed   Influenza Vaccine  Completed   Hepatitis C Screening  Completed   Zoster Vaccines- Shingrix  Completed   Meningococcal B Vaccine  Aged Out   Colonoscopy  Discontinued      Education and counseling on the following was provided based on the above review of health and a plan/checklist for the patient, along with additional information discussed, was provided for the patient in the patient instructions :   -Provided counseling and plan for increased risk of falling if applicable per above screening. Reviewed and demonstrated safe balance exercises that can be done at home to improve balance and discussed exercise guidelines for adults with include balance exercises at least 3 days per week.  -Advised and counseled on a healthy lifestyle - including the importance of a healthy diet, regular physical activity, social connections and stress management. -Reviewed patient's current diet. Advised and counseled on a whole foods based healthy diet. A summary of a healthy diet was provided in the Patient Instructions.  -reviewed patient's current physical activity level and discussed exercise guidelines for adults. Discussed community resources and ideas for safe exercise at  home to assist in meeting exercise guideline recommendations in a safe and healthy way.  -Advise yearly dental visits at minimum and regular eye exams -Advised and counseled on alcohol safe limits, risks -advised follow up in office regarding the cough/resp issues - sent message to schedulers to assist and advised pt to call office  Follow up: see patient instructions     Patient Instructions  I really enjoyed getting to talk with you today! I am available on Tuesdays and Thursdays for virtual visits if you have any questions or concerns,  or if I can be of any further assistance.   CHECKLIST FROM ANNUAL WELLNESS VISIT:  -Follow up (please call to schedule if not scheduled after visit):   -schedule in office follow up regarding the cough - call today   -yearly for annual wellness visit with primary care office  Here is a list of your preventive care/health maintenance measures and the plan for each if any are due:  PLAN For any measures below that may be due:   Can let Dr. Theophilus know ig you decide to do the bone density test  Health Maintenance  Topic Date Due   Medicare Annual Wellness (AWV)  06/24/2024   DEXA SCAN  06/18/2024   COVID-19 Vaccine (9 - 2025-26 season) 06/25/2024   Mammogram  12/21/2024   DTaP/Tdap/Td (4 - Td or Tdap) 02/14/2026   Fecal DNA (Cologuard)  06/06/2026   Pneumococcal Vaccine: 50+ Years  Completed   Influenza Vaccine  Completed   Hepatitis C Screening  Completed   Zoster Vaccines- Shingrix  Completed   Meningococcal B Vaccine  Aged Out   Colonoscopy  Discontinued    -See a dentist at least yearly  -Get your eyes checked and then per your eye specialist's recommendations  -Other issues addressed today:   -I have included below further information regarding a healthy whole foods based diet, physical activity guidelines for adults, stress management and opportunities for social connections. I hope you find this information useful.    -----------------------------------------------------------------------------------------------------------------------------------------------------------------------------------------------------------------------------------------------------------    NUTRITION: -eat real food: lots of colorful vegetables (half the plate) and fruits -5-7 servings of vegetables and fruits per day (fresh or steamed is best), exp. 2 servings of vegetables with lunch and dinner and 2 servings of fruit per day. Berries and greens such as kale and collards are great choices.  -consume on a regular basis:  fresh fruits, fresh veggies, fish, nuts, seeds, healthy oils (such as olive oil, avocado oil), whole grains (make sure for bread/pasta/crackers/etc., that the first ingredient on label contains the word whole), legumes. -can eat small amounts of dairy and lean meat (no larger than the palm of your hand), but avoid processed meats such as ham, bacon, lunch meat, etc. -drink water -try to avoid fast food and pre-packaged foods, processed meat, ultra processed foods/beverages (donuts, candy, etc.) -most experts advise limiting sodium to < 2300mg  per day, should limit further is any chronic conditions such as high blood pressure, heart disease, diabetes, etc. The American Heart Association advised that < 1500mg  is is ideal -try to avoid foods/beverages that contain any ingredients with names you do not recognize  -try to avoid foods/beverages  with added sugar or sweeteners/sweets  -try to avoid sweet drinks (including diet drinks): soda, juice, Gatorade, sweet tea, power drinks, diet drinks -try to avoid white rice, white bread, pasta (unless whole grain)  EXERCISE GUIDELINES FOR ADULTS: -if you wish to increase your physical activity, do so gradually and with the approval of your doctor -STOP and seek medical care immediately if you have any chest pain, chest discomfort or trouble breathing when starting or  increasing exercise  -move and stretch your body, legs, feet and arms when sitting for long periods -Physical activity guidelines for optimal health in adults: -get at least 150 minutes per week of moderate exercise (can talk, but not sing); this is about 20-30 minutes of sustained activity 5-7 days per week or two 10-15 minute episodes of sustained activity 5-7 days per week -do some muscle building/resistance training/strength training  at least 2 days per week  -balance exercises 3+ days per week:   Stand somewhere where you have something sturdy to hold onto if you lose balance    1) lift up on toes, then back down, start with 5x per day and work up to 20x   2) stand and lift one leg straight out to the side so that foot is a few inches of the floor, start with 5x each side and work up to 20x each side   3) stand on one foot, start with 5 seconds each side and work up to 20 seconds on each side  If you need ideas or help with getting more active:  -Silver sneakers https://tools.silversneakers.com  -Walk with a Doc: Http://www.duncan-williams.com/  -try to include resistance (weight lifting/strength building) and balance exercises twice per week: or the following link for ideas: http://castillo-powell.com/  buyducts.dk  STRESS MANAGEMENT: -can try meditating, or just sitting quietly with deep breathing while intentionally relaxing all parts of your body for 5 minutes daily -if you need further help with stress, anxiety or depression please follow up with your primary doctor or contact the wonderful folks at [facility]: [phone]  SOCIAL CONNECTIONS: -options in [place] if you wish to engage in more social and exercise related activities:  -Silver sneakers https://tools.silversneakers.com  -Walk with a Doc: Http://www.duncan-williams.com/  -Check out the [facility] Active Adults 50+  section on the [place] of [employer] (hiking clubs, book clubs, cards and games, chess, exercise classes, aquatic classes and much more) - see the website for details: https://www.[place].gov/departments/parks-recreation/active-adults50  -YouTube has lots of exercise videos for different ages and abilities as well  -[facility] (a variety of indoor and outdoor inperson activities for adults). [phone]. [address]  -Virtual Online Classes (a variety of topics): see seniorplanet.org or call [phone]  -consider volunteering at a school, hospice center, church, senior center or elsewhere            Michaela JONELLE Cramp, DO

## 2024-06-11 NOTE — Telephone Encounter (Signed)
 PT has been sch for 06-14-2024

## 2024-06-14 ENCOUNTER — Ambulatory Visit: Admitting: Internal Medicine

## 2024-06-14 VITALS — BP 130/80 | HR 85 | Temp 98.1°F | Wt 150.7 lb

## 2024-06-14 DIAGNOSIS — R053 Chronic cough: Secondary | ICD-10-CM | POA: Diagnosis not present

## 2024-06-14 NOTE — Progress Notes (Signed)
 Established Patient Office Visit     CC/Reason for Visit: Continued cough  HPI: Michaela [name] is a 73 y.o. female who is coming in today for the above mentioned reasons.  Cough is now been ongoing since September.  She has significant postnasal drip and has been consistent with her antihistamine and Flonase  usage.  Has had a couple episodes of nosebleeds.  She has lost 11 pounds due to lack of appetite and is a former smoker.  She is not on an ACE inhibitor.  Previous chest x-ray only showed some minimal basilar atelectasis.     Past Medical/Surgical History: Past Medical History:  Diagnosis Date   Allergy    Arthritis    osteoarthritis   Foot deformity    saw podiatrist remotely   Heel spur    Hyperlipidemia    Hypertension    Migraine     Past Surgical History:  Procedure Laterality Date   no prior surgery      Social History:  reports that she quit smoking about 42 years ago. Her smoking use included cigarettes. She has never used smokeless tobacco. She reports current alcohol use of about 1.0 standard drink of alcohol per week. She reports that she does not use drugs.  Allergies: Allergies  Allergen Reactions   Sulfonamide Derivatives Hives    Family History:  Family History  Problem Relation Age of Onset   Heart disease Mother    Stroke Mother    Early death Father        accidental death - trauma   Colon cancer Paternal Uncle 49   Stomach cancer Neg Hx      Current Outpatient Medications:    albuterol  (VENTOLIN  HFA) 108 (90 Base) MCG/ACT inhaler, Inhale 2 puffs into the lungs every 6 (six) hours as needed for wheezing or shortness of breath., Disp: 8 g, Rfl: 2   amLODipine  (NORVASC ) 5 MG tablet, TAKE 1 TABLET BY MOUTH DAILY, Disp: 90 tablet, Rfl: 1   atorvastatin  (LIPITOR) 40 MG tablet, TAKE 1 TABLET BY MOUTH DAILY AT 6 P.M., Disp: 90 tablet, Rfl: 1   benzonatate  (TESSALON ) 100 MG capsule, Take 1 capsule (100 mg total) by mouth 2 (two) times daily  as needed for cough., Disp: 20 capsule, Rfl: 0   cholecalciferol (VITAMIN D3) 25 MCG (1000 UNIT) tablet, Take 1,000 Units by mouth daily., Disp: , Rfl:    docusate sodium (COLACE) 100 MG capsule, Take 100 mg by mouth 2 (two) times daily., Disp: , Rfl:    fexofenadine (ALLEGRA) 180 MG tablet, Take 180 mg daily as needed by mouth for allergies or rhinitis., Disp: , Rfl:    fluticasone  (FLONASE ) 50 MCG/ACT nasal spray, USE 1 OR 2 SPRAYS INTO EACH NOSTRIL ONCE DAILY *SHAKE WELL BEFORE EACH USE*, Disp: 16 mL, Rfl: 2   Krill Oil 1000 MG CAPS, Take by mouth., Disp: , Rfl:    losartan  (COZAAR ) 100 MG tablet, TAKE 1 TABLET BY MOUTH DAILY, Disp: 90 tablet, Rfl: 1   tretinoin  (RETIN-A ) 0.025 % cream, APPLY ONCE DAILY TO FACE AS DIRECTED, Disp: 45 g, Rfl: 3  Review of Systems:  Negative unless indicated in HPI.   Physical Exam: Vitals:   06/14/24 1426  BP: 130/80  Pulse: 85  Temp: 98.1 F (36.7 C)  TempSrc: Oral  SpO2: 96%  Weight: 150 lb 11.2 oz (68.4 kg)    Body mass index is 28.47 kg/m.   Physical Exam Vitals reviewed.  Constitutional:  Appearance: Normal appearance.  HENT:     Head: Normocephalic and atraumatic.  Eyes:     Conjunctiva/sclera: Conjunctivae normal.  Cardiovascular:     Rate and Rhythm: Normal rate and regular rhythm.  Pulmonary:     Effort: Pulmonary effort is normal.     Breath sounds: Normal breath sounds.  Skin:    General: Skin is warm and dry.  Neurological:     General: No focal deficit present.     Mental Status: She is alert and oriented to person, place, and time.  Psychiatric:        Mood and Affect: Mood normal.        Behavior: Behavior normal.        Thought Content: Thought content normal.        Judgment: Judgment normal.      Impression and Plan:  Chronic cough -     CT CHEST W CONTRAST; Future  - Given chronicity of cough and previous smoking history will send for CT chest for reassurance.  Continue with antihistamine and Flonase   as well as cool-mist humidifier's.   Time spent:31 minutes reviewing chart, interviewing and examining patient and formulating plan of care.     Tully Theophilus Andrews, MD [facility]

## 2024-07-05 ENCOUNTER — Ambulatory Visit (HOSPITAL_BASED_OUTPATIENT_CLINIC_OR_DEPARTMENT_OTHER)
Admission: RE | Admit: 2024-07-05 | Discharge: 2024-07-05 | Disposition: A | Source: Ambulatory Visit | Attending: Internal Medicine | Admitting: Internal Medicine

## 2024-07-05 DIAGNOSIS — J9811 Atelectasis: Secondary | ICD-10-CM | POA: Diagnosis not present

## 2024-07-05 DIAGNOSIS — H43813 Vitreous degeneration, bilateral: Secondary | ICD-10-CM | POA: Diagnosis not present

## 2024-07-05 DIAGNOSIS — H35033 Hypertensive retinopathy, bilateral: Secondary | ICD-10-CM | POA: Diagnosis not present

## 2024-07-05 DIAGNOSIS — H2513 Age-related nuclear cataract, bilateral: Secondary | ICD-10-CM | POA: Diagnosis not present

## 2024-07-05 DIAGNOSIS — H43393 Other vitreous opacities, bilateral: Secondary | ICD-10-CM | POA: Diagnosis not present

## 2024-07-05 DIAGNOSIS — R053 Chronic cough: Secondary | ICD-10-CM | POA: Insufficient documentation

## 2024-07-05 DIAGNOSIS — H43822 Vitreomacular adhesion, left eye: Secondary | ICD-10-CM | POA: Diagnosis not present

## 2024-07-05 DIAGNOSIS — D3131 Benign neoplasm of right choroid: Secondary | ICD-10-CM | POA: Diagnosis not present

## 2024-07-05 MED ORDER — IOHEXOL 300 MG/ML  SOLN
100.0000 mL | Freq: Once | INTRAMUSCULAR | Status: AC | PRN
  Administered 2024-07-05: 75 mL via INTRAVENOUS

## 2024-07-06 ENCOUNTER — Ambulatory Visit: Payer: Self-pay | Admitting: Internal Medicine

## 2024-07-06 ENCOUNTER — Ambulatory Visit: Admitting: Internal Medicine

## 2024-07-06 ENCOUNTER — Encounter: Payer: Self-pay | Admitting: Internal Medicine

## 2024-07-06 VITALS — BP 164/82 | HR 70 | Temp 98.2°F | Ht 62.25 in | Wt 151.3 lb

## 2024-07-06 DIAGNOSIS — I10 Essential (primary) hypertension: Secondary | ICD-10-CM

## 2024-07-06 DIAGNOSIS — Z780 Asymptomatic menopausal state: Secondary | ICD-10-CM | POA: Diagnosis not present

## 2024-07-06 DIAGNOSIS — Z85828 Personal history of other malignant neoplasm of skin: Secondary | ICD-10-CM | POA: Diagnosis not present

## 2024-07-06 DIAGNOSIS — E559 Vitamin D deficiency, unspecified: Secondary | ICD-10-CM

## 2024-07-06 DIAGNOSIS — Z0000 Encounter for general adult medical examination without abnormal findings: Secondary | ICD-10-CM

## 2024-07-06 DIAGNOSIS — L814 Other melanin hyperpigmentation: Secondary | ICD-10-CM | POA: Diagnosis not present

## 2024-07-06 DIAGNOSIS — Z08 Encounter for follow-up examination after completed treatment for malignant neoplasm: Secondary | ICD-10-CM | POA: Diagnosis not present

## 2024-07-06 DIAGNOSIS — Z13820 Encounter for screening for osteoporosis: Secondary | ICD-10-CM | POA: Diagnosis not present

## 2024-07-06 DIAGNOSIS — E785 Hyperlipidemia, unspecified: Secondary | ICD-10-CM

## 2024-07-06 DIAGNOSIS — L821 Other seborrheic keratosis: Secondary | ICD-10-CM | POA: Diagnosis not present

## 2024-07-06 LAB — CBC WITH DIFFERENTIAL/PLATELET
Basophils Absolute: 0 K/uL (ref 0.0–0.1)
Basophils Relative: 0.4 % (ref 0.0–3.0)
Eosinophils Absolute: 0.1 K/uL (ref 0.0–0.7)
Eosinophils Relative: 1.5 % (ref 0.0–5.0)
HCT: 37.2 % (ref 36.0–46.0)
Hemoglobin: 12.4 g/dL (ref 12.0–15.0)
Lymphocytes Relative: 15.5 % (ref 12.0–46.0)
Lymphs Abs: 1.1 K/uL (ref 0.7–4.0)
MCHC: 33.3 g/dL (ref 30.0–36.0)
MCV: 86.4 fl (ref 78.0–100.0)
Monocytes Absolute: 0.5 K/uL (ref 0.1–1.0)
Monocytes Relative: 7.5 % (ref 3.0–12.0)
Neutro Abs: 5.3 K/uL (ref 1.4–7.7)
Neutrophils Relative %: 75.1 % (ref 43.0–77.0)
Platelets: 259 K/uL (ref 150.0–400.0)
RBC: 4.31 Mil/uL (ref 3.87–5.11)
RDW: 14.7 % (ref 11.5–15.5)
WBC: 7 K/uL (ref 4.0–10.5)

## 2024-07-06 LAB — COMPREHENSIVE METABOLIC PANEL WITH GFR
ALT: 15 U/L (ref 0–35)
AST: 20 U/L (ref 0–37)
Albumin: 5 g/dL (ref 3.5–5.2)
Alkaline Phosphatase: 56 U/L (ref 39–117)
BUN: 15 mg/dL (ref 6–23)
CO2: 28 meq/L (ref 19–32)
Calcium: 9.9 mg/dL (ref 8.4–10.5)
Chloride: 102 meq/L (ref 96–112)
Creatinine, Ser: 0.86 mg/dL (ref 0.40–1.20)
GFR: 67.19 mL/min (ref >60.00)
Glucose, Bld: 80 mg/dL (ref 70–99)
Potassium: 4.2 meq/L (ref 3.5–5.1)
Sodium: 141 meq/L (ref 135–145)
Total Bilirubin: 0.7 mg/dL (ref 0.2–1.2)
Total Protein: 7.3 g/dL (ref 6.0–8.3)

## 2024-07-06 LAB — VITAMIN D 25 HYDROXY (VIT D DEFICIENCY, FRACTURES): VITD: 34.16 ng/mL (ref 30.00–100.00)

## 2024-07-06 LAB — LIPID PANEL
Cholesterol: 226 mg/dL — ABNORMAL HIGH (ref 0–200)
HDL: 64.7 mg/dL (ref >39.00)
LDL Cholesterol: 139 mg/dL — ABNORMAL HIGH (ref 0–99)
NonHDL: 161.09
Total CHOL/HDL Ratio: 3
Triglycerides: 111 mg/dL (ref 0.0–149.0)
VLDL: 22.2 mg/dL (ref 0.0–40.0)

## 2024-07-06 LAB — TSH: TSH: 2.21 u[IU]/mL (ref 0.35–5.50)

## 2024-07-06 LAB — VITAMIN B12: Vitamin B-12: 302 pg/mL (ref 211–911)

## 2024-07-06 NOTE — Progress Notes (Signed)
 Established Patient Office Visit     CC/Reason for Visit: Annual preventive exam  HPI: Michaela [name] is a 73 y.o. female who is coming in today for the above mentioned reasons. Past Medical History is significant for: Hypertension, hyperlipidemia, vitamin D  deficiency.  She just had a CT scan that has not resulted yet due to weight loss and a chronic cough.  She states cough is improving.  She has routine eye and dental care.  Immunizations are up-to-date.  Cancer screening is up-to-date.  She is due to update her bone density.   Past Medical/Surgical History: Past Medical History:  Diagnosis Date   Allergy    Arthritis    osteoarthritis   Foot deformity    saw podiatrist remotely   Heel spur    Hyperlipidemia    Hypertension    Migraine     Past Surgical History:  Procedure Laterality Date   no prior surgery      Social History:  reports that she quit smoking about 42 years ago. Her smoking use included cigarettes. She has never used smokeless tobacco. She reports current alcohol use of about 1.0 standard drink of alcohol per week. She reports that she does not use drugs.  Allergies: Allergies  Allergen Reactions   Sulfonamide Derivatives Hives    Family History:  Family History  Problem Relation Age of Onset   Heart disease Mother    Stroke Mother    Early death Father        accidental death - trauma   Colon cancer Paternal Uncle 64   Stomach cancer Neg Hx      Current Outpatient Medications:    amLODipine  (NORVASC ) 5 MG tablet, TAKE 1 TABLET BY MOUTH DAILY, Disp: 90 tablet, Rfl: 1   atorvastatin  (LIPITOR) 40 MG tablet, TAKE 1 TABLET BY MOUTH DAILY AT 6 P.M., Disp: 90 tablet, Rfl: 1   cholecalciferol (VITAMIN D3) 25 MCG (1000 UNIT) tablet, Take 1,000 Units by mouth daily., Disp: , Rfl:    docusate sodium (COLACE) 100 MG capsule, Take 100 mg by mouth 2 (two) times daily., Disp: , Rfl:    fexofenadine (ALLEGRA) 180 MG tablet, Take 180 mg daily as needed  by mouth for allergies or rhinitis., Disp: , Rfl:    fluticasone  (FLONASE ) 50 MCG/ACT nasal spray, USE 1 OR 2 SPRAYS INTO EACH NOSTRIL ONCE DAILY *SHAKE WELL BEFORE EACH USE*, Disp: 16 mL, Rfl: 2   Krill Oil 1000 MG CAPS, Take by mouth., Disp: , Rfl:    losartan  (COZAAR ) 100 MG tablet, TAKE 1 TABLET BY MOUTH DAILY, Disp: 90 tablet, Rfl: 1   tretinoin  (RETIN-A ) 0.025 % cream, APPLY ONCE DAILY TO FACE AS DIRECTED, Disp: 45 g, Rfl: 3   albuterol  (VENTOLIN  HFA) 108 (90 Base) MCG/ACT inhaler, Inhale 2 puffs into the lungs every 6 (six) hours as needed for wheezing or shortness of breath., Disp: 8 g, Rfl: 2   benzonatate  (TESSALON ) 100 MG capsule, Take 1 capsule (100 mg total) by mouth 2 (two) times daily as needed for cough., Disp: 20 capsule, Rfl: 0  Review of Systems:  Negative unless indicated in HPI.   Physical Exam: Vitals:   07/06/24 0914 07/06/24 0917  BP: (!) 160/90 (!) 164/82  Pulse: 70   Temp: 98.2 F (36.8 C)   TempSrc: Oral   SpO2: 98%   Weight: 151 lb 4.8 oz (68.6 kg)   Height: 5' 2.25 (1.581 m)     Body mass index  is 27.45 kg/m.   Physical Exam Vitals reviewed.  Constitutional:      General: She is not in acute distress.    Appearance: Normal appearance. She is not ill-appearing, toxic-appearing or diaphoretic.  HENT:     Head: Normocephalic.     Right Ear: Tympanic membrane, ear canal and external ear normal. There is no impacted cerumen.     Left Ear: Tympanic membrane, ear canal and external ear normal. There is no impacted cerumen.     Nose: Nose normal.     Mouth/Throat:     Mouth: Mucous membranes are moist.     Pharynx: Oropharynx is clear. No oropharyngeal exudate or posterior oropharyngeal erythema.  Eyes:     General: No scleral icterus.       Right eye: No discharge.        Left eye: No discharge.     Conjunctiva/sclera: Conjunctivae normal.     Pupils: Pupils are equal, round, and reactive to light.  Neck:     Vascular: No carotid bruit.   Cardiovascular:     Rate and Rhythm: Normal rate and regular rhythm.     Pulses: Normal pulses.     Heart sounds: Normal heart sounds.  Pulmonary:     Effort: Pulmonary effort is normal. No respiratory distress.     Breath sounds: Normal breath sounds.  Abdominal:     General: Abdomen is flat. Bowel sounds are normal.     Palpations: Abdomen is soft.  Musculoskeletal:        General: Normal range of motion.     Cervical back: Normal range of motion.  Skin:    General: Skin is warm and dry.  Neurological:     General: No focal deficit present.     Mental Status: She is alert and oriented to person, place, and time. Mental status is at baseline.  Psychiatric:        Mood and Affect: Mood normal.        Behavior: Behavior normal.        Thought Content: Thought content normal.        Judgment: Judgment normal.        Impression and Plan:  Encounter for preventive health examination  Hyperlipidemia, unspecified hyperlipidemia type -     Lipid panel; Future  Vitamin D  deficiency -     VITAMIN D  25 Hydroxy (Vit-D Deficiency, Fractures); Future  Essential hypertension -     CBC with Differential/Platelet; Future -     Comprehensive metabolic panel with GFR; Future  Screening for osteoporosis  Postmenopausal estrogen deficiency -     TSH; Future -     Vitamin B12; Future -     DG Bone Density; Future   -Recommend routine eye and dental care. -Healthy lifestyle discussed in detail. -Labs to be updated today. -Prostate cancer screening: Not applicable Health Maintenance  Topic Date Due   Osteoporosis screening with Bone Density Scan  06/18/2024   COVID-19 Vaccine (9 - 2025-26 season) 06/25/2024   Breast Cancer Screening  12/21/2024   Medicare Annual Wellness Visit  06/10/2025   DTaP/Tdap/Td vaccine (4 - Td or Tdap) 02/14/2026   Cologuard (Stool DNA test)  06/06/2026   Pneumococcal Vaccine for age over 64  Completed   Flu Shot  Completed   Hepatitis C Screening   Completed   Zoster (Shingles) Vaccine  Completed   Meningitis B Vaccine  Aged Out   Colon Cancer Screening  Discontinued     -  DEXA requested. - She will monitor her blood pressure at home and report back in a few weeks.  Blood pressure has been normal in the past.    Tully Theophilus Andrews, MD [facility]

## 2024-07-12 ENCOUNTER — Ambulatory Visit: Payer: Self-pay | Admitting: Internal Medicine

## 2024-07-30 ENCOUNTER — Other Ambulatory Visit: Payer: Self-pay | Admitting: Internal Medicine

## 2024-07-30 DIAGNOSIS — I10 Essential (primary) hypertension: Secondary | ICD-10-CM

## 2024-08-14 ENCOUNTER — Other Ambulatory Visit: Payer: Self-pay | Admitting: Internal Medicine

## 2024-08-14 DIAGNOSIS — E785 Hyperlipidemia, unspecified: Secondary | ICD-10-CM

## 2024-08-18 ENCOUNTER — Ambulatory Visit (INDEPENDENT_AMBULATORY_CARE_PROVIDER_SITE_OTHER)
Admission: RE | Admit: 2024-08-18 | Discharge: 2024-08-18 | Disposition: A | Discharge status: Home / Self Care | Source: Ambulatory Visit | Attending: Internal Medicine | Admitting: Internal Medicine

## 2024-08-18 DIAGNOSIS — Z780 Asymptomatic menopausal state: Secondary | ICD-10-CM | POA: Diagnosis not present

## 2024-08-28 ENCOUNTER — Other Ambulatory Visit: Payer: Self-pay | Admitting: Internal Medicine

## 2024-08-28 DIAGNOSIS — I10 Essential (primary) hypertension: Secondary | ICD-10-CM

## 2025-01-05 ENCOUNTER — Ambulatory Visit: Admitting: Internal Medicine
